# Patient Record
Sex: Female | Born: 1980 | Race: White | Hispanic: No | Marital: Married | Smoking: Current every day smoker
Health system: Southern US, Community
[De-identification: ages and names within clinical notes are randomized; demographics above are authoritative.]

## PROBLEM LIST (undated history)

## (undated) ENCOUNTER — Ambulatory Visit: Admission: EM | Source: Home / Self Care

## (undated) DIAGNOSIS — I509 Heart failure, unspecified: Secondary | ICD-10-CM

## (undated) DIAGNOSIS — J80 Acute respiratory distress syndrome: Secondary | ICD-10-CM

## (undated) DIAGNOSIS — J189 Pneumonia, unspecified organism: Secondary | ICD-10-CM

## (undated) DIAGNOSIS — A419 Sepsis, unspecified organism: Secondary | ICD-10-CM

## (undated) DIAGNOSIS — J9601 Acute respiratory failure with hypoxia: Secondary | ICD-10-CM

## (undated) HISTORY — DX: Acute respiratory failure with hypoxia: J96.01

## (undated) HISTORY — DX: Heart failure, unspecified: I50.9

## (undated) HISTORY — PX: NO PAST SURGERIES: SHX2092

## (undated) HISTORY — DX: Acute respiratory distress syndrome: J80

## (undated) HISTORY — DX: Pneumonia, unspecified organism: J18.9

## (undated) HISTORY — DX: Sepsis, unspecified organism: A41.9

---
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
## 2005-08-08 ENCOUNTER — Emergency Department (HOSPITAL_COMMUNITY): Admission: EM | Admit: 2005-08-08 | Discharge: 2005-08-08 | Payer: Self-pay | Admitting: Emergency Medicine

## 2006-04-11 ENCOUNTER — Emergency Department (HOSPITAL_COMMUNITY): Admission: EM | Admit: 2006-04-11 | Discharge: 2006-04-12 | Payer: Self-pay | Admitting: Emergency Medicine

## 2009-05-01 ENCOUNTER — Emergency Department (HOSPITAL_COMMUNITY): Admission: EM | Admit: 2009-05-01 | Discharge: 2009-05-01 | Payer: Self-pay | Admitting: Family Medicine

## 2009-07-04 ENCOUNTER — Emergency Department (HOSPITAL_COMMUNITY): Admission: EM | Admit: 2009-07-04 | Discharge: 2009-07-05 | Payer: Self-pay | Admitting: Emergency Medicine

## 2010-05-13 LAB — URINALYSIS, ROUTINE W REFLEX MICROSCOPIC
Bilirubin Urine: NEGATIVE
Ketones, ur: NEGATIVE mg/dL
Protein, ur: NEGATIVE mg/dL
Specific Gravity, Urine: 1.004 — ABNORMAL LOW (ref 1.005–1.030)
pH: 6 (ref 5.0–8.0)

## 2010-05-13 LAB — RAPID URINE DRUG SCREEN, HOSP PERFORMED
Amphetamines: NOT DETECTED
Benzodiazepines: NOT DETECTED
Opiates: NOT DETECTED
Tetrahydrocannabinol: NOT DETECTED

## 2010-05-13 LAB — TYPE AND SCREEN
ABO/RH(D): A POS
Antibody Screen: NEGATIVE

## 2010-05-13 LAB — PROTIME-INR
INR: 0.93 (ref 0.00–1.49)
Prothrombin Time: 12.4 seconds (ref 11.6–15.2)

## 2010-05-13 LAB — COMPREHENSIVE METABOLIC PANEL
Alkaline Phosphatase: 104 U/L (ref 39–117)
Calcium: 9.3 mg/dL (ref 8.4–10.5)
GFR calc Af Amer: >60 mL/min (ref >60)
Potassium: 3.2 mEq/L — ABNORMAL LOW (ref 3.5–5.1)
Total Bilirubin: 0.5 mg/dL (ref 0.3–1.2)
Total Protein: 7.6 g/dL (ref 6.0–8.3)

## 2010-05-13 LAB — CBC
Hemoglobin: 15.5 g/dL — ABNORMAL HIGH (ref 12.0–15.0)
MCV: 93.2 fL (ref 78.0–100.0)
Platelets: 263 10*3/uL (ref 150–400)
RDW: 13.3 % (ref 11.5–15.5)
WBC: 12.3 10*3/uL — ABNORMAL HIGH (ref 4.0–10.5)

## 2010-05-13 LAB — POCT I-STAT, CHEM 8
Calcium, Ion: 1.05 mmol/L — ABNORMAL LOW (ref 1.12–1.32)
Hemoglobin: 16.3 g/dL — ABNORMAL HIGH (ref 12.0–15.0)
Potassium: 3.3 mEq/L — ABNORMAL LOW (ref 3.5–5.1)

## 2010-05-13 LAB — DIFFERENTIAL
Basophils Relative: 0 % (ref 0–1)
Eosinophils Relative: 1 % (ref 0–5)
Lymphocytes Relative: 18 % (ref 12–46)
Lymphs Abs: 2.2 10*3/uL (ref 0.7–4.0)
Neutrophils Relative %: 74 % (ref 43–77)

## 2010-05-13 LAB — ETHANOL: Alcohol, Ethyl (B): 93 mg/dL — ABNORMAL HIGH (ref 0–10)

## 2010-05-13 LAB — ABO/RH: ABO/RH(D): A POS

## 2010-11-11 ENCOUNTER — Emergency Department (HOSPITAL_COMMUNITY)
Admission: EM | Admit: 2010-11-11 | Discharge: 2010-11-11 | Disposition: A | Discharge status: Home / Self Care | Payer: Self-pay | Attending: Emergency Medicine | Admitting: Emergency Medicine

## 2010-11-11 DIAGNOSIS — R21 Rash and other nonspecific skin eruption: Secondary | ICD-10-CM | POA: Insufficient documentation

## 2010-11-11 DIAGNOSIS — K089 Disorder of teeth and supporting structures, unspecified: Secondary | ICD-10-CM | POA: Insufficient documentation

## 2011-12-21 ENCOUNTER — Emergency Department (HOSPITAL_COMMUNITY)
Admission: EM | Admit: 2011-12-21 | Discharge: 2011-12-21 | Disposition: A | Discharge status: Home / Self Care | Payer: Self-pay | Attending: Emergency Medicine | Admitting: Emergency Medicine

## 2011-12-21 ENCOUNTER — Encounter (HOSPITAL_COMMUNITY): Payer: Self-pay | Admitting: Emergency Medicine

## 2011-12-21 DIAGNOSIS — F17200 Nicotine dependence, unspecified, uncomplicated: Secondary | ICD-10-CM | POA: Insufficient documentation

## 2011-12-21 DIAGNOSIS — K029 Dental caries, unspecified: Secondary | ICD-10-CM | POA: Insufficient documentation

## 2011-12-21 DIAGNOSIS — K089 Disorder of teeth and supporting structures, unspecified: Secondary | ICD-10-CM | POA: Insufficient documentation

## 2011-12-21 DIAGNOSIS — K0889 Other specified disorders of teeth and supporting structures: Secondary | ICD-10-CM

## 2011-12-21 MED ORDER — OXYCODONE-ACETAMINOPHEN 5-325 MG PO TABS
2.0000 | ORAL_TABLET | Freq: Once | ORAL | Status: AC
  Administered 2011-12-21: 2 via ORAL
  Filled 2011-12-21: qty 2

## 2011-12-21 MED ORDER — OXYCODONE-ACETAMINOPHEN 5-325 MG PO TABS: 1.0000 | ORAL_TABLET | Freq: Four times a day (QID) | ORAL | Status: DC | PRN

## 2011-12-21 MED ORDER — PENICILLIN V POTASSIUM 500 MG PO TABS: 500.0000 mg | ORAL_TABLET | Freq: Four times a day (QID) | ORAL | Status: AC

## 2011-12-21 NOTE — ED Provider Notes (Signed)
History  This chart was scribed for non-physician practitioner working with Juliet Rude. Rubin Payor, MD by Ardeen Jourdain. This patient was seen in room WTR8/WTR8 and the patient's care was started at 1429.  CSN: [id number]  Arrival date & time 12/21/11  1401   First MD Initiated Contact with Patient 12/21/11 1429      Chief Complaint  Patient presents with  . Dental Pain    The history is provided by the patient. No language interpreter was used.    English Catherine Cannon is a 31 y.o. female who presents to the Emergency Department complaining of dental pain on the left side and upper jaw of her mouth that started 3 weeks ago. She states that the pain radiates to her head and neck. She also states that she had a tooth fall out a few weeks ago. She denies taking any medication for the pain, which she rates at 8-9. She is a current everyday smoker and occasional alcohol user.   History reviewed. No pertinent past medical history.  History reviewed. No pertinent past surgical history.  History reviewed. No pertinent family history.  History  Substance Use Topics  . Smoking status: Current Every Day Smoker  . Smokeless tobacco: Not on file  . Alcohol Use: Yes   No OB History available.   Review of Systems  Constitutional: Negative for fever and chills.  HENT: Positive for dental problem.     Allergies  Review of patient's allergies indicates no known allergies.  Home Medications   Current Outpatient Rx  Name Route Sig Dispense Refill  . IBUPROFEN 200 MG PO TABS Oral Take 800 mg by mouth every 6 (six) hours as needed.      Triage Vitals: BP 138/87  Pulse 78  Temp 98.5 F (36.9 C) (Oral)  Resp 18  SpO2 95%  LMP 11/23/2011  Physical Exam  Nursing note and vitals reviewed. Constitutional: She is oriented to person, place, and time. She appears well-developed and well-nourished. No distress.  HENT:  Head: Normocephalic and atraumatic. No trismus in the jaw.  Mouth/Throat:  Oropharynx is clear and moist. Abnormal dentition. Dental caries present. No dental abscesses or uvula swelling. No oropharyngeal exudate.         No oropharyngeal swelling, uvula midline, no swelling under the tongue.  Eyes: EOM are normal. Pupils are equal, round, and reactive to light.  Neck: Normal range of motion. Neck supple. No tracheal deviation present.  Cardiovascular: Normal rate, regular rhythm and normal heart sounds.   Pulmonary/Chest: Effort normal and breath sounds normal. No respiratory distress.  Abdominal: Soft. Bowel sounds are normal. She exhibits no distension. There is no tenderness.  Musculoskeletal: Normal range of motion. She exhibits no edema.  Neurological: She is alert and oriented to person, place, and time.  Skin: Skin is warm and dry.  Psychiatric: She has a normal mood and affect. Her behavior is normal.    ED Course  Procedures (including critical care time) DIAGNOSTIC STUDIES: Oxygen Saturation is 95% on room air, adequate by my interpretation.    COORDINATION OF CARE:  3:20 PM: Discussed treatment plan which includes consultation with the dentist on call with pt at bedside and pt agreed to plan.    Labs Reviewed - No data to display No results found.   1. Pain, dental       MDM  Patient presented with dental pain. Patient given pain medication with improvement. Patient discharged on penicillin, pain meds, and given referral to dentist on call.  Return precautions given. No red flags for Ludwig's angina.            Pixie Casino, PA-C 12/21/11 1733

## 2011-12-21 NOTE — ED Notes (Signed)
Pt verbalizes understanding 

## 2011-12-21 NOTE — ED Notes (Signed)
Pt c/o pain to 2 left upper molars for several months.  Pt does not have a dentist.  Pt has not taken anything for pain.  No facial swelling noted.

## 2011-12-21 NOTE — Progress Notes (Signed)
CM spoke with pt who confirms self pay [place] resident with no pcp. Discussed the importance of a pcp for f/u. Reviewed Health connect number to assist with finding self pay provider close to pt's residence. Reviewed resources for [facility], general medical clinics, medications-needymeds.com, [facility] out [facility], housing, DSS, health Department and other resources in [employer] [employer]. Pt voiced understanding and appreciation of resources provided

## 2011-12-22 NOTE — ED Provider Notes (Signed)
Medical screening examination/treatment/procedure(s) were performed by non-physician practitioner and as supervising physician I was immediately available for consultation/collaboration  Omarian Jaquith R. Viviano Bir, MD 12/22/11 0012 

## 2012-09-15 ENCOUNTER — Emergency Department (HOSPITAL_COMMUNITY)
Admission: EM | Admit: 2012-09-15 | Discharge: 2012-09-15 | Disposition: A | Discharge status: Home / Self Care | Payer: Self-pay | Attending: Emergency Medicine | Admitting: Emergency Medicine

## 2012-09-15 ENCOUNTER — Encounter (HOSPITAL_COMMUNITY): Payer: Self-pay | Admitting: Emergency Medicine

## 2012-09-15 DIAGNOSIS — R220 Localized swelling, mass and lump, head: Secondary | ICD-10-CM | POA: Insufficient documentation

## 2012-09-15 DIAGNOSIS — K047 Periapical abscess without sinus: Secondary | ICD-10-CM | POA: Insufficient documentation

## 2012-09-15 DIAGNOSIS — F17200 Nicotine dependence, unspecified, uncomplicated: Secondary | ICD-10-CM | POA: Insufficient documentation

## 2012-09-15 MED ORDER — OXYCODONE-ACETAMINOPHEN 5-325 MG PO TABS: 1.0000 | ORAL_TABLET | Freq: Four times a day (QID) | ORAL | Status: DC | PRN

## 2012-09-15 MED ORDER — OXYCODONE-ACETAMINOPHEN 5-325 MG PO TABS
1.0000 | ORAL_TABLET | Freq: Once | ORAL | Status: AC
  Administered 2012-09-15: 1 via ORAL
  Filled 2012-09-15: qty 1

## 2012-09-15 MED ORDER — PENICILLIN V POTASSIUM 500 MG PO TABS: 500.0000 mg | ORAL_TABLET | Freq: Three times a day (TID) | ORAL | Status: DC

## 2012-09-15 MED ORDER — IBUPROFEN 800 MG PO TABS: 800.0000 mg | ORAL_TABLET | Freq: Three times a day (TID) | ORAL | Status: DC

## 2012-09-15 NOTE — ED Provider Notes (Signed)
  CSN: [id number]     Arrival date & time 09/15/12  1553 History    This chart was scribed for non-physician practitioner Arnoldo Hooker PA-C working with Flint Melter, MD by Donne Anon, ED Scribe. This patient was seen in room WTR5/WTR5 and the patient's care was started at 1607.    First MD Initiated Contact with Patient 09/15/12 1607     No chief complaint on file.   The history is provided by the patient. No language interpreter was used.   HPI Comments: Catherine Cannon is a 32 y.o. female who presents to the Emergency Department complaining of 4 days of gradual onset, gradually worsening left sided dental pain and swelling. She states the tooth has been broken, but she has not sought dental care due to lack of insurance. She has tried ibuprofen with little relief. She denies fever or any other pain or injury.   No past medical history on file. No past surgical history on file. No family history on file. History  Substance Use Topics  . Smoking status: Current Every Day Smoker  . Smokeless tobacco: Not on file  . Alcohol Use: Yes   OB History   Grav Para Term Preterm Abortions TAB SAB Ect Mult Living                 Review of Systems  Constitutional: Negative for fever.  HENT: Positive for dental problem. Negative for drooling and trouble swallowing.   All other systems reviewed and are negative.    Allergies  Review of patient's allergies indicates no known allergies.  Home Medications   Current Outpatient Rx  Name  Route  Sig  Dispense  Refill  . ibuprofen (ADVIL,MOTRIN) 200 MG tablet   Oral   Take 800 mg by mouth every 6 (six) hours as needed.         Marland Kitchen oxyCODONE-acetaminophen (PERCOCET) 5-325 MG per tablet   Oral   Take 1 tablet by mouth every 6 (six) hours as needed for pain.   15 tablet   0    There were no vitals taken for this visit.  Physical Exam  Nursing note and vitals reviewed. Constitutional: She appears well-developed and  well-nourished. No distress.  HENT:  Head: Normocephalic and atraumatic.  Mouth/Throat: Oropharynx is clear and moist.  Severe erosion to mid upper left molar with moderate decay of remainder teeth. Left sided facial swelling. No discrete abscess visualized. No submental lymphadenopathy.   Eyes: Conjunctivae are normal.  Neck: Neck supple. No tracheal deviation present.  Cardiovascular: Normal rate.   Pulmonary/Chest: Effort normal. No respiratory distress.  Musculoskeletal: Normal range of motion.  Neurological: She is alert.  Skin: Skin is warm and dry.  Psychiatric: She has a normal mood and affect. Her behavior is normal.    ED Course   Procedures (including critical care time) DIAGNOSTIC STUDIES: .    COORDINATION OF CARE: 4:14 PM Discussed treatment plan which includes pain medication with pt at bedside and pt agreed to plan. Dental referral given. Advised pt to use warm compresses and ibuprofen.     Labs Reviewed - No data to display No results found. No diagnosis found. 1. Dental abscess MDM  Facial swelling and molar pain c/w dental abscess.  I personally performed the services described in this documentation, which was scribed in my presence. The recorded information has been reviewed and is accurate.     Arnoldo Hooker, PA-C 09/15/12 1623

## 2012-09-15 NOTE — ED Provider Notes (Signed)
Medical screening examination/treatment/procedure(s) were performed by non-physician practitioner and as supervising physician I was immediately available for consultation/collaboration.  Katia Hannen L Keshara Kiger, MD 09/15/12 2341 

## 2012-09-15 NOTE — ED Notes (Signed)
4 day hx of dental pain up upper l/side of mouth

## 2013-02-24 ENCOUNTER — Encounter (HOSPITAL_COMMUNITY): Payer: Self-pay | Admitting: Emergency Medicine

## 2013-02-24 ENCOUNTER — Emergency Department (HOSPITAL_COMMUNITY)
Admission: EM | Admit: 2013-02-24 | Discharge: 2013-02-24 | Disposition: A | Discharge status: Home / Self Care | Payer: Self-pay | Attending: Emergency Medicine | Admitting: Emergency Medicine

## 2013-02-24 DIAGNOSIS — R6889 Other general symptoms and signs: Secondary | ICD-10-CM

## 2013-02-24 DIAGNOSIS — Z79899 Other long term (current) drug therapy: Secondary | ICD-10-CM | POA: Insufficient documentation

## 2013-02-24 DIAGNOSIS — F17200 Nicotine dependence, unspecified, uncomplicated: Secondary | ICD-10-CM | POA: Insufficient documentation

## 2013-02-24 DIAGNOSIS — J111 Influenza due to unidentified influenza virus with other respiratory manifestations: Secondary | ICD-10-CM | POA: Insufficient documentation

## 2013-02-24 LAB — RAPID STREP SCREEN (MED CTR MEBANE ONLY): STREPTOCOCCUS, GROUP A SCREEN (DIRECT): NEGATIVE

## 2013-02-24 MED ORDER — TRAMADOL HCL 50 MG PO TABS: 50.0000 mg | ORAL_TABLET | Freq: Four times a day (QID) | ORAL | Status: DC | PRN

## 2013-02-24 MED ORDER — DEXTROMETHORPHAN POLISTIREX 30 MG/5ML PO LQCR: 15.0000 mg | Freq: Two times a day (BID) | ORAL | Status: DC

## 2013-02-24 NOTE — ED Provider Notes (Signed)
Medical screening examination/treatment/procedure(s) were performed by non-physician practitioner and as supervising physician I was immediately available for consultation/collaboration.  EKG Interpretation   None         Houston Siren, MD 02/24/13 1704

## 2013-02-24 NOTE — Discharge Instructions (Signed)

## 2013-02-24 NOTE — ED Provider Notes (Signed)
CSN: [id number]     Arrival date & time 02/24/13  1334 History  This chart was scribed for non-physician practitioner, Devona Konig, working with Houston Siren, MD by Celesta Gentile, ED Scribe. This patient was seen in room WTR6/WTR6 and the patient's care was started at 2:15 PM.    Chief Complaint  Patient presents with  . Headache  . Chills  . Cough  . Sore Throat    x 1 week   The history is provided by the patient. No language interpreter was used.   HPI Comments: Catherine Cannon is a 33 y.o. female who presents to the Emergency Department complaining of worsening flu-like symptoms that started about 7 days ago.  She states she has a constant headache, subjective fever, chills, non productive cough, sore throat, and nasal congestion.  She states she did not receive a flu shot this year.  She has tried DayQuil, but denies trying OTC nasal sprays.  Pt reports she had a sick contact at work.  History reviewed. No pertinent past medical history. History reviewed. No pertinent past surgical history. History reviewed. No pertinent family history. History  Substance Use Topics  . Smoking status: Current Every Day Smoker    Types: Cigarettes  . Smokeless tobacco: Not on file  . Alcohol Use: Yes   OB History   Grav Para Term Preterm Abortions TAB SAB Ect Mult Living                 Review of Systems  Constitutional: Positive for fever and chills.  HENT: Positive for congestion, sinus pressure and sore throat. Negative for rhinorrhea.   Respiratory: Positive for cough. Negative for shortness of breath.   Cardiovascular: Negative for chest pain.  Gastrointestinal: Negative for nausea, vomiting, abdominal pain and diarrhea.  Musculoskeletal: Negative for back pain.  Skin: Negative for color change and rash.  Neurological: Positive for headaches. Negative for syncope.  All other systems reviewed and are negative.    Allergies  Review of patient's allergies indicates no known  allergies.  Home Medications   Current Outpatient Rx  Name  Route  Sig  Dispense  Refill  . ibuprofen (ADVIL,MOTRIN) 200 MG tablet   Oral   Take 800 mg by mouth every 6 (six) hours as needed.         . Pseudoeph-Doxylamine-DM-APAP (NYQUIL PO)   Oral   Take 2 tablets by mouth daily.         Marland Kitchen dextromethorphan (DELSYM) 30 MG/5ML liquid   Oral   Take 2.5-5 mLs (15-30 mg total) by mouth 2 (two) times daily.   89 mL   0   . traMADol (ULTRAM) 50 MG tablet   Oral   Take 1 tablet (50 mg total) by mouth every 6 (six) hours as needed.   10 tablet   0    Triage Vitals: BP 137/94  Pulse 82  Temp(Src) 98.4 F (36.9 C) (Oral)  Resp 16  SpO2 97%  LMP 02/10/2013 Physical Exam  Nursing note and vitals reviewed. Constitutional: She is oriented to person, place, and time. She appears well-developed and well-nourished. No distress.  HENT:  Head: Normocephalic and atraumatic.  Right Ear: External ear normal.  Left Ear: External ear normal.  Nose: Nose normal.  Mouth/Throat: Posterior oropharyngeal erythema present.  Positive nasal congestion.   Eyes: Conjunctivae and EOM are normal.  Neck: Neck supple. No tracheal deviation present.  Cardiovascular: Normal rate.   Pulmonary/Chest: Effort normal. No respiratory distress.  Musculoskeletal:  Normal range of motion.  Neurological: She is alert and oriented to person, place, and time.  Skin: Skin is warm and dry.  Psychiatric: She has a normal mood and affect. Her behavior is normal.    ED Course  Procedures (including critical care time) DIAGNOSTIC STUDIES: Oxygen Saturation is 97% on RA, normal by my interpretation.    COORDINATION OF CARE: 2:18 PM-Will order Strep test.  Patient informed of current plan of treatment and evaluation and agrees with plan.    2:42PM-Will discharge with tramadol and Delsym.    Labs Review Labs Reviewed  RAPID STREP SCREEN  CULTURE, GROUP A STREP    EKG Interpretation   None        MDM   1. Flu-like symptoms     Patient has negative strep screen. I highly doubt bacterial etiology. Will withhold abx at this time. Patient advised to maintain hydration by drinking small amounts of clear fluids frequently, then soft diet, and then advance diet as tolerated. May use OTC Imodium if desired for any diarrhea.  Call if symptoms worsen, high fever, severe weakness or fainting, increased abdominal pain, blood in stool or vomit, or failure to improve in 2-3 days.     33 y.o.Lauraine Carlile's evaluation in the Emergency Department is complete. It has been determined that no acute conditions requiring further emergency intervention are present at this time. The patient/guardian have been advised of the diagnosis and plan. We have discussed signs and symptoms that warrant return to the ED, such as changes or worsening in symptoms.  Vital signs are stable at discharge. Filed Vitals:   02/24/13 1343  BP: 137/94  Pulse: 82  Temp: 98.4 F (36.9 C)  Resp: 16    Patient/guardian has voiced understanding and agreed to follow-up with the PCP or specialist.  I personally performed the services described in this documentation, which was scribed in my presence. The recorded information has been reviewed and is accurate }   Linus Mako, PA-C 02/24/13 1521  Linus Mako, PA-C 02/24/13 1524

## 2013-02-24 NOTE — ED Notes (Signed)
Pt reports cough, headache and sore throat x 1 week. Reports "suffy nose" x 1 month

## 2013-02-26 LAB — CULTURE, GROUP A STREP

## 2013-05-22 ENCOUNTER — Emergency Department (HOSPITAL_COMMUNITY)
Admission: EM | Admit: 2013-05-22 | Discharge: 2013-05-22 | Disposition: A | Discharge status: Home / Self Care | Payer: Self-pay | Attending: Emergency Medicine | Admitting: Emergency Medicine

## 2013-05-22 ENCOUNTER — Encounter (HOSPITAL_COMMUNITY): Payer: Self-pay | Admitting: Emergency Medicine

## 2013-05-22 DIAGNOSIS — F17200 Nicotine dependence, unspecified, uncomplicated: Secondary | ICD-10-CM | POA: Insufficient documentation

## 2013-05-22 DIAGNOSIS — K029 Dental caries, unspecified: Secondary | ICD-10-CM | POA: Insufficient documentation

## 2013-05-22 DIAGNOSIS — K089 Disorder of teeth and supporting structures, unspecified: Secondary | ICD-10-CM | POA: Insufficient documentation

## 2013-05-22 DIAGNOSIS — K0889 Other specified disorders of teeth and supporting structures: Secondary | ICD-10-CM

## 2013-05-22 MED ORDER — NAPROXEN 500 MG PO TABS: 500.0000 mg | ORAL_TABLET | Freq: Two times a day (BID) | ORAL | Status: DC

## 2013-05-22 MED ORDER — HYDROCODONE-ACETAMINOPHEN 5-325 MG PO TABS
1.0000 | ORAL_TABLET | Freq: Once | ORAL | Status: AC
  Administered 2013-05-22: 1 via ORAL
  Filled 2013-05-22: qty 1

## 2013-05-22 MED ORDER — PENICILLIN V POTASSIUM 500 MG PO TABS
500.0000 mg | ORAL_TABLET | Freq: Once | ORAL | Status: AC
  Administered 2013-05-22: 500 mg via ORAL
  Filled 2013-05-22: qty 1

## 2013-05-22 MED ORDER — PENICILLIN V POTASSIUM 500 MG PO TABS: 500.0000 mg | ORAL_TABLET | Freq: Four times a day (QID) | ORAL | Status: AC

## 2013-05-22 NOTE — ED Provider Notes (Signed)
CSN: [id number]     Arrival date & time 05/22/13  1823 History  This chart was scribed for non-physician practitioner, Antonietta Breach, PA-C, working with Jasper Riling. Alvino Chapel, MD by Roe Coombs, ED Scribe. This patient was seen in room WTR7/WTR7 and the patient's care was started at 8:39 PM.   Chief Complaint  Patient presents with  . Dental Pain    The history is provided by the patient. No language interpreter was used.    HPI Comments: Catherine Cannon is a 33 y.o. female who presents to the Emergency Department complaining of constant throbbing right upper dental pain that radiates up the right side of her face onset 3-4 days ago. There is associated intermittent right sided facial swelling. Patient states that her tooth is broken and now infected, and she has hd intermittent dental pain resulting from this. She has taken ibuprofen for this episode with transient relief. She denies bleeding or drainage in her mouth, trismus, or dysphagia. There is no fever. Patient has no chronic medical conditions.    History reviewed. No pertinent past medical history. History reviewed. No pertinent past surgical history. No family history on file. History  Substance Use Topics  . Smoking status: Current Every Day Smoker    Types: Cigarettes  . Smokeless tobacco: Not on file  . Alcohol Use: Yes   OB History   Grav Para Term Preterm Abortions TAB SAB Ect Mult Living                 Review of Systems  Constitutional: Negative for fever.  HENT: Positive for dental problem. Negative for trouble swallowing.   All other systems reviewed and are negative.      Allergies  Review of patient's allergies indicates no known allergies.  Home Medications   Current Outpatient Rx  Name  Route  Sig  Dispense  Refill  . ibuprofen (ADVIL,MOTRIN) 200 MG tablet   Oral   Take 400 mg by mouth every 6 (six) hours as needed for headache.           Triage Vitals: BP 152/95  Pulse 85  Temp(Src) 98.2 F  (36.8 C) (Oral)  Resp 16  SpO2 100%  LMP 05/01/2013  Physical Exam  Nursing note and vitals reviewed. Constitutional: She is oriented to person, place, and time. She appears well-developed and well-nourished. No distress.  HENT:  Head: Normocephalic and atraumatic.  Right Ear: External ear normal. No mastoid tenderness.  Left Ear: External ear normal. No mastoid tenderness.  Nose: Nose normal.  Mouth/Throat: Uvula is midline, oropharynx is clear and moist and mucous membranes are normal. No oral lesions. No trismus in the jaw. Abnormal dentition. Dental caries present. No dental abscesses (No significant area of fluctuance) or uvula swelling. No oropharyngeal exudate.  Oropharynx clear. Patient tolerating secretions without difficulty or drooling. Uvula midline. No stridor. No trismus.  Eyes: Conjunctivae and EOM are normal. Pupils are equal, round, and reactive to light. No scleral icterus.  Neck: Normal range of motion. Neck supple.  Pulmonary/Chest: Effort normal. No respiratory distress.  Musculoskeletal: Normal range of motion.  Lymphadenopathy:    She has no cervical adenopathy.  Neurological: She is alert and oriented to person, place, and time.  Skin: Skin is warm and dry. No rash noted. She is not diaphoretic. No erythema. No pallor.  Psychiatric: She has a normal mood and affect. Her behavior is normal.    ED Course  Procedures (including critical care time) DIAGNOSTIC STUDIES: Oxygen Saturation is  100% on room air, normal by my interpretation.    COORDINATION OF CARE: 8:45 PM- Patient informed of current plan for treatment and evaluation and agrees with plan at this time.     MDM   Final diagnoses:  Dentalgia    Patient with dentalgia; recurrent with most recent onset 3 days ago. No gross abscess. Uvula midline. No trismus or stridor. Exam today not concerning for Ludwig's angina or spread of infection. Will treat with penicillin and Naproxen. Urged patient to  follow-up with dentist. Resource guide and dental referral provided. Return precautions discussed and patient agreeable to plan with no unaddressed concerns.  I personally performed the services described in this documentation, which was scribed in my presence. The recorded information has been reviewed and is accurate.   Filed Vitals:   05/22/13 1831 05/22/13 2105  BP: 152/95 124/96  Pulse: 85 78  Temp: 98.2 F (36.8 C)   TempSrc: Oral   Resp: 16 16  SpO2: 100% 100%       Antonietta Breach, PA-C 05/22/13 2316

## 2013-05-22 NOTE — ED Notes (Signed)
per pt, states dental pain for 3 days-broke off awhile ago-has no insurance

## 2013-05-22 NOTE — Discharge Instructions (Signed)
Dental Pain °A tooth ache may be caused by cavities (tooth decay). Cavities expose the nerve of the tooth to air and hot or cold temperatures. It may come from an infection or abscess (also called a boil or furuncle) around your tooth. It is also often caused by dental caries (tooth decay). This causes the pain you are having. °DIAGNOSIS  °Your caregiver can diagnose this problem by exam. °TREATMENT  °· If caused by an infection, it may be treated with medications which kill germs (antibiotics) and pain medications as prescribed by your caregiver. Take medications as directed. °· Only take over-the-counter or prescription medicines for pain, discomfort, or fever as directed by your caregiver. °· Whether the tooth ache today is caused by infection or dental disease, you should see your dentist as soon as possible for further care. °SEEK MEDICAL CARE IF: °The exam and treatment you received today has been provided on an emergency basis only. This is not a substitute for complete medical or dental care. If your problem worsens or new problems (symptoms) appear, and you are unable to meet with your dentist, call or return to this location. °SEEK IMMEDIATE MEDICAL CARE IF:  °· You have a fever. °· You develop redness and swelling of your face, jaw, or neck. °· You are unable to open your mouth. °· You have severe pain uncontrolled by pain medicine. °MAKE SURE YOU:  °· Understand these instructions. °· Will watch your condition. °· Will get help right away if you are not doing well or get worse. °Document Released: 02/09/2005 Document Revised: 05/04/2011 Document Reviewed: 09/28/2007 °ExitCare® Patient Information ©2014 [employer]. °  Emergency Department Resource Guide °1) Find a Doctor and Pay Out of Pocket °Although you won't have to find out who is covered by your insurance plan, it is a good idea to ask around and get recommendations. You will then need to call the office and see if the doctor you have chosen will  accept you as a new patient and what types of options they offer for patients who are self-pay. Some doctors offer discounts or will set up payment plans for their patients who do not have insurance, but you will need to ask so you aren't surprised when you get to your appointment. ° °2) Contact Your Local Health Department °Not all health departments have doctors that can see patients for sick visits, but many do, so it is worth a call to see if yours does. If you don't know where your local health department is, you can check in your phone book. The CDC also has a tool to help you locate your state's health department, and many state websites also have listings of all of their local health departments. ° °3) Find a Walk-in Clinic °If your illness is not likely to be very severe or complicated, you may want to try a walk in clinic. These are popping up all over the country in pharmacies, drugstores, and shopping centers. They're usually staffed by nurse practitioners or physician assistants that have been trained to treat common illnesses and complaints. They're usually fairly quick and inexpensive. However, if you have serious medical issues or chronic medical problems, these are probably not your best option. ° °No Primary Care Doctor: °- Call Health Connect at  832-8000 - they can help you locate a primary care doctor that  accepts your insurance, provides certain services, etc. °- Physician Referral Service- 1-800-533-3463 ° °Chronic Pain Problems: °Organization           Address  Phone   Notes  [facility]  [phone] Patients need to be referred by their primary care doctor.   Medication Assistance: Organization         Address  Phone   Notes  [facility] [address] [place], [place] [address] [phone] --Must be a resident of [facility] -- Must have NO insurance coverage whatsoever (no Medicaid/ Medicare, etc.) -- The pt.  MUST have a primary care doctor that directs their care regularly and follows them in the community   [facility]  [phone]   [employer]  [phone]    Agencies that provide inexpensive medical care: Organization         Address  Phone   Notes  [place]  [phone]   [facility]    [phone]   [facility] [place], [place] [address] [phone]   [place] [address] [place] [phone]   Planned Parenthood    [phone]   [facility]    [phone]   [place] [address], [place] Phone:  [phone], Fax:  [phone] Hours of Operation:  9 am - 6 pm, M-F.  Also accepts Medicaid/Medicare and self-pay.  [facility] for [place] [address], [address], [place] Phone: [phone], Fax: [phone]. Hours of Operation:  8:30 am - 5:30 pm, M-F.  Also accepts Medicaid and self-pay.  [facility] [place] [address] [place] Phone: [phone]   [place], [place], [place] [phone], Ext. [phone] Mondays & Thursdays: 7-9 AM.  First 15 patients are seen on a first come, first serve basis.    [place] Providers:  Organization         Address  Phone   Notes  [facility] [address], [place] [phone] Also accepts self-pay patients.  [facility] [address], [place]  [phone]   [place], [address], [place] [phone]   [facility] [address], [place] [phone]   Lucianne Lei [address], [place]   [phone] Only accepts [place] patients after they have their name applied to their card.   Self-Pay (no insurance) in  [facility]:  Organization         Address  Phone   Notes  Sickle Cell Patients, [facility] [place] [phone]   [facility] [place] [phone]   [facility] [place]  [place], [place], [place] [phone]   [facility]/Dr. Osei-Bonsu  [address] [place] or [address], [place] [phone] Phone number for both [place] and [place] locations is the same.  [facility] [address] [place] [phone]   [facility] [address] [place] [address], [place] or [address] [phone]   [facility] [address], [place] [phone], phone; [phone], fax Sees patients 1st and 3rd Saturday of every month.  Must not qualify  for public or private insurance (i.e. Medicaid, Medicare, [place] Health Choice, Veterans' Benefits) • Household income should be no more than 200% of the poverty level •The clinic cannot treat you if you are pregnant or think you are pregnant • Sexually transmitted diseases are not treated at the clinic.  ° °Dental Care: °Organization         Address  Phone  Notes  °[facility] [address], [place] [phone] Accepts children up to age 21 who are enrolled in Medicaid or [place] Health Choice; pregnant women with a Medicaid card; and children who have applied for Medicaid or [place] Health Choice, but were declined, whose parents can pay a reduced fee at time of service.  °[facility] [place]  [address], [place] [phone]) [phone] Accepts children up to age 21 who are enrolled in Medicaid or [place] Health Choice; pregnant women with a Medicaid card; and children who have applied for Medicaid or [place] Health Choice, but were declined, whose parents  can pay a reduced fee at time of service.  °[facility] ° [address], [place] ([phone]) [phone] Patients are seen by appointment only. Walk-ins are not accepted. [facility] will see patients 18 years of age and older. °Monday - Tuesday (8am-5pm) °Most Wednesdays (8:30-5pm) °$30 per visit, cash only  °[facility] ° [address], [place] ([phone]) [phone] Patients are seen by appointment only. Walk-ins are not accepted. [facility] will see patients 18 years of age and older. °One Wednesday Evening (Monthly: Volunteer Based).  $30 per visit, cash only  °[facility]  ([phone] for adults; Children under age 4, call Graduate Pediatric Dentistry at [phone]. Children aged 4-14, please call [phone] to request a pediatric application. ° Dental services are provided in all areas of dental care including fillings, crowns and bridges, complete and partial dentures, implants, gum treatment, root canals, and extractions. Preventive care is also provided. Treatment is provided to both adults and children. °Patients are selected via a lottery and there is often a waiting list. °  °[facility] [address], °[place] ° (336) [phone] [email or web address] °  °[facility] [address], [place], [place] (336)[phone], Ext. [phone] Second and Fourth Thursday of each month, opens at 6:30 AM; Clinic ends at 9 AM.  Patients are seen on a first-come first-served basis, and a limited number are seen during each clinic.  ° °[facility] ° [address], [place], [place] ([phone]) [phone]   Eligibility Requirements °You must have lived in [place], [place], or [place] counties for at least the last three months. °  You cannot be eligible for state or federal sponsored healthcare insurance, including Veterans Administration, Medicaid, or Medicare. °  You generally cannot be eligible for healthcare  insurance through your employer.  °  How to apply: °Eligibility screenings are held every Tuesday and Wednesday afternoon from 1:00 pm until 4:00 pm. You do not need an appointment for the interview!  °[facility] [address], [place], [place] [phone]   °[facility]  336-342-8273   °[facility]  [phone]   °[facility]  336-570-6415   ° °Behavioral Health Resources in the Community: °Intensive Outpatient Programs °Organization         Address  Phone    Notes  °[facility] [address], [place], [place] [phone]   °[facility] [address], [place], [place] [phone]   °[facility] [address], [place], [place] ° 336-882-2125   °[facility] [address],  °[place], [place] 1-800-853-5163 or 336-641-4981   °Substance Abuse Resources °Organization         Address  Phone  Notes  °[facility]  336-882-2125   °[facility]  336-784-9470   °[facility]  336-285-9073   °[facility]  336-845-3988   °Residential & Outpatient Substance Abuse Program  1-800-659-3381   °Psychological Services °Organization         Address  Phone  Notes  °[facility]  336- 832-9600   °[facility]  336- 378-7881   °[facility] [address], [place] 1-800-853-5163 or 336-641-4981   ° °Mobile Crisis Teams °Organization         Address  Phone  Notes  °[facility], Mobile Crisis Care Unit  1-877-626-1772   °Assertive °[facility] ° [address] [place], [place] 336-834-9664   °Sharon DeEsch [address] °[place] 336-554-5454   ° °Self-Help/Support Groups °Organization         Address  Phone             Notes  °[facility] [employer] - variety of support groups  336- 373-1402 Call for more information  °Narcotics Anonymous (NA),  [facility] [address], °[place]  2 meetings at this location  ° °Residential Treatment Programs °Organization         Address  Phone  Notes  °[facility] [address],    °[place]  1-866-801-8205   °[facility] ° [address], [place], [place] [phone]   °[facility] [address], [place] 336-845-3988 Admissions: 8am-3pm M-F  °[facility] [address]    °[place], [place] 336-841-1104   °[facility] [address], [place], [place] 336-379-7146   °[facility] [address]  °[place], [place] 336-285-9073   °[facility] Intensive Outpatient [address], [place], [place] 336-852-3033   °[facility] [address]  °[place], [place] 1-877-615-2722 or 336-784-9470   °[facility] ([facility]) [address] , [place] 336-227-7417 Accepts Medicaid  °[facility] [address]  °[place] 1-800-659-3381 Substance Abuse/Addiction Treatment  ° °[place] [facility] Resources °Organization         Address  Phone  Notes  °[facility]  (888) 581-9988   °Julie Brannon, PhD [address] [place], [place]   (336) 349-5553 or (336) 951-0000   °[facility]   [address] °[place], [place] (336) 349-4454   °[facility] [address], [place], [place] (336) 342-8316 Insurance/Medicaid/sponsorship through [facility]  °[facility] [address]                                    [place],  (336) 342-8316 Therapy/tele-psych/case  °[facility] [address]  ° [place],  (336) 349-2233    °Dr. Arfeen  (336) 349-4544   °[facility] [place] [facility]  [employer] [place]   [facility] 1) [address],  °2) [address], [place] °3)  [address], [place] (336) 349-3220 °(336) 342-7768 ° °(336) 342-8140     °[place] [facility] Abuse [facility] (336) 342-1394 or (336) 342-3537 (After Hours)    ° °   °

## 2013-05-23 NOTE — ED Provider Notes (Signed)
Medical screening examination/treatment/procedure(s) were performed by non-physician practitioner and as supervising physician I was immediately available for consultation/collaboration.   EKG Interpretation None       Evoleht Hovatter R. Jeromie Gainor, MD 05/23/13 0022 

## 2013-10-13 ENCOUNTER — Emergency Department (HOSPITAL_COMMUNITY)
Admission: EM | Admit: 2013-10-13 | Discharge: 2013-10-13 | Disposition: A | Discharge status: Home / Self Care | Payer: Self-pay | Attending: Emergency Medicine | Admitting: Emergency Medicine

## 2013-10-13 ENCOUNTER — Encounter (HOSPITAL_COMMUNITY): Payer: Self-pay | Admitting: Emergency Medicine

## 2013-10-13 DIAGNOSIS — W5501XA Bitten by cat, initial encounter: Secondary | ICD-10-CM

## 2013-10-13 DIAGNOSIS — F17200 Nicotine dependence, unspecified, uncomplicated: Secondary | ICD-10-CM | POA: Insufficient documentation

## 2013-10-13 DIAGNOSIS — Y9389 Activity, other specified: Secondary | ICD-10-CM | POA: Insufficient documentation

## 2013-10-13 DIAGNOSIS — S99919A Unspecified injury of unspecified ankle, initial encounter: Secondary | ICD-10-CM

## 2013-10-13 DIAGNOSIS — S81009A Unspecified open wound, unspecified knee, initial encounter: Secondary | ICD-10-CM | POA: Insufficient documentation

## 2013-10-13 DIAGNOSIS — S91009A Unspecified open wound, unspecified ankle, initial encounter: Principal | ICD-10-CM

## 2013-10-13 DIAGNOSIS — S8990XA Unspecified injury of unspecified lower leg, initial encounter: Secondary | ICD-10-CM | POA: Insufficient documentation

## 2013-10-13 DIAGNOSIS — Z791 Long term (current) use of non-steroidal anti-inflammatories (NSAID): Secondary | ICD-10-CM | POA: Insufficient documentation

## 2013-10-13 DIAGNOSIS — S81809A Unspecified open wound, unspecified lower leg, initial encounter: Principal | ICD-10-CM

## 2013-10-13 DIAGNOSIS — Y9289 Other specified places as the place of occurrence of the external cause: Secondary | ICD-10-CM | POA: Insufficient documentation

## 2013-10-13 DIAGNOSIS — IMO0001 Reserved for inherently not codable concepts without codable children: Secondary | ICD-10-CM | POA: Insufficient documentation

## 2013-10-13 DIAGNOSIS — S99929A Unspecified injury of unspecified foot, initial encounter: Secondary | ICD-10-CM

## 2013-10-13 DIAGNOSIS — S81852A Open bite, left lower leg, initial encounter: Secondary | ICD-10-CM

## 2013-10-13 MED ORDER — HYDROCODONE-ACETAMINOPHEN 5-325 MG PO TABS: 1.0000 | ORAL_TABLET | Freq: Four times a day (QID) | ORAL | Status: DC | PRN

## 2013-10-13 MED ORDER — AMOXICILLIN-POT CLAVULANATE 875-125 MG PO TABS
1.0000 | ORAL_TABLET | Freq: Once | ORAL | Status: AC
  Administered 2013-10-13: 1 via ORAL
  Filled 2013-10-13: qty 1

## 2013-10-13 MED ORDER — AMOXICILLIN-POT CLAVULANATE 875-125 MG PO TABS: 1.0000 | ORAL_TABLET | Freq: Two times a day (BID) | ORAL | Status: DC

## 2013-10-13 NOTE — ED Provider Notes (Signed)
CSN: [id number]     Arrival date & time 10/13/13  1819 History   First MD Initiated Contact with Patient 10/13/13 1824     Chief Complaint  Patient presents with  . Animal Bite   The history is provided by the patient. No language interpreter was used.   This chart was scribed for non-physician practitioner Domenic Moras, PA-C working with Wandra Arthurs, MD, by Thea Alken, ED Scribe. This patient was seen in room WTR8/WTR8 and the patient's care was started at 6:25 PM.  Catherine Cannon is a 33 y.o. female who presents to the Emergency Department complaining of an animal bite located to left leg onset 1 week ago. Pt reports she was rough housing with her cat when she was bit on her left lower leg. She reports cat is UTD on immunizations. She now has sharp stabbing pain with improved swelling and tingling in left leg. She rates pain 7/10 that worsens with walking. Pt reports occasional bilateral sore joints in hips. She report initially there were redness to the bite site but it has improved.  She started to notice swelling to her lower leg for the past few days and was worried.  Pt sts "i'm afraid infection may have got into my blood stream".  She denies fever, chills or HA. She denies hx of DM. Pt does not have insurance and denies having an orthopedist. Pt last TDAP was about 5 years ago. Pt is a smoker.   No past medical history on file. No past surgical history on file. No family history on file. History  Substance Use Topics  . Smoking status: Current Every Day Smoker    Types: Cigarettes  . Smokeless tobacco: Not on file  . Alcohol Use: Yes   OB History   Grav Para Term Preterm Abortions TAB SAB Ect Mult Living                 Review of Systems  Constitutional: Negative for fever and chills.  Neurological: Negative for weakness and headaches.    Allergies  Review of patient's allergies indicates no known allergies.  Home Medications   Prior to Admission medications   Medication  Sig Start Date End Date Taking? Authorizing Provider  ibuprofen (ADVIL,MOTRIN) 200 MG tablet Take 400 mg by mouth every 6 (six) hours as needed for headache.     Historical Provider, MD  naproxen (NAPROSYN) 500 MG tablet Take 1 tablet (500 mg total) by mouth 2 (two) times daily. 05/22/13   Antonietta Breach, PA-C   BP 149/88  Pulse 84  Temp(Src) 99 F (37.2 C) (Oral)  Resp 18  SpO2 98% Physical Exam  Nursing note and vitals reviewed. Constitutional: She is oriented to person, place, and time. She appears well-developed and well-nourished. No distress.  HENT:  Head: Normocephalic and atraumatic.  Eyes: Conjunctivae and EOM are normal.  Neck: Neck supple.  Cardiovascular: Normal rate, regular rhythm and normal heart sounds.  Exam reveals no gallop and no friction rub.   No murmur heard. Pulmonary/Chest: Effort normal and breath sounds normal. No respiratory distress. She has no wheezes. She has no rales. She exhibits no tenderness.  Musculoskeletal: Normal range of motion.  Neurological: She is alert and oriented to person, place, and time.  Skin: Skin is warm and dry.  Left lateral mid lower leg-  3 distinct bite marks with surrounding scab and localized bruising. Tender to palpation. No obvious abscess and no surrounding erythema,. There is moderate dependent edema distally  towards ankle and feet with palpable pedal pulses, brisk cap refill.  DTR 2+. No ankle joint involvement   Psychiatric: She has a normal mood and affect. Her behavior is normal.    ED Course  Procedures DIAGNOSTIC STUDIES: Oxygen Saturation is 98% on RA, normal by my interpretation.    COORDINATION OF CARE: 6:49 PM- Pt with cat bite nearly a week.  No obvious systemic involvement.  Bite mark not overlying a joint or deep enough to affect bone. No pus drainage or signs of abscess. She is NVI.  Does have dependent edema from site, which i encourage leg elevation. Does report some joint pain to L ankle, knee, hip however  has FROM.  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  Will start pt on augmentin and recommend close f/u with ortho for further management.  No evidence of compartment syndrome.  NO tendon or synovial involvement.  Pt advised of plan for treatment including antibiotics and pt agrees. Pt has been advised to f/u with an orthopedist.  Pt agrees with plan.  Standard return precaution discussed.  Care discussed with Dr. Angela Nevin Review Labs Reviewed - No data to display  Imaging Review No results found.   EKG Interpretation None      MDM   Final diagnoses:  Cat bite of left lower leg, initial encounter   BP 149/88  Pulse 84  Temp(Src) 99 F (37.2 C) (Oral)  Resp 18  SpO2 98%  LMP 09/23/2013   I personally performed the services described in this documentation, which was scribed in my presence. The recorded information has been reviewed and is accurate.      Domenic Moras, PA-C 10/13/13 1912

## 2013-10-13 NOTE — Discharge Instructions (Signed)
Pasteurella Multocida Infection Pasteurella multocida or P. multocida is a bacteria that can cause infection. Healthy dogs and cats carry these bacteria in their mouths. This kind of infection is usually caused by an animal bite. It can also occur after a dog or cat licks a person's skin that is damaged by a cut or scratch. When people are infected, a bad skin infection usually results. The infection can then spread into bones and tendons. Rarely, the infection can spread to your blood. If this happens, you can develop a heart infection (endocarditis). The bacteria can also cause an infection on the surface of the brain (meningitis). CAUSES  Contact with an animal is usually the cause of this infection. Cats, dogs, poultry (chicken, [place]), and livestock (cow, horse, sheep) can all carry the bacteria. The bacteria may spread to a person through biting, scratching, or licking an open sore. Sometimes, the cause is unknown. SYMPTOMS  Symptoms usually start within 24 hours after contact with an animal. Symptoms may include:  Pain, redness, warmth, and swelling around the bite.  Fluid leaking from the bite area.  Fever.  Joint pain. This can make it hard for you to move.  Bone pain. DIAGNOSIS  To decide if you have a P. multocida infection, your caregiver will probably:  Ask about any recent contact you have had with animals.  Check for signs of infection. This could include:  Taking a sample of fluid leaking from your wound.  Taking a sample of fluid from a joint.  Doing blood tests.  Doing imaging tests. This may include CT scans or an MRI scan. These scans can show if there is an infection in your tendons, joints, or bones. TREATMENT   For a simple skin and soft tissue infection, you may need to take antibiotic medicines for 7 to 10 days. For a worse infection, antibiotics may need to be given for 2 to 6 weeks.  Some infections need to be treated in the hospital. You will be given  antibiotics through an intravenous line (IV). A needle will be put in your hand or arm. The medicine will flow directly into your body through the IV. Initial hospital care is often needed for deep wounds or infections that have spread to the bone, joint, or blood. HOME CARE INSTRUCTIONS  Take your antibiotics as directed. Finish them even if you start to feel better.  Rest at home until your caregiver says it is okay to go back to your normal activities.  Keep all follow-up appointments as directed. This is how your caregiver can make sure your treatment is working. You may need a tetanus shot if:  You cannot remember when you had your last tetanus shot.  You have never had a tetanus shot.  The injury broke your skin. If you get a tetanus shot, your arm may swell, get red, and feel warm to the touch. This is common and not a problem. If you need a tetanus shot and you choose not to have one, there is a rare chance of getting tetanus. Sickness from tetanus can be serious. SEEK IMMEDIATE MEDICAL CARE IF:  Your pain from the wound gets worse.  You develop redness, warmth, or swelling around the wound.  You see fluid or pus leaking from the wound.  You have trouble moving the infected area or develop swelling of a joint.  You develop a bad headache or a stiff neck.  You have chest pain.  You have trouble breathing.  You have  a fever.  You develop side effects from your medicines. MAKE SURE YOU:  Understand these instructions.  Will watch your condition.  Will get help right away if you are not doing well or get worse. Document Released: 10/28/2010 Document Revised: 05/04/2011 Document Reviewed: 10/28/2010 [facility] Patient Information 2015 [place], [place]. This information is not intended to replace advice given to you by your health care provider. Make sure you discuss any questions you have with your health care provider.

## 2013-10-13 NOTE — ED Provider Notes (Signed)
Medical screening examination/treatment/procedure(s) were performed by non-physician practitioner and as supervising physician I was immediately available for consultation/collaboration.   EKG Interpretation None        Wandra Arthurs, MD 10/13/13 2340

## 2013-10-13 NOTE — ED Notes (Signed)
Swelling in l/lower leg 1 week after 3 puncture wounds from her cat. Pt stated that they are indoor cats and shots are up to date

## 2014-02-20 ENCOUNTER — Emergency Department (HOSPITAL_COMMUNITY)
Admission: EM | Admit: 2014-02-20 | Discharge: 2014-02-20 | Disposition: A | Discharge status: Home / Self Care | Payer: Self-pay | Attending: Emergency Medicine | Admitting: Emergency Medicine

## 2014-02-20 ENCOUNTER — Encounter (HOSPITAL_COMMUNITY): Payer: Self-pay | Admitting: Emergency Medicine

## 2014-02-20 DIAGNOSIS — K088 Other specified disorders of teeth and supporting structures: Secondary | ICD-10-CM | POA: Insufficient documentation

## 2014-02-20 DIAGNOSIS — Z791 Long term (current) use of non-steroidal anti-inflammatories (NSAID): Secondary | ICD-10-CM | POA: Insufficient documentation

## 2014-02-20 DIAGNOSIS — K0889 Other specified disorders of teeth and supporting structures: Secondary | ICD-10-CM

## 2014-02-20 DIAGNOSIS — Z720 Tobacco use: Secondary | ICD-10-CM | POA: Insufficient documentation

## 2014-02-20 DIAGNOSIS — Z792 Long term (current) use of antibiotics: Secondary | ICD-10-CM | POA: Insufficient documentation

## 2014-02-20 MED ORDER — TRAMADOL HCL 50 MG PO TABS: 50.0000 mg | ORAL_TABLET | Freq: Four times a day (QID) | ORAL | Status: DC | PRN

## 2014-02-20 MED ORDER — PENICILLIN V POTASSIUM 500 MG PO TABS: 500.0000 mg | ORAL_TABLET | Freq: Four times a day (QID) | ORAL | Status: AC

## 2014-02-20 NOTE — ED Notes (Signed)
Pt has broken tooth to left upper mouth, c/o pain to area.

## 2014-02-20 NOTE — ED Provider Notes (Signed)
CSN: [id number]     Arrival date & time 02/20/14  1450 History  This chart was scribed for Hyman Bible, PA-C with Evelina Bucy, MD by Edison Simon, ED Scribe. This patient was seen in room WTR5/WTR5 and the patient's care was started at 4:01 PM.    Chief Complaint  Patient presents with  . Dental Pain   The history is provided by the patient. No language interpreter was used.    HPI Comments: Catherine Cannon is a 33 y.o. female who presents to the Emergency Department complaining of upper left posterior dental pain with onset 1 week ago. She states it is worsening and radiating to her up to her head. She states the tooth has been chipped for some time and she has not seen a dentist. She states she has been using Ibuprofen with some improvement. She denies fever, chills, nausea, vomiting, or difficulty swallowing.   History reviewed. No pertinent past medical history. History reviewed. No pertinent past surgical history. Family History  Problem Relation Age of Onset  . Asthma Mother   . Cancer Other    History  Substance Use Topics  . Smoking status: Current Every Day Smoker    Types: Cigarettes  . Smokeless tobacco: Not on file  . Alcohol Use: Yes   OB History    No data available     Review of Systems  Constitutional: Negative for fever and chills.  HENT: Positive for dental problem.   Gastrointestinal: Negative for nausea and vomiting.      Allergies  Review of patient's allergies indicates no known allergies.  Home Medications   Prior to Admission medications   Medication Sig Start Date End Date Taking? Authorizing Provider  ibuprofen (ADVIL,MOTRIN) 200 MG tablet Take 400 mg by mouth every 6 (six) hours as needed for headache.    Yes Historical Provider, MD  amoxicillin-clavulanate (AUGMENTIN) 875-125 MG per tablet Take 1 tablet by mouth 2 (two) times daily. Patient not taking: Reported on 02/20/2014 10/13/13   Domenic Moras, PA-C  HYDROcodone-acetaminophen  (NORCO/VICODIN) 5-325 MG per tablet Take 1 tablet by mouth every 6 (six) hours as needed for moderate pain or severe pain. Patient not taking: Reported on 02/20/2014 10/13/13   Domenic Moras, PA-C  naproxen (NAPROSYN) 500 MG tablet Take 1 tablet (500 mg total) by mouth 2 (two) times daily. Patient not taking: Reported on 02/20/2014 05/22/13   Antonietta Breach, PA-C   BP 154/91 mmHg  Pulse 75  Temp(Src) 98 F (36.7 C) (Oral)  Resp 20  SpO2 97%  LMP 01/30/2014 (Approximate) Physical Exam  Constitutional: She is oriented to person, place, and time. She appears well-developed and well-nourished.  HENT:  Head: Normocephalic and atraumatic.  Mouth/Throat: No trismus in the jaw.  Tenderness to palpation of the left upper gingiva No obvious dental abscess No subligual tenderness or swelling No trismus No submental or submandibular lymphadenopathy  Eyes: Conjunctivae are normal.  Neck: Normal range of motion. Neck supple.  Cardiovascular: Normal rate, regular rhythm and normal heart sounds.   Pulmonary/Chest: Effort normal and breath sounds normal.  Musculoskeletal: Normal range of motion.  Neurological: She is alert and oriented to person, place, and time.  Skin: Skin is warm and dry.  Psychiatric: She has a normal mood and affect.  Nursing note and vitals reviewed.   ED Course  Procedures (including critical care time)  DIAGNOSTIC STUDIES: Oxygen Saturation is 97% on room air, normal by my interpretation.    COORDINATION OF CARE: 4:05 PM Discussed  treatment plan with patient at beside, the patient agrees with the plan and has no further questions at this time.   Labs Review Labs Reviewed - No data to display  Imaging Review No results found.   EKG Interpretation None      MDM   Final diagnoses:  None   Patient with toothache.  No gross abscess.  Exam unconcerning for Ludwig's angina or spread of infection.  Will treat with penicillin and pain medicine.  Urged patient to  follow-up with dentist.  Return precautions given.  I personally performed the services described in this documentation, which was scribed in my presence. The recorded information has been reviewed and is accurate.    Hyman Bible, PA-C 02/20/14 1619  Evelina Bucy, MD 02/20/14 [phone]

## 2014-02-20 NOTE — Discharge Instructions (Signed)
You have a dental injury. Use the resource guide listed below to help you find a dentist if you do not already have one to followup with. It is very important that you get evaluated by a dentist as soon as possible. Call tomorrow to schedule an appointment. Use your pain medication as prescribed and do not operate heavy machinery while on pain medication. Take your full course of antibiotics. Read the instructions below.  Eat a soft or liquid diet and rinse your mouth out after meals with warm water. You should see a dentist or return here at once if you have increased swelling, increased pain or uncontrolled bleeding from the site of your injury.   SEEK MEDICAL CARE IF:   You have increased pain not controlled with medicines.   You have swelling around your tooth, in your face or neck.   You have bleeding which starts, continues, or gets worse.   You have a fever >101  If you are unable to open your mouth  RESOURCE GUIDE  Dental Problems  Patients with Medicaid: [place] [address] Phone:  [phone]                                                  Phone:  [phone]  If unable to pay or uninsured, contact:  [facility] or [facility]. to become qualified for the adult dental clinic.  Chronic Pain Problems Contact [facility]  [phone] Patients need to be referred by their primary care doctor.  Insufficient Money for Medicine Contact [employer]:  call "211" or [place] [phone].  No Primary Care Doctor Call [facility]  [phone] Other agencies that provide inexpensive medical care    [place]  [phone]    [facility]  [place]  [phone]    [facility]  [phone]    [facility]  [place]  [place]  [phone] [place]   [phone] (emergency services [phone]  Substance Abuse Resources [facility]  [phone] [facility] [phone] [facility] [place] [phone] [place] [place] [phone] Residential & Outpatient Substance Abuse Program  [phone]  Abuse/Neglect [place] [phone] [place] [phone] (After Hours)  Emergency [place] [phone]  [place] at the [place] [phone] [place] [phone]  MRSA Hotline #:   606-147-0296    [facility] Dept. [address] [place]  [place] Phone:  [phone]                                   Phone:  [phone]                 Phone:  [place] Phone:  [place] [phone] (After Hours)

## 2014-03-13 ENCOUNTER — Encounter (HOSPITAL_COMMUNITY): Payer: Self-pay | Admitting: Emergency Medicine

## 2014-03-13 ENCOUNTER — Emergency Department (HOSPITAL_COMMUNITY)
Admission: EM | Admit: 2014-03-13 | Discharge: 2014-03-13 | Disposition: A | Discharge status: Home / Self Care | Payer: Self-pay | Attending: Emergency Medicine | Admitting: Emergency Medicine

## 2014-03-13 DIAGNOSIS — J209 Acute bronchitis, unspecified: Secondary | ICD-10-CM | POA: Insufficient documentation

## 2014-03-13 DIAGNOSIS — Z720 Tobacco use: Secondary | ICD-10-CM | POA: Insufficient documentation

## 2014-03-13 NOTE — Discharge Instructions (Signed)
If you were given medicines take as directed.  If you are on coumadin or contraceptives realize their levels and effectiveness is altered by many different medicines.  If you have any reaction (rash, tongues swelling, other) to the medicines stop taking and see a physician.   Please follow up as directed and return to the ER or see a physician for new or worsening symptoms.  Thank you. Filed Vitals:   03/13/14 0913  BP: 151/91  Pulse: 83  Temp: 97.9 F (36.6 C)  TempSrc: Oral  Resp: 16  SpO2: 95%

## 2014-03-13 NOTE — ED Notes (Addendum)
Pt c/o nasal congestion with yellow mucus, productive cough with yellow mucus, sore throat, ear pain onset Wednesday night. On assessment, throat is reddened and inflamed.

## 2014-03-16 NOTE — ED Provider Notes (Signed)
CSN: [id number]     Arrival date & time 03/13/14  0906 History   First MD Initiated Contact with Patient 03/13/14 0912     Chief Complaint  Patient presents with  . Cough  . Nasal Congestion  . Sore Throat     (Consider location/radiation/quality/duration/timing/severity/associated sxs/prior Treatment) HPI Comments: 34 year old female presents with nasal congestion, productive yellow cough, sore throat, ear pressure since Wednesday night. No significant sick contacts, no travel. Patient has no immunosuppression history is healthy otherwise. Patient is a current cigarette smoker. Her symptoms are fairly constant.  Patient is a 34 y.o. female presenting with cough and pharyngitis. The history is provided by the patient.  Cough Associated symptoms: sore throat   Associated symptoms: no chest pain, no chills, no fever, no headaches, no rash and no shortness of breath   Sore Throat Pertinent negatives include no chest pain, no abdominal pain, no headaches and no shortness of breath.    History reviewed. No pertinent past medical history. History reviewed. No pertinent past surgical history. Family History  Problem Relation Age of Onset  . Asthma Mother   . Cancer Other    History  Substance Use Topics  . Smoking status: Current Every Day Smoker    Types: Cigarettes  . Smokeless tobacco: Not on file  . Alcohol Use: Yes   OB History    No data available     Review of Systems  Constitutional: Positive for appetite change. Negative for fever and chills.  HENT: Positive for congestion and sore throat.   Eyes: Negative for visual disturbance.  Respiratory: Positive for cough. Negative for shortness of breath.   Cardiovascular: Negative for chest pain.  Gastrointestinal: Negative for vomiting and abdominal pain.  Genitourinary: Negative for dysuria and flank pain.  Musculoskeletal: Negative for back pain, neck pain and neck stiffness.  Skin: Negative for rash.  Neurological:  Negative for light-headedness and headaches.      Allergies  Review of patient's allergies indicates no known allergies.  Home Medications   Prior to Admission medications   Medication Sig Start Date End Date Taking? Authorizing Provider  ibuprofen (ADVIL,MOTRIN) 200 MG tablet Take 400 mg by mouth every 6 (six) hours as needed for headache.    Yes Historical Provider, MD  Pseudoephedrine-DM-GG-APAP 30-15-200-325 MG TABS Take 2 tablets by mouth every 6 (six) hours as needed (for cold).   Yes Historical Provider, MD  amoxicillin-clavulanate (AUGMENTIN) 875-125 MG per tablet Take 1 tablet by mouth 2 (two) times daily. Patient not taking: Reported on 02/20/2014 10/13/13   Domenic Moras, PA-C  HYDROcodone-acetaminophen (NORCO/VICODIN) 5-325 MG per tablet Take 1 tablet by mouth every 6 (six) hours as needed for moderate pain or severe pain. Patient not taking: Reported on 02/20/2014 10/13/13   Domenic Moras, PA-C  naproxen (NAPROSYN) 500 MG tablet Take 1 tablet (500 mg total) by mouth 2 (two) times daily. Patient not taking: Reported on 02/20/2014 05/22/13   Antonietta Breach, PA-C  traMADol (ULTRAM) 50 MG tablet Take 1 tablet (50 mg total) by mouth every 6 (six) hours as needed. Patient not taking: Reported on 03/13/2014 02/20/14   Heather Laisure, PA-C   BP 151/91 mmHg  Pulse 83  Temp(Src) 97.9 F (36.6 C) (Oral)  Resp 16  SpO2 95%  LMP 01/30/2014 (Approximate) Physical Exam  Constitutional: She is oriented to person, place, and time. She appears well-developed and well-nourished.  HENT:  Head: Normocephalic and atraumatic.  No exudate or swelling posterior pharynx. Nasal congestion.  Eyes: Conjunctivae  are normal. Right eye exhibits no discharge. Left eye exhibits no discharge.  Neck: Normal range of motion. Neck supple. No tracheal deviation present.  Cardiovascular: Normal rate and regular rhythm.   Pulmonary/Chest: Effort normal and breath sounds normal.  Abdominal: Soft. She exhibits no  distension. There is no tenderness. There is no guarding.  Musculoskeletal: She exhibits no edema.  Neurological: She is alert and oriented to person, place, and time.  Skin: Skin is warm. No rash noted.  Psychiatric: She has a normal mood and affect.  Nursing note and vitals reviewed.   ED Course  Procedures (including critical care time) Labs Review Labs Reviewed - No data to display  Imaging Review No results found.   EKG Interpretation None      MDM   Final diagnoses:  Acute bronchitis, unspecified organism   Well-appearing healthy female presents with viral respiratory symptoms. Lungs are clear no respirator difficulty. Patient's healthy otherwise, patient comfortable without x-ray this time is no emergent indication  Supportive care discussed  Results and differential diagnosis were discussed with the patient/parent/guardian. Close follow up outpatient was discussed, comfortable with the plan.   Medications - No data to display  Filed Vitals:   03/13/14 0913  BP: 151/91  Pulse: 83  Temp: 97.9 F (36.6 C)  TempSrc: Oral  Resp: 16  SpO2: 95%    Final diagnoses:  Acute bronchitis, unspecified organism        Mariea Clonts, MD 03/16/14 [phone]

## 2014-06-01 ENCOUNTER — Emergency Department (INDEPENDENT_AMBULATORY_CARE_PROVIDER_SITE_OTHER): Payer: Self-pay

## 2014-06-01 ENCOUNTER — Encounter (HOSPITAL_COMMUNITY): Payer: Self-pay

## 2014-06-01 ENCOUNTER — Emergency Department (INDEPENDENT_AMBULATORY_CARE_PROVIDER_SITE_OTHER)
Admission: EM | Admit: 2014-06-01 | Discharge: 2014-06-01 | Disposition: A | Discharge status: Home / Self Care | Payer: Self-pay | Source: Home / Self Care | Attending: Family Medicine | Admitting: Family Medicine

## 2014-06-01 DIAGNOSIS — J209 Acute bronchitis, unspecified: Secondary | ICD-10-CM

## 2014-06-01 DIAGNOSIS — R0789 Other chest pain: Secondary | ICD-10-CM

## 2014-06-01 MED ORDER — MINOCYCLINE HCL 100 MG PO CAPS: 100.0000 mg | ORAL_CAPSULE | Freq: Two times a day (BID) | ORAL | Status: DC

## 2014-06-01 MED ORDER — DICLOFENAC POTASSIUM 50 MG PO TABS: 50.0000 mg | ORAL_TABLET | Freq: Three times a day (TID) | ORAL | Status: DC

## 2014-06-01 MED ORDER — GUAIFENESIN-CODEINE 100-10 MG/5ML PO SYRP: 10.0000 mL | ORAL_SOLUTION | Freq: Four times a day (QID) | ORAL | Status: DC | PRN

## 2014-06-01 NOTE — ED Provider Notes (Signed)
CSN: [id number]     Arrival date & time 06/01/14  1341 History   First MD Initiated Contact with Patient 06/01/14 1426     Chief Complaint  Patient presents with  . Chest Pain   (Consider location/radiation/quality/duration/timing/severity/associated sxs/prior Treatment) Patient is a 34 y.o. female presenting with chest pain. The history is provided by the patient.  Chest Pain Pain location:  L lateral chest and R lateral chest Pain quality: sharp   Pain radiates to the back: yes   Pain severity:  Moderate Onset quality:  Gradual Duration:  4 days Progression:  Unchanged Chronicity:  New Context comment:  Cough, smoker, no fever. Associated symptoms: back pain and cough   Associated symptoms: no fever, no palpitations and no shortness of breath   Risk factors: smoking     History reviewed. No pertinent past medical history. History reviewed. No pertinent past surgical history. Family History  Problem Relation Age of Onset  . Asthma Mother   . Cancer Other    History  Substance Use Topics  . Smoking status: Current Every Day Smoker    Types: Cigarettes  . Smokeless tobacco: Not on file  . Alcohol Use: Yes   OB History    No data available     Review of Systems  Constitutional: Negative.  Negative for fever.  Respiratory: Positive for cough. Negative for shortness of breath and wheezing.   Cardiovascular: Positive for chest pain. Negative for palpitations.  Musculoskeletal: Positive for back pain.    Allergies  Review of patient's allergies indicates no known allergies.  Home Medications   Prior to Admission medications   Medication Sig Start Date End Date Taking? Authorizing Provider  amoxicillin-clavulanate (AUGMENTIN) 875-125 MG per tablet Take 1 tablet by mouth 2 (two) times daily. Patient not taking: Reported on 02/20/2014 10/13/13   Domenic Moras, PA-C  diclofenac (CATAFLAM) 50 MG tablet Take 1 tablet (50 mg total) by mouth 3 (three) times daily. For chest  pains 06/01/14   Billy Fischer, MD  guaiFENesin-codeine [facility]) 100-10 MG/5ML syrup Take 10 mLs by mouth 4 (four) times daily as needed for cough. 06/01/14   Billy Fischer, MD  HYDROcodone-acetaminophen (NORCO/VICODIN) 5-325 MG per tablet Take 1 tablet by mouth every 6 (six) hours as needed for moderate pain or severe pain. Patient not taking: Reported on 02/20/2014 10/13/13   Domenic Moras, PA-C  ibuprofen (ADVIL,MOTRIN) 200 MG tablet Take 400 mg by mouth every 6 (six) hours as needed for headache.     Historical Provider, MD  minocycline (MINOCIN,DYNACIN) 100 MG capsule Take 1 capsule (100 mg total) by mouth 2 (two) times daily. 06/01/14   Billy Fischer, MD  naproxen (NAPROSYN) 500 MG tablet Take 1 tablet (500 mg total) by mouth 2 (two) times daily. Patient not taking: Reported on 02/20/2014 05/22/13   Antonietta Breach, PA-C  Pseudoephedrine-DM-GG-APAP 30-15-200-325 MG TABS Take 2 tablets by mouth every 6 (six) hours as needed (for cold).    Historical Provider, MD  traMADol (ULTRAM) 50 MG tablet Take 1 tablet (50 mg total) by mouth every 6 (six) hours as needed. Patient not taking: Reported on 03/13/2014 02/20/14   Heather Laisure, PA-C   BP 132/85 mmHg  Pulse 76  Temp(Src) 98.9 F (37.2 C) (Oral)  Resp 18  SpO2 96%  LMP 05/03/2014 Physical Exam  Constitutional: She is oriented to person, place, and time. She appears well-developed and well-nourished.  HENT:  Right Ear: External ear normal.  Left Ear: External ear  normal.  Mouth/Throat: Oropharynx is clear and moist.  Eyes: Pupils are equal, round, and reactive to light.  Neck: Normal range of motion. Neck supple.  Cardiovascular: Normal rate, normal heart sounds and intact distal pulses.   Pulmonary/Chest: Effort normal and breath sounds normal. She exhibits tenderness.  bilat upper chest soreness to palp l>r, reproducing sx, also across upper back.  Lymphadenopathy:    She has no cervical adenopathy.  Neurological: She is alert and  oriented to person, place, and time.  Skin: Skin is warm and dry.  Nursing note and vitals reviewed.   ED Course  Procedures (including critical care time) Labs Review Labs Reviewed - No data to display  Imaging Review Dg Chest 2 View  06/01/2014   CLINICAL DATA:  Chest pain dose to the back for 5 days. Current smoker  EXAM: CHEST  2 VIEW  COMPARISON:  None.  FINDINGS: Normal mediastinum and cardiac silhouette. Chronic central bronchitic markings. Normal pulmonary vasculature. No effusion, infiltrate, or pneumothorax.  IMPRESSION: Chronic bronchitic markings without acute findings.  Acute the   Electronically Signed   By: Suzy Bouchard M.D.   On: 06/01/2014 14:51   X-rays reviewed and report per radiologist.   MDM   1. Acute chest wall pain   2. Acute bronchitis, unspecified organism        Billy Fischer, MD 06/01/14 1623

## 2014-06-01 NOTE — Discharge Instructions (Signed)
Take all of medicine, drink lots of fluids, no more smoking, see your doctor if further problems  °

## 2014-06-01 NOTE — ED Notes (Signed)
Concern for cough & chest soreness since 4-3, worse w cough, direct pressure left chest

## 2014-07-16 ENCOUNTER — Encounter (HOSPITAL_COMMUNITY): Payer: Self-pay | Admitting: *Deleted

## 2014-07-16 ENCOUNTER — Emergency Department (HOSPITAL_COMMUNITY)
Admission: EM | Admit: 2014-07-16 | Discharge: 2014-07-16 | Disposition: A | Discharge status: Home / Self Care | Payer: Self-pay | Attending: Emergency Medicine | Admitting: Emergency Medicine

## 2014-07-16 DIAGNOSIS — K088 Other specified disorders of teeth and supporting structures: Secondary | ICD-10-CM | POA: Insufficient documentation

## 2014-07-16 DIAGNOSIS — Z792 Long term (current) use of antibiotics: Secondary | ICD-10-CM | POA: Insufficient documentation

## 2014-07-16 DIAGNOSIS — Z791 Long term (current) use of non-steroidal anti-inflammatories (NSAID): Secondary | ICD-10-CM | POA: Insufficient documentation

## 2014-07-16 DIAGNOSIS — K0889 Other specified disorders of teeth and supporting structures: Secondary | ICD-10-CM

## 2014-07-16 DIAGNOSIS — Z720 Tobacco use: Secondary | ICD-10-CM | POA: Insufficient documentation

## 2014-07-16 MED ORDER — PENICILLIN V POTASSIUM 500 MG PO TABS: 500.0000 mg | ORAL_TABLET | Freq: Four times a day (QID) | ORAL | Status: AC

## 2014-07-16 MED ORDER — HYDROCODONE-ACETAMINOPHEN 5-325 MG PO TABS: 1.0000 | ORAL_TABLET | Freq: Four times a day (QID) | ORAL | Status: DC | PRN

## 2014-07-16 NOTE — ED Provider Notes (Signed)
CSN: [id number]     Arrival date & time 07/16/14  1653 History  This chart was scribed for non-physician practitioner Doyce Loose, PA-C, working with Serita Grit, MD, by Thea Alken, ED Scribe. This patient was seen in room WTR6/WTR6 and the patient's care was started at 5:25 PM.  Chief Complaint  Patient presents with  . Dental Pain   The history is provided by the patient. No language interpreter was used.   Catherine Cannon is a 34 y.o. female who presents to the Emergency Department complaining of worsening, constant, throbbing, left upper dental pain that began about 4 weeks ago. Pt states she has a broken tooth. She has taken aleve without relief to pain. Pt has not seen a dentist in several years due to not having dental insurance. Pt denies fever, chills, CP, SOB and drooling. She denies hx of DM.  History reviewed. No pertinent past medical history. History reviewed. No pertinent past surgical history. Family History  Problem Relation Age of Onset  . Asthma Mother   . Cancer Other    History  Substance Use Topics  . Smoking status: Current Every Day Smoker    Types: Cigarettes  . Smokeless tobacco: Not on file  . Alcohol Use: Yes   OB History    No data available     Review of Systems  Constitutional: Negative for fever and chills.  HENT: Positive for dental problem. Negative for drooling and facial swelling.   Respiratory: Negative for shortness of breath.   Cardiovascular: Negative for chest pain.  Gastrointestinal: Negative for nausea and vomiting.     Allergies  Review of patient's allergies indicates no known allergies.  Home Medications   Prior to Admission medications   Medication Sig Start Date End Date Taking? Authorizing Provider  amoxicillin-clavulanate (AUGMENTIN) 875-125 MG per tablet Take 1 tablet by mouth 2 (two) times daily. Patient not taking: Reported on 02/20/2014 10/13/13   Domenic Moras, PA-C  diclofenac (CATAFLAM) 50 MG tablet Take 1 tablet (50  mg total) by mouth 3 (three) times daily. For chest pains 06/01/14   Billy Fischer, MD  guaiFENesin-codeine [facility]) 100-10 MG/5ML syrup Take 10 mLs by mouth 4 (four) times daily as needed for cough. 06/01/14   Billy Fischer, MD  HYDROcodone-acetaminophen (NORCO/VICODIN) 5-325 MG per tablet Take 1-2 tablets by mouth every 6 (six) hours as needed. 07/16/14   Al Corpus, PA-C  ibuprofen (ADVIL,MOTRIN) 200 MG tablet Take 400 mg by mouth every 6 (six) hours as needed for headache.     Historical Provider, MD  minocycline (MINOCIN,DYNACIN) 100 MG capsule Take 1 capsule (100 mg total) by mouth 2 (two) times daily. 06/01/14   Billy Fischer, MD  naproxen (NAPROSYN) 500 MG tablet Take 1 tablet (500 mg total) by mouth 2 (two) times daily. Patient not taking: Reported on 02/20/2014 05/22/13   Antonietta Breach, PA-C  penicillin v potassium (VEETID) 500 MG tablet Take 1 tablet (500 mg total) by mouth 4 (four) times daily. 07/16/14 07/23/14  Al Corpus, PA-C  Pseudoephedrine-DM-GG-APAP 30-15-200-325 MG TABS Take 2 tablets by mouth every 6 (six) hours as needed (for cold).    Historical Provider, MD  traMADol (ULTRAM) 50 MG tablet Take 1 tablet (50 mg total) by mouth every 6 (six) hours as needed. Patient not taking: Reported on 03/13/2014 02/20/14   Hyman Bible, PA-C   BP 150/93 mmHg  Pulse 89  Temp(Src) 99 F (37.2 C) (Oral)  Resp 18  Wt 180 lb (81.647  kg)  SpO2 97%  LMP 06/25/2014 Physical Exam  Constitutional: She appears well-developed and well-nourished. No distress.  HENT:  Head: Normocephalic and atraumatic.  Mouth/Throat: Oropharynx is clear and moist. No oropharyngeal exudate.  No trismus or uvula deviation No lip, tongue, facial swelling. No swelling under tongue or tenderness. Patient handling secretions. No drooling. Patient with poor dentition. Patient with tenderness to left upper back molar with mild erythema in this gingiva but no drainable abscess.   Eyes: Conjunctivae are  normal. Right eye exhibits no discharge. Left eye exhibits no discharge.  Neck: Normal range of motion. Neck supple.  No neck masses or tenderness.  Cardiovascular: Normal rate and regular rhythm.   Pulmonary/Chest: Effort normal and breath sounds normal. No respiratory distress. She has no wheezes.  Abdominal: Soft. She exhibits no distension. There is no tenderness.  Lymphadenopathy:    She has no cervical adenopathy.  Neurological: She is alert. Coordination normal.  Skin: Skin is warm and dry. She is not diaphoretic.  Nursing note and vitals reviewed.   ED Course  Procedures (including critical care time) DIAGNOSTIC STUDIES: Oxygen Saturation is 97% on RA, normal by my interpretation.    COORDINATION OF CARE: 5:41 PM- Pt advised of plan for treatment and pt agrees.  Labs Review Labs Reviewed - No data to display  Imaging Review No results found.   EKG Interpretation None        MDM   Final diagnoses:  Pain, dental   Patient with toothache.  No gross abscess.  No history of diabetes. Exam unconcerning for Ludwig's angina or spread of infection.  Will treat with penicillin and pain medicine.  Driving and sedation precautions provided. Urged patient to follow-up with dentist.  ED resources provided. Dental referral provided.  Discussed return precautions with patient. Discussed all results and patient verbalizes understanding and agrees with plan.  I personally performed the services described in this documentation, which was scribed in my presence. The recorded information has been reviewed and is accurate.   Al Corpus, PA-C 07/17/14 1541  Serita Grit, MD 07/20/14 [phone]

## 2014-07-16 NOTE — Progress Notes (Signed)
EDCM spoke to patient at bedside. Patient confirms she does not have a pcp or insurance living in [place].  [facility] provided patient with pamphlet to [facility], informed patient of services there and walk in times.  EDCM also provided patient with list of pcps who accept self pay patients, list of discount pharmacies and websites needymeds.org and GoodRX.com for medication assistance, phone number to inquire about the orange card, phone number to inquire about [employer], phone number to inquire about the [place], financial resources in the community such as local churches, [employer], urban ministries, and dental assistance for uninsured patients. [facility] informed patient that rx for antibiotic is free at [facility].  Patient thankful for resources.  No further EDCM needs at this time.

## 2014-07-16 NOTE — ED Notes (Signed)
Patient was educated not to drive, operate heavy machinery, or drink alcohol while taking narcotic medication.  

## 2014-07-16 NOTE — ED Notes (Signed)
Pt complains of 9/10 pain in her upper left molar for the past 4 days. Pt states she broke her tooth 4 months ago. Pt states she has not been able to go to a dentist.

## 2014-07-16 NOTE — Discharge Instructions (Signed)
Return to the emergency room with worsening of symptoms, new symptoms or with symptoms that are concerning, especially fevers, facial swelling, drooling, unable to open mouth fully, nausea, vomiting Ibuprofen 400mg  (2 tablets 200mg ) every 5-6 hours for 3-5 days. Norco for severe pain. Do not operate machinery, drive or drink alcohol while taking narcotics or muscle relaxers. Please take all of your antibiotics until finished!   You may develop abdominal discomfort or diarrhea from the antibiotic.  You may help offset this with probiotics which you can buy or get in yogurt. Do not eat  or take the probiotics until 2 hours after your antibiotic.  Follow up with dentist. Use provided resources or call the number above to establish care. Read below information and follow recommendations.  Dental Pain A tooth ache may be caused by cavities (tooth decay). Cavities expose the nerve of the tooth to air and hot or cold temperatures. It may come from an infection or abscess (also called a boil or furuncle) around your tooth. It is also often caused by dental caries (tooth decay). This causes the pain you are having. DIAGNOSIS  Your caregiver can diagnose this problem by exam. TREATMENT   If caused by an infection, it may be treated with medications which kill germs (antibiotics) and pain medications as prescribed by your caregiver. Take medications as directed.  Only take over-the-counter or prescription medicines for pain, discomfort, or fever as directed by your caregiver.  Whether the tooth ache today is caused by infection or dental disease, you should see your dentist as soon as possible for further care. SEEK MEDICAL CARE IF: The exam and treatment you received today has been provided on an emergency basis only. This is not a substitute for complete medical or dental care. If your problem worsens or new problems (symptoms) appear, and you are unable to meet with your dentist, call or return to this  location. SEEK IMMEDIATE MEDICAL CARE IF:   You have a fever.  You develop redness and swelling of your face, jaw, or neck.  You are unable to open your mouth.  You have severe pain uncontrolled by pain medicine. MAKE SURE YOU:   Understand these instructions.  Will watch your condition.  Will get help right away if you are not doing well or get worse. Document Released: 02/09/2005 Document Revised: 05/04/2011 Document Reviewed: 09/28/2007 [facility] Patient Information 2015 [place], [place]. This information is not intended to replace advice given to you by your health care provider. Make sure you discuss any questions you have with your health care provider.

## 2014-10-21 ENCOUNTER — Encounter (HOSPITAL_COMMUNITY): Payer: Self-pay

## 2014-10-21 ENCOUNTER — Emergency Department (HOSPITAL_COMMUNITY)
Admission: EM | Admit: 2014-10-21 | Discharge: 2014-10-21 | Disposition: A | Discharge status: Home / Self Care | Payer: Self-pay | Attending: Emergency Medicine | Admitting: Emergency Medicine

## 2014-10-21 DIAGNOSIS — Z720 Tobacco use: Secondary | ICD-10-CM | POA: Insufficient documentation

## 2014-10-21 DIAGNOSIS — Z791 Long term (current) use of non-steroidal anti-inflammatories (NSAID): Secondary | ICD-10-CM | POA: Insufficient documentation

## 2014-10-21 DIAGNOSIS — K088 Other specified disorders of teeth and supporting structures: Secondary | ICD-10-CM | POA: Insufficient documentation

## 2014-10-21 DIAGNOSIS — Z79899 Other long term (current) drug therapy: Secondary | ICD-10-CM | POA: Insufficient documentation

## 2014-10-21 DIAGNOSIS — M542 Cervicalgia: Secondary | ICD-10-CM | POA: Insufficient documentation

## 2014-10-21 DIAGNOSIS — K0889 Other specified disorders of teeth and supporting structures: Secondary | ICD-10-CM

## 2014-10-21 MED ORDER — PENICILLIN V POTASSIUM 250 MG PO TABS
250.0000 mg | ORAL_TABLET | Freq: Four times a day (QID) | ORAL | Status: AC
Start: 2014-10-21 — End: 2014-10-28

## 2014-10-21 NOTE — Discharge Instructions (Signed)

## 2014-10-21 NOTE — ED Provider Notes (Signed)
CSN: [id number]     Arrival date & time 10/21/14  2135 History  This chart was scribed for non-physician provider Montine Circle, PA-C, working with Sharlett Iles, MD by Irene Pap, ED Scribe. This patient was seen in room TR08C/TR08C and patient care was started at 10:22 PM.   Chief Complaint  Patient presents with  . Dental Pain   The history is provided by the patient. No language interpreter was used.  HPI Comments: Catherine Cannon is a 34 y.o. female who presents to the Emergency Department complaining of gradually worsening, radiating left upper molar dental pain onset one week ago. Pt states that the tooth is cracked. She states that the pain radiates to her left head and neck. She reports taking ibuprofen and Tylenol to no relief. She denies having a dentist or allergies to any anti-biotics.   History reviewed. No pertinent past medical history. History reviewed. No pertinent past surgical history. Family History  Problem Relation Age of Onset  . Asthma Mother   . Cancer Other    Social History  Substance Use Topics  . Smoking status: Current Every Day Smoker -- 1.00 packs/day    Types: Cigarettes  . Smokeless tobacco: None  . Alcohol Use: Yes     Comment: occ    OB History    No data available     Review of Systems  HENT: Positive for dental problem and facial swelling.   Musculoskeletal: Positive for neck pain.  Neurological: Positive for headaches.   Allergies  Review of patient's allergies indicates no known allergies.  Home Medications   Prior to Admission medications   Medication Sig Start Date End Date Taking? Authorizing Provider  amoxicillin-clavulanate (AUGMENTIN) 875-125 MG per tablet Take 1 tablet by mouth 2 (two) times daily. Patient not taking: Reported on 02/20/2014 10/13/13   Domenic Moras, PA-C  diclofenac (CATAFLAM) 50 MG tablet Take 1 tablet (50 mg total) by mouth 3 (three) times daily. For chest pains 06/01/14   Billy Fischer, MD   guaiFENesin-codeine [facility]) 100-10 MG/5ML syrup Take 10 mLs by mouth 4 (four) times daily as needed for cough. 06/01/14   Billy Fischer, MD  HYDROcodone-acetaminophen (NORCO/VICODIN) 5-325 MG per tablet Take 1-2 tablets by mouth every 6 (six) hours as needed. 07/16/14   Al Corpus, PA-C  ibuprofen (ADVIL,MOTRIN) 200 MG tablet Take 400 mg by mouth every 6 (six) hours as needed for headache.     Historical Provider, MD  minocycline (MINOCIN,DYNACIN) 100 MG capsule Take 1 capsule (100 mg total) by mouth 2 (two) times daily. 06/01/14   Billy Fischer, MD  naproxen (NAPROSYN) 500 MG tablet Take 1 tablet (500 mg total) by mouth 2 (two) times daily. Patient not taking: Reported on 02/20/2014 05/22/13   Antonietta Breach, PA-C  Pseudoephedrine-DM-GG-APAP 30-15-200-325 MG TABS Take 2 tablets by mouth every 6 (six) hours as needed (for cold).    Historical Provider, MD  traMADol (ULTRAM) 50 MG tablet Take 1 tablet (50 mg total) by mouth every 6 (six) hours as needed. Patient not taking: Reported on 03/13/2014 02/20/14   Heather Laisure, PA-C   BP 150/90 mmHg  Pulse 87  Temp(Src) 98.2 F (36.8 C) (Oral)  Resp 16  SpO2 94%  LMP 10/14/2014  Physical Exam  Constitutional: She is oriented to person, place, and time. She appears well-developed and well-nourished. No distress.  HENT:  Head: Normocephalic and atraumatic.  Mouth/Throat: Oropharynx is clear and moist.    Poor dentition throughout.  Affected tooth as diagrammed.  No signs of peritonsillar or tonsillar abscess.  No signs of gingival abscess. Oropharynx is clear and without exudates.  Uvula is midline.  Airway is intact. No signs of Ludwig's angina with palpation of oral and sublingual mucosa.   Eyes: Conjunctivae and EOM are normal.  Neck: Normal range of motion. Neck supple.  Cardiovascular: Normal rate, regular rhythm and normal heart sounds.   Pulmonary/Chest: Effort normal and breath sounds normal. No respiratory distress.  Abdominal:  She exhibits no distension.  Musculoskeletal: Normal range of motion. She exhibits no edema.  Neurological: She is alert and oriented to person, place, and time. No sensory deficit.  Skin: Skin is warm and dry.  Psychiatric: She has a normal mood and affect. Her behavior is normal. Judgment and thought content normal.  Nursing note and vitals reviewed.   ED Course  Procedures (including critical care time) DIAGNOSTIC STUDIES: Oxygen Saturation is 94% on RA, adequate by my interpretation.    COORDINATION OF CARE: 10:23 PM-Discussed treatment plan which includes penicillin and referral to dentist with pt at bedside and pt agreed to plan.   Labs Review Labs Reviewed - No data to display  Imaging Review No results found.    EKG Interpretation None      MDM   Final diagnoses:  Pain, dental    Patient with toothache.  No gross abscess.  Exam unconcerning for Ludwig's angina or spread of infection.  Will treat with penicillin and OTC pain medicine.  Urged patient to follow-up with dentist.    I personally performed the services described in this documentation, which was scribed in my presence. The recorded information has been reviewed and is accurate.      Montine Circle, PA-C 10/21/14 [place], MD 10/21/14 [phone]

## 2014-10-21 NOTE — ED Notes (Signed)
Onset 1 week  Left upper molar pain.  Tooth is cracked.

## 2015-08-11 ENCOUNTER — Other Ambulatory Visit: Payer: Self-pay

## 2015-08-11 ENCOUNTER — Encounter (HOSPITAL_COMMUNITY): Payer: Self-pay | Admitting: Emergency Medicine

## 2015-08-11 ENCOUNTER — Emergency Department (HOSPITAL_COMMUNITY)
Admission: EM | Admit: 2015-08-11 | Discharge: 2015-08-11 | Disposition: A | Discharge status: Home / Self Care | Payer: Self-pay | Attending: Emergency Medicine | Admitting: Emergency Medicine

## 2015-08-11 ENCOUNTER — Emergency Department (HOSPITAL_COMMUNITY): Payer: Self-pay

## 2015-08-11 DIAGNOSIS — R0602 Shortness of breath: Secondary | ICD-10-CM | POA: Insufficient documentation

## 2015-08-11 DIAGNOSIS — R079 Chest pain, unspecified: Secondary | ICD-10-CM

## 2015-08-11 DIAGNOSIS — N938 Other specified abnormal uterine and vaginal bleeding: Secondary | ICD-10-CM | POA: Insufficient documentation

## 2015-08-11 DIAGNOSIS — Z79899 Other long term (current) drug therapy: Secondary | ICD-10-CM | POA: Insufficient documentation

## 2015-08-11 DIAGNOSIS — N920 Excessive and frequent menstruation with regular cycle: Secondary | ICD-10-CM

## 2015-08-11 DIAGNOSIS — F17210 Nicotine dependence, cigarettes, uncomplicated: Secondary | ICD-10-CM | POA: Insufficient documentation

## 2015-08-11 DIAGNOSIS — R072 Precordial pain: Secondary | ICD-10-CM | POA: Insufficient documentation

## 2015-08-11 LAB — BASIC METABOLIC PANEL
ANION GAP: 9 (ref 5–15)
BUN: 6 mg/dL (ref 6–20)
CHLORIDE: 102 mmol/L (ref 101–111)
CO2: 23 mmol/L (ref 22–32)
Calcium: 9.7 mg/dL (ref 8.9–10.3)
Creatinine, Ser: 0.68 mg/dL (ref 0.44–1.00)
GFR calc Af Amer: >60 mL/min (ref >60)
Glucose, Bld: 102 mg/dL — ABNORMAL HIGH (ref 65–99)
POTASSIUM: 3.6 mmol/L (ref 3.5–5.1)
SODIUM: 134 mmol/L — AB (ref 135–145)

## 2015-08-11 LAB — I-STAT TROPONIN, ED: TROPONIN I, POC: 0 ng/mL (ref 0.00–0.08)

## 2015-08-11 LAB — CBC
HCT: 47.9 % — ABNORMAL HIGH (ref 36.0–46.0)
Hemoglobin: 16 g/dL — ABNORMAL HIGH (ref 12.0–15.0)
MCH: 32.7 pg (ref 26.0–34.0)
MCHC: 33.4 g/dL (ref 30.0–36.0)
MCV: 98 fL (ref 78.0–100.0)
PLATELETS: 265 10*3/uL (ref 150–400)
RBC: 4.89 MIL/uL (ref 3.87–5.11)
RDW: 14.3 % (ref 11.5–15.5)
WBC: 11.8 10*3/uL — AB (ref 4.0–10.5)

## 2015-08-11 LAB — BRAIN NATRIURETIC PEPTIDE: B Natriuretic Peptide: 24.5 pg/mL (ref 0.0–100.0)

## 2015-08-11 LAB — WET PREP, GENITAL
SPERM: NONE SEEN
Trich, Wet Prep: NONE SEEN
Yeast Wet Prep HPF POC: NONE SEEN

## 2015-08-11 LAB — I-STAT BETA HCG BLOOD, ED (MC, WL, AP ONLY): I-stat hCG, quantitative: <5 m[IU]/mL (ref <5)

## 2015-08-11 MED ORDER — KETOROLAC TROMETHAMINE 60 MG/2ML IM SOLN
60.0000 mg | Freq: Once | INTRAMUSCULAR | Status: AC
  Administered 2015-08-11: 60 mg via INTRAMUSCULAR
  Filled 2015-08-11: qty 2

## 2015-08-11 MED ORDER — MEDROXYPROGESTERONE ACETATE 5 MG PO TABS: 5.0000 mg | ORAL_TABLET | Freq: Every day | ORAL | Status: DC

## 2015-08-11 MED ORDER — ALBUTEROL SULFATE HFA 108 (90 BASE) MCG/ACT IN AERS
1.0000 | INHALATION_SPRAY | RESPIRATORY_TRACT | Status: DC | PRN
  Administered 2015-08-11: 2 via RESPIRATORY_TRACT
  Filled 2015-08-11: qty 6.7

## 2015-08-11 NOTE — ED Notes (Signed)
Pt sts mid sternal CP worse with movement and inspiration x 1 month with some SOB; pt sts heavy vaginal bleeding x 3 weeks

## 2015-08-11 NOTE — ED Provider Notes (Signed)
CSN: [id number]     Arrival date & time 08/11/15  1613 History   First MD Initiated Contact with Patient 08/11/15 1736     Chief Complaint  Patient presents with  . Chest Pain  . Vaginal Bleeding     (Consider location/radiation/quality/duration/timing/severity/associated sxs/prior Treatment) Patient is a 35 y.o. female presenting with chest pain and vaginal bleeding. The history is provided by the patient.  Chest Pain Pain location:  Substernal area Pain quality: aching, sharp and shooting   Pain radiates to:  Does not radiate Pain radiates to the back: no   Pain severity:  Moderate Onset quality:  Gradual Duration:  3 weeks Timing:  Constant Progression:  Worsening Chronicity:  New Context comment:  Started spontaneously Relieved by: Initially was improved by NSAIDs but now they are not working. Worsened by:  Coughing, deep breathing and movement Associated symptoms: cough, lower extremity edema and shortness of breath   Associated symptoms: no abdominal pain, no back pain, no dizziness, no fever, no nausea, no syncope, not vomiting and no weakness   Associated symptoms comment:  DOE only.  No wheezing.  Occasional dry cough but no URI sx Risk factors: obesity and smoking   Risk factors: no birth control, no coronary artery disease, no diabetes mellitus, no hypertension, no immobilization, not female, not pregnant and no prior DVT/PE   Vaginal Bleeding Quality:  Clots and dark red Severity:  Moderate Onset quality:  Sudden Duration:  3 weeks Timing:  Constant Progression:  Waxing and waning Chronicity:  New Menstrual history:  Regular Number of pads used:  1 pad per hour Number of tampons used:  1 tampon per hour if not using pads Possible pregnancy: no   Context: spontaneously   Relieved by:  None tried Worsened by:  Nothing tried Ineffective treatments:  None tried Associated symptoms: no abdominal pain, no back pain, no dizziness, no fever and no nausea   Risk  factors: no bleeding disorder     History reviewed. No pertinent past medical history. History reviewed. No pertinent past surgical history. Family History  Problem Relation Age of Onset  . Asthma Mother   . Cancer Other    Social History  Substance Use Topics  . Smoking status: Current Every Day Smoker -- 1.00 packs/day    Types: Cigarettes  . Smokeless tobacco: None  . Alcohol Use: Yes     Comment: occ    OB History    No data available     Review of Systems  Constitutional: Negative for fever.  Respiratory: Positive for cough and shortness of breath.   Cardiovascular: Positive for chest pain. Negative for syncope.  Gastrointestinal: Negative for nausea, vomiting and abdominal pain.  Genitourinary: Positive for vaginal bleeding.  Musculoskeletal: Negative for back pain.  Neurological: Negative for dizziness and weakness.  All other systems reviewed and are negative.     Allergies  Review of patient's allergies indicates no known allergies.  Home Medications   Prior to Admission medications   Medication Sig Start Date End Date Taking? Authorizing Provider  amoxicillin-clavulanate (AUGMENTIN) 875-125 MG per tablet Take 1 tablet by mouth 2 (two) times daily. Patient not taking: Reported on 02/20/2014 10/13/13   Domenic Moras, PA-C  diclofenac (CATAFLAM) 50 MG tablet Take 1 tablet (50 mg total) by mouth 3 (three) times daily. For chest pains 06/01/14   Billy Fischer, MD  guaiFENesin-codeine [facility]) 100-10 MG/5ML syrup Take 10 mLs by mouth 4 (four) times daily as needed for cough.  06/01/14   Billy Fischer, MD  HYDROcodone-acetaminophen (NORCO/VICODIN) 5-325 MG per tablet Take 1-2 tablets by mouth every 6 (six) hours as needed. 07/16/14   Al Corpus, PA-C  ibuprofen (ADVIL,MOTRIN) 200 MG tablet Take 400 mg by mouth every 6 (six) hours as needed for headache.     Historical Provider, MD  minocycline (MINOCIN,DYNACIN) 100 MG capsule Take 1 capsule (100 mg total) by mouth  2 (two) times daily. 06/01/14   Billy Fischer, MD  naproxen (NAPROSYN) 500 MG tablet Take 1 tablet (500 mg total) by mouth 2 (two) times daily. Patient not taking: Reported on 02/20/2014 05/22/13   Antonietta Breach, PA-C  Pseudoephedrine-DM-GG-APAP 30-15-200-325 MG TABS Take 2 tablets by mouth every 6 (six) hours as needed (for cold).    Historical Provider, MD  traMADol (ULTRAM) 50 MG tablet Take 1 tablet (50 mg total) by mouth every 6 (six) hours as needed. Patient not taking: Reported on 03/13/2014 02/20/14   Heather Laisure, PA-C   BP 135/87 mmHg  Pulse 81  Temp(Src) 98.8 F (37.1 C) (Oral)  Resp 18  Ht 5\' 9"  (1.753 m)  Wt 252 lb 8 oz (114.533 kg)  BMI 37.27 kg/m2  SpO2 97%  LMP 07/21/2015 Physical Exam  Constitutional: She is oriented to person, place, and time. She appears well-developed and well-nourished. No distress.  HENT:  Head: Normocephalic and atraumatic.  Right Ear: Tympanic membrane normal.  Left Ear: Tympanic membrane normal.  Nose: Nose normal.  Mouth/Throat: Oropharynx is clear and moist.  Eyes: Conjunctivae and EOM are normal. Pupils are equal, round, and reactive to light.  Neck: Normal range of motion. Neck supple.  Cardiovascular: Normal rate, regular rhythm and intact distal pulses.   No murmur heard. Pulmonary/Chest: Effort normal and breath sounds normal. No respiratory distress. She has no wheezes. She has no rales. She exhibits tenderness.  Abdominal: Soft. She exhibits no distension. There is no tenderness. There is no rebound and no guarding.  Musculoskeletal: Normal range of motion. She exhibits edema. She exhibits no tenderness.  Trace edema at ankles  Neurological: She is alert and oriented to person, place, and time.  Skin: Skin is warm and dry. No rash noted. No erythema.  Psychiatric: She has a normal mood and affect. Her behavior is normal.  Nursing note and vitals reviewed.   ED Course  Procedures (including critical care time) Labs Review Labs  Reviewed  WET PREP, GENITAL - Abnormal; Notable for the following:    Clue Cells Wet Prep HPF POC PRESENT (*)    WBC, Wet Prep HPF POC MODERATE (*)    All other components within normal limits  BASIC METABOLIC PANEL - Abnormal; Notable for the following:    Sodium 134 (*)    Glucose, Bld 102 (*)    All other components within normal limits  CBC - Abnormal; Notable for the following:    WBC 11.8 (*)    Hemoglobin 16.0 (*)    HCT 47.9 (*)    All other components within normal limits  BRAIN NATRIURETIC PEPTIDE  I-STAT TROPOININ, ED  I-STAT BETA HCG BLOOD, ED ([facility], [facility], [facility] ONLY)  GC/CHLAMYDIA PROBE AMP ([place]) NOT AT [facility]    Imaging Review Dg Chest 2 View  08/11/2015  CLINICAL DATA:  Bilateral chest pain for 1 month, shortness of breath for 1 week. History of chronic bronchitis, smoker. EXAM: CHEST  2 VIEW COMPARISON:  Chest radiograph June 01, 2014 FINDINGS: Cardiac silhouette is upper limits of normal in  size. Mediastinal silhouette is nonsuspicious. Moderate similar bronchitic changes without pleural effusion or focal consolidation. No pneumothorax. Soft tissue planes and included osseous structures are normal. IMPRESSION: Moderate bronchitic changes without focal consolidation. Borderline cardiomegaly. Electronically Signed   By: Elon Alas M.D.   On: 08/11/2015 16:39   I have personally reviewed and evaluated these images and lab results as part of my medical decision-making.   EKG Interpretation   Date/Time:  Sunday August 11 2015 16:19:04 EDT Ventricular Rate:  85 PR Interval:  150 QRS Duration: 88 QT Interval:  372 QTC Calculation: 442 R Axis:   36 Text Interpretation:  Normal sinus rhythm Nonspecific ST abnormality No  previous tracing Confirmed by Maryan Rued  MD, Loree Fee ([id number]) on 08/11/2015  5:39:09 PM        EMERGENCY DEPARTMENT [place] CARDIAC EXAM "Study: Limited Ultrasound of the heart and pericardium"  INDICATIONS:Dyspnea Multiple views of the heart and  pericardium are obtained with a multi-frequency probe.  PERFORMED [id number]  IMAGES ARCHIVED?: Yes  FINDINGS: No pericardial effusion and Normal contractility  LIMITATIONS:  none  VIEWS USED: Subcostal 4 chamber, Parasternal long axis and Parasternal short axis  INTERPRETATION: Cardiac activity present, Pericardial effusioin absent and Normal contractility  COMMENT:  normal   MDM   Final diagnoses:  Chest pain, unspecified chest pain type  DUB (dysfunctional uterine bleeding)  Menorrhagia with regular cycle    Patient presents today with a 39mo history of worsening centralized chest pain which is sharp in nature and worse with deep breathing and movement which causes some shortness of breath on exertion with minimal cough and no URI or allergy symptoms. She has never had pain with this before but has no risk factors for PE and is PERC neg.  she does have minimal swelling in the lower extremities at the ankle but otherwise a normal exam with reproducible chest pain with palpation. The pain is not associated with eating.  She does not have a history of asthma but is a smoker. Chest x-ray shows moderate bronchitic changes without focal consolidation, EKG with nonspecific ST abnormality with mild diffuse depression and almost all leads. No T-wave inversion or Q waves present.  No EKG findings for pericarditis. Patient has no family history of cardiac disease.  CBC, BMP, troponin and urine pregnancy are all negative.  BNP wnl.  Bedside cardiac u/s wihtout pericardial effusion.  Pt given inhaler and cardiac f/u to continue ibuprofen.  Secondly patient has had vaginal bleeding for the last 3 weeks which has been heavy in nature. Patient's hemoglobin is normal this time. She has never been known to have periods like this in the past. UPT is negative. Pelvic exam with moderate bleeding but no other abnormalities.  Pt given provera and f/u with OB/gyn  Blanchie Dessert, MD 08/11/15 2019

## 2015-08-11 NOTE — Discharge Instructions (Signed)
Abnormal Uterine Bleeding Abnormal uterine bleeding means bleeding from the vagina that is not your normal menstrual period. This can be:  Bleeding or spotting between periods.  Bleeding after sex (sexual intercourse).  Bleeding that is heavier or more than normal.  Periods that last longer than usual.  Bleeding after menopause. There are many problems that may cause this. Treatment will depend on the cause of the bleeding. Any kind of bleeding that is not normal should be reviewed by your doctor.  HOME CARE Watch your condition for any changes. These actions may lessen any discomfort you are having:  Do not use tampons or douches as told by your doctor.  Change your pads often. You should get regular pelvic exams and Pap tests. Keep all appointments for tests as told by your doctor. GET HELP IF:  You are bleeding for more than 1 week.  You feel dizzy at times. GET HELP RIGHT AWAY IF:   You pass out.  You have to change pads every 15 to 30 minutes.  You have belly pain.  You have a fever.  You become sweaty or weak.  You are passing large blood clots from the vagina.  You feel sick to your stomach (nauseous) and throw up (vomit). MAKE SURE YOU:  Understand these instructions.  Will watch your condition.  Will get help right away if you are not doing well or get worse.   This information is not intended to replace advice given to you by your health care provider. Make sure you discuss any questions you have with your health care provider.   Document Released: 12/07/2008 Document Revised: 02/14/2013 Document Reviewed: 09/08/2012 Elsevier Interactive Patient Education [employer].

## 2015-08-11 NOTE — ED Notes (Signed)
Lab to add-on bnp 

## 2015-08-12 LAB — GC/CHLAMYDIA PROBE AMP (~~LOC~~) NOT AT ARMC
CHLAMYDIA, DNA PROBE: NEGATIVE
Neisseria Gonorrhea: NEGATIVE

## 2015-09-01 ENCOUNTER — Encounter (HOSPITAL_COMMUNITY): Payer: Self-pay | Admitting: *Deleted

## 2015-09-01 ENCOUNTER — Emergency Department (HOSPITAL_COMMUNITY)
Admission: EM | Admit: 2015-09-01 | Discharge: 2015-09-01 | Disposition: A | Discharge status: Home / Self Care | Payer: Self-pay | Attending: Emergency Medicine | Admitting: Emergency Medicine

## 2015-09-01 DIAGNOSIS — F1210 Cannabis abuse, uncomplicated: Secondary | ICD-10-CM | POA: Insufficient documentation

## 2015-09-01 DIAGNOSIS — F17210 Nicotine dependence, cigarettes, uncomplicated: Secondary | ICD-10-CM | POA: Insufficient documentation

## 2015-09-01 DIAGNOSIS — D751 Secondary polycythemia: Secondary | ICD-10-CM | POA: Insufficient documentation

## 2015-09-01 DIAGNOSIS — Z79899 Other long term (current) drug therapy: Secondary | ICD-10-CM | POA: Insufficient documentation

## 2015-09-01 DIAGNOSIS — F1910 Other psychoactive substance abuse, uncomplicated: Secondary | ICD-10-CM

## 2015-09-01 DIAGNOSIS — R740 Nonspecific elevation of levels of transaminase and lactic acid dehydrogenase [LDH]: Secondary | ICD-10-CM

## 2015-09-01 DIAGNOSIS — F10920 Alcohol use, unspecified with intoxication, uncomplicated: Secondary | ICD-10-CM | POA: Insufficient documentation

## 2015-09-01 DIAGNOSIS — R7401 Elevation of levels of liver transaminase levels: Secondary | ICD-10-CM

## 2015-09-01 LAB — CBC
HCT: 48.7 % — ABNORMAL HIGH (ref 36.0–46.0)
HEMOGLOBIN: 16.6 g/dL — AB (ref 12.0–15.0)
MCH: 33.7 pg (ref 26.0–34.0)
MCHC: 34.1 g/dL (ref 30.0–36.0)
MCV: 98.8 fL (ref 78.0–100.0)
PLATELETS: 295 10*3/uL (ref 150–400)
RBC: 4.93 MIL/uL (ref 3.87–5.11)
RDW: 14.1 % (ref 11.5–15.5)
WBC: 13 10*3/uL — ABNORMAL HIGH (ref 4.0–10.5)

## 2015-09-01 LAB — COMPREHENSIVE METABOLIC PANEL
ALT: 74 U/L — ABNORMAL HIGH (ref 14–54)
ANION GAP: 10 (ref 5–15)
AST: 53 U/L — ABNORMAL HIGH (ref 15–41)
Albumin: 3.9 g/dL (ref 3.5–5.0)
Alkaline Phosphatase: 93 U/L (ref 38–126)
BUN: <5 mg/dL — ABNORMAL LOW (ref 6–20)
CHLORIDE: 104 mmol/L (ref 101–111)
CO2: 23 mmol/L (ref 22–32)
CREATININE: 0.67 mg/dL (ref 0.44–1.00)
Calcium: 9.4 mg/dL (ref 8.9–10.3)
Glucose, Bld: 137 mg/dL — ABNORMAL HIGH (ref 65–99)
POTASSIUM: 3.5 mmol/L (ref 3.5–5.1)
SODIUM: 137 mmol/L (ref 135–145)
Total Bilirubin: 0.5 mg/dL (ref 0.3–1.2)
Total Protein: 7 g/dL (ref 6.5–8.1)

## 2015-09-01 LAB — ETHANOL: ALCOHOL ETHYL (B): 246 mg/dL — AB (ref <5)

## 2015-09-01 LAB — RAPID URINE DRUG SCREEN, HOSP PERFORMED
AMPHETAMINES: NOT DETECTED
BARBITURATES: NOT DETECTED
BENZODIAZEPINES: POSITIVE — AB
COCAINE: NOT DETECTED
OPIATES: NOT DETECTED
TETRAHYDROCANNABINOL: NOT DETECTED

## 2015-09-01 LAB — SALICYLATE LEVEL

## 2015-09-01 LAB — ACETAMINOPHEN LEVEL

## 2015-09-01 NOTE — ED Notes (Addendum)
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  Pt last had ETOH (liquor) about 1 hour ago. Unable to quantify amount of ETOH each day. Pt also wants help with her xanax usage, last took just PTA. Reports she "self medicates". Pt denies SI or HI in triage.

## 2015-09-01 NOTE — ED Provider Notes (Signed)
CSN: [id number]     Arrival date & time 09/01/15  0151 History  By signing my name below, I, Irene Pap, attest that this documentation has been prepared under the direction and in the presence of Delora Fuel, MD. Electronically Signed: Irene Pap, ED Scribe. 09/01/2015. 3:17 AM.  Chief Complaint  Patient presents with  . Alcohol Problem   The history is provided by the patient. No language interpreter was used.  HPI Comments: Catherine Cannon is a 35 y.o. female who presents to the Emergency Department requesting alcohol detox. Pt states that she has other medical problems, but needs to get her alcohol usage under control first. Pt's last drink was right before she got here. She admits that she drinks Mike's Hard Lemonade, Vodka and Bootleggers daily. Her drinking will start at various times of the day, earliest being 10 AM. Pt states that she also "self-medicates" with Xanax. She is not prescribed this medication. Pt has been drinking heavily over the past 10 years. She states that she has been depressed, where she sleeps a lot, which is why she self medicates. Pt states that she occasionally smokes marijuana. She reports bilateral leg swelling. Pt states that her family has pushed her to come get help. 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 She states that she would probably be worse off if it weren't for her family. She denies SI, HI, hallucinations, fever, chills, diaphoresis, nausea, or vomiting.  Nurse reports that pt was in the waiting room when she went outside, drank more alcohol and "finished off" her bottle of Xanax, which pt states were only 2 pills.  History reviewed. No pertinent past medical history. History reviewed. No pertinent past surgical history. Family History  Problem Relation Age of Onset  . Asthma Mother   . Cancer Other    Social History  Substance Use Topics  . Smoking  status: Current Every Day Smoker -- 1.00 packs/day    Types: Cigarettes  . Smokeless tobacco: None  . Alcohol Use: Yes     Comment: occ    OB History    No data available     Review of Systems  Constitutional: Negative for fever, chills and diaphoresis.  Cardiovascular: Positive for leg swelling.  Gastrointestinal: Negative for nausea and vomiting.  Psychiatric/Behavioral: Negative for suicidal ideas and hallucinations.  All other systems reviewed and are negative.  Allergies  Review of patient's allergies indicates no known allergies.  Home Medications   Prior to Admission medications   Medication Sig Start Date End Date Taking? Authorizing Provider  cetirizine (ZYRTEC) 10 MG tablet Take 10 mg by mouth daily.    Historical Provider, MD  ibuprofen (ADVIL,MOTRIN) 200 MG tablet Take 400 mg by mouth every 6 (six) hours as needed for headache.     Historical Provider, MD  medroxyPROGESTERone (PROVERA) 5 MG tablet Take 1 tablet (5 mg total) by mouth daily. 08/11/15   Blanchie Dessert, MD   BP 123/66 mmHg  Pulse 96  Temp(Src) 98.1 F (36.7 C) (Oral)  Resp 18  SpO2 98%  LMP 08/21/2015 Physical Exam  Constitutional: She is oriented to person, place, and time. She appears well-developed and well-nourished.  HENT:  Head: Normocephalic and atraumatic.  Eyes: EOM are normal. Pupils are equal, round, and reactive to light.  Neck: Normal range of motion. Neck supple. No JVD present.  Cardiovascular: Normal rate, regular rhythm and normal heart  sounds.  Exam reveals no gallop and no friction rub.   No murmur heard. Pulmonary/Chest: Effort normal and breath sounds normal. She has no wheezes. She has no rales. She exhibits no tenderness.  Abdominal: Soft. Bowel sounds are normal. She exhibits no distension and no mass. There is no tenderness.  Musculoskeletal: Normal range of motion. She exhibits no edema.  1+ pitting edema bilaterally  Lymphadenopathy:    She has no cervical  adenopathy.  Neurological: She is alert and oriented to person, place, and time. No cranial nerve deficit. She exhibits normal muscle tone. Coordination normal.  Skin: Skin is warm and dry. No rash noted.  Psychiatric: She has a normal mood and affect. Her behavior is normal.  Nursing note and vitals reviewed.   ED Course  Procedures (including critical care time) DIAGNOSTIC STUDIES: Oxygen Saturation is 98% on RA, normal by my interpretation.    COORDINATION OF CARE: 3:15 AM-Discussed treatment plan which includes labs and outpatient resources with pt at bedside and pt agreed to plan.    Labs Review Results for orders placed or performed during the hospital encounter of 09/01/15  Comprehensive metabolic panel  Result Value Ref Range   Sodium 137 135 - 145 mmol/L   Potassium 3.5 3.5 - 5.1 mmol/L   Chloride 104 101 - 111 mmol/L   CO2 23 22 - 32 mmol/L   Glucose, Bld 137 (H) 65 - 99 mg/dL   BUN <5 (L) 6 - 20 mg/dL   Creatinine, Ser 0.67 0.44 - 1.00 mg/dL   Calcium 9.4 8.9 - 10.3 mg/dL   Total Protein 7.0 6.5 - 8.1 g/dL   Albumin 3.9 3.5 - 5.0 g/dL   AST 53 (H) 15 - 41 U/L   ALT 74 (H) 14 - 54 U/L   Alkaline Phosphatase 93 38 - 126 U/L   Total Bilirubin 0.5 0.3 - 1.2 mg/dL   GFR calc non Af Amer >60 >60 mL/min   GFR calc Af Amer >60 >60 mL/min   Anion gap 10 5 - 15  Ethanol  Result Value Ref Range   Alcohol, Ethyl (B) 246 (H) <5 mg/dL  Salicylate level  Result Value Ref Range   Salicylate Lvl 123456 2.8 - 30.0 mg/dL  Acetaminophen level  Result Value Ref Range   Acetaminophen (Tylenol), Serum <10 (L) 10 - 30 ug/mL  cbc  Result Value Ref Range   WBC 13.0 (H) 4.0 - 10.5 K/uL   RBC 4.93 3.87 - 5.11 MIL/uL   Hemoglobin 16.6 (H) 12.0 - 15.0 g/dL   HCT 48.7 (H) 36.0 - 46.0 %   MCV 98.8 78.0 - 100.0 fL   MCH 33.7 26.0 - 34.0 pg   MCHC 34.1 30.0 - 36.0 g/dL   RDW 14.1 11.5 - 15.5 %   Platelets 295 150 - 400 K/uL   I have personally reviewed and evaluated these lab results  as part of my medical decision-making.   MDM   Final diagnoses:  Polysubstance abuse  Alcohol intoxication, uncomplicated ([place])  Polycythemia    Alcohol abuse home with prescription drug abuse. No evidence of acute psychiatric condition. She is given referrals to substance abuse treatment programs. Old records are reviewed, and she has no relevant past visits.  I personally performed the services described in this documentation, which was scribed in my presence. The recorded information has been reviewed and is accurate.      Delora Fuel, MD [phone] [id number]

## 2015-09-01 NOTE — Discharge Instructions (Signed)
Substance Abuse Treatment Programs ° °Intensive Outpatient Programs °[facility]     °[address]      °[place], [place]                   °[phone]      ° °[facility] °[address] °[place], [place] °[phone] ° °[facility]     °(Inpatient and outpatient)     °[address]           °[phone]   ° °[facility] °[phone] (Suboxone and Methadone) ° °[address]      °[place], [place] [address]      °[phone]      ° °[address] °[place], [place] °[phone] ° °[facility] (Outpatient/Inpatient, [facility])    °(insurance only) [phone]      °       °[facility] (Groups & Residential) °[place] °[phone] ° °   °[facility]     °[address]     °[place], [place]      °[phone]      ° °[facility] (for caregivers and family) °[address] °[place], [place] °[phone] ° ° ° ° ° °Residential Treatment Programs °[facility]      °[address], [place], [place] [address]  °[phone]      ° °[facility] °[address] [place], [place] [address] °[phone] ° °[facility]        °[phone]      ° °[facility] °[phone] ° °[facility]             °[address]                                         °[place], [place]                                                °[phone] or [phone]                              ° °[facility] [place] °[address] °[place], [address] °[phone] ° °[facility]    °[address]      °[place], [place]     °[phone]      ° °[facility] °[address] °, [place] °[phone] ° °[facility]   °[address]     °[place], [place] [address]     °[phone]      °Admissions: 8am-3pm M-F ° °[facility] °[address] °[place],  [place] °[phone] ° °[facility]: Residential Program (90 Days)   °[place], [place]      °[phone] or [phone]    ° °[facility] °[place], [place] °(Walk in Hours over the weekend or by referral) ° °[facility] °[address], [place], [place] [address] °[phone] ° °Crisis Mobile: [facility]:  1-877-626-1772 (for crisis response 24 hours a day) °[facility] Hotline:      1-800-256-2452 °Outpatient Psychiatry and Counseling ° °[facility]: Mobile Crisis   Management 24 hours:  [phone]  [facility] of the [employer] sliding scale fee and walk in schedule: M-F 8am-12pm/1pm-3pm [address]  [place], [place] [address] [place], [place] [address] [phone]  [facility] ([facility])- new patient walk-in appointments available Monday - Friday 8am -3pm.          [address] [place], [place] [address] [phone] or crisis line- [facility]/ Intensive Outpatient Therapy Program [place], [place] [address] [place]      [phone] [place], [place] [address]                 [place]   [facility] [phone] [place], [place] [address]   [facility]          [address] Johnette Abraham  [place], [place] [address]       [phone]  [facility] [address] [place], [place] [address] [phone]  [facility]    [address] [place], [place] [address]     [place], [place] Joycelyn Man     [place] [address]     [phone]       [facility] [place] [address]  [facility]     [address], [place], [place], [place] [address] [place]     [address]     [place], [place] [address]     [phone]       [facility] [address] [place], [place] [address] [phone] Resources for Temporary Residential Assistance/Crisis [facility]) M-F 8am-3pm   [address] [place], [place] [address]   [phone] Services include: laundry, barbering, support groups, case management, phone  & computer access, showers, AA/NA mtgs, mental health/substance abuse nurse, job skills class, disability information, VA assistance, spiritual classes, etc.   [facility]   [address], [place]     [place]              [employer] (women and children)       [place]. [place], [place] [address] [phone] [email or web address] for application and process Application Required  [facility]   [address]    [place] [address]     [phone]                    [place],  [address] [phone](OILNZVJK application appt.) Application Required  [facility] (women only)    [address]     [place],  [address]     [phone]  Intake starts 6pm daily Need valid ID, SSC, & Police report [facility] [employer] [address] [place], [place] [phone] Application Required  [employer] (men only)     [place]      [place], [place]       [place] (Pregnant women only) [address] [place], [place]  [facility]      [place] [address]      [place], [place] [address]     [phone]             [facility] [address] [place], [place] 90 day commitment/SA/Application process  [facility](men only)     [address]     [place], [place]       Check-in at [facility] [address] [place],  [address] [phone] Men/Women/Women and Children must be there by 7 pm  [place], [place]                  Alcohol Use Disorder Alcohol use disorder is a mental disorder. It is not a one-time incident of heavy drinking. Alcohol use disorder is the excessive and uncontrollable use of alcohol over time that leads to problems with functioning in one or more areas of daily living. People with this disorder risk harming themselves and others when they drink to excess. Alcohol use disorder also can cause other mental disorders, such as mood and anxiety disorders, and serious physical problems. People with alcohol use disorder often misuse other drugs.  Alcohol use disorder is common and widespread. Some people with this disorder drink alcohol to cope with or escape from negative life events. Others drink to relieve chronic pain or symptoms of mental illness. People with a family history of alcohol use disorder are at higher risk of losing control and using alcohol to excess.  Drinking too much alcohol can cause injury, accidents, and health problems. One drink can be too much when you are:  Working.  Pregnant or breastfeeding.  Taking medicines. Ask your doctor.  Driving or planning to drive. SYMPTOMS  Signs and symptoms of alcohol use disorder may include the following:   Consumption ofalcohol inlarger amounts or over a longer period of time than intended.  Multiple unsuccessful attempts to cutdown or control alcohol use.   A great deal of time spent  obtaining alcohol, using alcohol, or recovering from the effects of alcohol (hangover).  A strong desire or urge to use alcohol (cravings).   Continued use of alcohol despite problems at work, school, or home because of alcohol use.   Continued use of alcohol despite problems in relationships because of alcohol use.  Continued use of alcohol in situations when it is physically hazardous, such as driving a car.  Continued use of alcohol despite awareness of a physical or psychological problem that is likely related to alcohol use. Physical problems related to alcohol use can involve the brain, heart, liver, stomach, and intestines. Psychological problems related to alcohol use include intoxication, depression, anxiety, psychosis, delirium, and dementia.   The need for increased amounts of alcohol to achieve the same desired effect, or a decreased effect from the consumption of the same amount of alcohol (tolerance).  Withdrawal symptoms upon reducing  or stopping alcohol use, or alcohol use to reduce or avoid withdrawal symptoms. Withdrawal symptoms include:  Racing heart.  Hand tremor.  Difficulty sleeping.  Nausea.  Vomiting.  Hallucinations.  Restlessness.  Seizures. DIAGNOSIS Alcohol use disorder is diagnosed through an assessment by your health care provider. Your health care provider may start by asking three or four questions to screen for excessive or problematic alcohol use. To confirm a diagnosis of alcohol use disorder, at least two symptoms must be present within a 14-month period. The severity of alcohol use disorder depends on the number of symptoms:  Mild--two or three.  Moderate--four or five.  Severe--six or more. Your health care provider may perform a physical exam or use results from lab tests to see if you have physical problems resulting from alcohol use. Your health care provider may refer you to a mental health professional for evaluation. TREATMENT   Some people with alcohol use disorder are able to reduce their alcohol use to low-risk levels. Some people with alcohol use disorder need to quit drinking alcohol. When necessary, mental health professionals with specialized training in substance use treatment can help. Your health care provider can help you decide how severe your alcohol use disorder is and what type of treatment you need. The following forms of treatment are available:   Detoxification. Detoxification involves the use of prescription medicines to prevent alcohol withdrawal symptoms in the first week after quitting. This is important for people with a history of symptoms of withdrawal and for heavy drinkers who are likely to have withdrawal symptoms. Alcohol withdrawal can be dangerous and, in severe cases, cause death. Detoxification is usually provided in a hospital or in-patient substance use treatment facility.  Counseling or talk therapy. Talk therapy is provided by substance use treatment counselors. It addresses the reasons people use alcohol and ways to keep them from drinking again. The goals of talk therapy are to help people with alcohol use disorder find healthy activities and ways to cope with life stress, to identify and avoid triggers for alcohol use, and to handle cravings, which can cause relapse.  Medicines.Different medicines can help treat alcohol use disorder through the following actions:  Decrease alcohol cravings.  Decrease the positive reward response felt from alcohol use.  Produce an uncomfortable physical reaction when alcohol is used (aversion therapy).  Support groups. Support groups are run by people who have quit drinking. They provide emotional support, advice, and guidance. These forms of treatment are often combined. Some people with alcohol use disorder benefit from intensive combination treatment provided by specialized substance use treatment centers. Both inpatient and outpatient treatment  programs are available.   This information is not intended to replace advice given to you by your health care provider. Make sure you discuss any questions you have with your health care provider.   Document Released: 03/19/2004 Document Revised: 03/02/2014 Document Reviewed: 05/19/2012 Elsevier Interactive Patient Education [employer].

## 2015-09-01 NOTE — ED Notes (Addendum)
Pt family member up to nurse first stating that the pt just went outside and took more xanax's and drank more alcohol. Pt denies that she had more ETOH, states that she "just finished off the bottle" (of xanax). Reports that she took the last two xanax in the bottle, unsure of mg dosage. States total xanax pills tonight "3 or maybe just 2.5". Pt denies SI, now stating she is "just depressed".

## 2015-09-01 NOTE — ED Notes (Signed)
Pt departed in NAD.  

## 2015-09-03 LAB — POC URINE PREG, ED: PREG TEST UR: NEGATIVE

## 2016-02-12 ENCOUNTER — Emergency Department (HOSPITAL_COMMUNITY)
Admission: EM | Admit: 2016-02-12 | Discharge: 2016-02-12 | Disposition: A | Discharge status: Home / Self Care | Payer: Self-pay | Attending: Emergency Medicine | Admitting: Emergency Medicine

## 2016-02-12 ENCOUNTER — Emergency Department (HOSPITAL_COMMUNITY): Payer: Self-pay

## 2016-02-12 ENCOUNTER — Encounter (HOSPITAL_COMMUNITY): Payer: Self-pay | Admitting: Neurology

## 2016-02-12 DIAGNOSIS — F17210 Nicotine dependence, cigarettes, uncomplicated: Secondary | ICD-10-CM | POA: Insufficient documentation

## 2016-02-12 DIAGNOSIS — J40 Bronchitis, not specified as acute or chronic: Secondary | ICD-10-CM | POA: Insufficient documentation

## 2016-02-12 DIAGNOSIS — R0789 Other chest pain: Secondary | ICD-10-CM | POA: Insufficient documentation

## 2016-02-12 LAB — CBC
HEMATOCRIT: 48.6 % — AB (ref 36.0–46.0)
Hemoglobin: 16.3 g/dL — ABNORMAL HIGH (ref 12.0–15.0)
MCH: 32.5 pg (ref 26.0–34.0)
MCHC: 33.5 g/dL (ref 30.0–36.0)
MCV: 97 fL (ref 78.0–100.0)
PLATELETS: 312 10*3/uL (ref 150–400)
RBC: 5.01 MIL/uL (ref 3.87–5.11)
RDW: 13.6 % (ref 11.5–15.5)
WBC: 12.4 10*3/uL — AB (ref 4.0–10.5)

## 2016-02-12 LAB — I-STAT BETA HCG BLOOD, ED (MC, WL, AP ONLY)

## 2016-02-12 LAB — BASIC METABOLIC PANEL
Anion gap: 8 (ref 5–15)
BUN: 5 mg/dL — AB (ref 6–20)
CALCIUM: 9.2 mg/dL (ref 8.9–10.3)
CO2: 25 mmol/L (ref 22–32)
CREATININE: 0.69 mg/dL (ref 0.44–1.00)
Chloride: 103 mmol/L (ref 101–111)
GFR calc Af Amer: >60 mL/min (ref >60)
Glucose, Bld: 99 mg/dL (ref 65–99)
POTASSIUM: 4 mmol/L (ref 3.5–5.1)
SODIUM: 136 mmol/L (ref 135–145)

## 2016-02-12 LAB — I-STAT TROPONIN, ED: TROPONIN I, POC: 0 ng/mL (ref 0.00–0.08)

## 2016-02-12 LAB — D-DIMER, QUANTITATIVE: D-Dimer, Quant: 0.38 ug/mL-FEU (ref 0.00–0.50)

## 2016-02-12 MED ORDER — IBUPROFEN 600 MG PO TABS: 600.0000 mg | ORAL_TABLET | Freq: Three times a day (TID) | ORAL | 0 refills | Status: DC

## 2016-02-12 MED ORDER — BENZONATATE 100 MG PO CAPS: 100.0000 mg | ORAL_CAPSULE | Freq: Three times a day (TID) | ORAL | 0 refills | Status: DC | PRN

## 2016-02-12 MED ORDER — KETOROLAC TROMETHAMINE 30 MG/ML IJ SOLN
30.0000 mg | Freq: Once | INTRAMUSCULAR | Status: AC
  Administered 2016-02-12: 30 mg via INTRAVENOUS
  Filled 2016-02-12: qty 1

## 2016-02-12 MED ORDER — METHOCARBAMOL 500 MG PO TABS
1000.0000 mg | ORAL_TABLET | Freq: Once | ORAL | Status: AC
Start: 2016-02-12 — End: 2016-02-12
  Administered 2016-02-12: 1000 mg via ORAL
  Filled 2016-02-12: qty 2

## 2016-02-12 MED ORDER — METHOCARBAMOL 500 MG PO TABS: 1000.0000 mg | ORAL_TABLET | Freq: Four times a day (QID) | ORAL | 0 refills | Status: DC | PRN

## 2016-02-12 NOTE — ED Provider Notes (Signed)
[facility] DEPT Provider Note   CSN: [id number] Arrival date & time: 02/12/16  1615     History   Chief Complaint Chief Complaint  Patient presents with  . Chest Pain  . Cough    HPI Catherine Cannon is a 35 y.o. female.  HPI Patient presents with constant left-sided chest pain for the past 3 days. The pain is worse with deep breathing or movement. States it's in the left upper chest and radiates to the left thoracic back. Denies any trauma. No new lower extremity swelling or pain. No family history of PE/CAD. Patient's had an ongoing cough for the past 3 weeks. She's had intermittent sputum production. Subjective fevers and chills. History reviewed. No pertinent past medical history.  There are no active problems to display for this patient.   History reviewed. No pertinent surgical history.  OB History    No data available       Home Medications    Prior to Admission medications   Medication Sig Start Date End Date Taking? Authorizing Provider  guaiFENesin (MUCINEX) 600 MG 12 hr tablet Take 600 mg by mouth 2 (two) times daily as needed for cough or to loosen phlegm.    Yes Historical Provider, MD  benzonatate (TESSALON) 100 MG capsule Take 1 capsule (100 mg total) by mouth 3 (three) times daily as needed for cough. 02/12/16   Julianne Rice, MD  ibuprofen (ADVIL,MOTRIN) 600 MG tablet Take 1 tablet (600 mg total) by mouth 3 (three) times daily after meals. 02/12/16   Julianne Rice, MD  methocarbamol (ROBAXIN) 500 MG tablet Take 2 tablets (1,000 mg total) by mouth every 6 (six) hours as needed. 02/12/16   Julianne Rice, MD    Family History Family History  Problem Relation Age of Onset  . Asthma Mother   . Cancer Other     Social History Social History  Substance Use Topics  . Smoking status: Current Every Day Smoker    Packs/day: 1.00    Types: Cigarettes  . Smokeless tobacco: Not on file  . Alcohol use Yes     Comment: occ      Allergies     Patient has no known allergies.   Review of Systems Review of Systems  Constitutional: Positive for chills and fever.  HENT: Negative for congestion, sinus pressure and sore throat.   Respiratory: Positive for cough. Negative for shortness of breath.   Cardiovascular: Positive for chest pain. Negative for palpitations and leg swelling.  Gastrointestinal: Negative for abdominal pain, constipation, diarrhea, nausea and vomiting.  Genitourinary: Negative for dysuria, flank pain and frequency.  Musculoskeletal: Positive for back pain and myalgias. Negative for neck pain and neck stiffness.  Skin: Negative for rash and wound.  Neurological: Negative for dizziness, weakness, light-headedness, numbness and headaches.  All other systems reviewed and are negative.    Physical Exam Updated Vital Signs BP 143/95   Pulse 61   Temp 98.6 F (37 C) (Oral)   Resp 12   Ht 5\' 8"  (1.727 m)   Wt 210 lb (95.3 kg)   LMP 02/11/2016   SpO2 96%   BMI 31.93 kg/m   Physical Exam  Constitutional: She is oriented to person, place, and time. She appears well-developed and well-nourished.  HENT:  Head: Normocephalic and atraumatic.  Mouth/Throat: Oropharynx is clear and moist. No oropharyngeal exudate.  Eyes: EOM are normal. Pupils are equal, round, and reactive to light.  Neck: Normal range of motion. Neck supple. No JVD present.  Cardiovascular: Normal rate and regular rhythm.  Exam reveals no gallop and no friction rub.   No murmur heard. Pulmonary/Chest: Effort normal and breath sounds normal. No respiratory distress. She has no wheezes. She has no rales. She exhibits tenderness (chest wall tenderness to palpation. This is mostly in the left upper chest. No crepitance or deformity.).  Abdominal: Soft. Bowel sounds are normal. There is no tenderness. There is no rebound and no guarding.  Musculoskeletal: Normal range of motion. She exhibits tenderness. She exhibits no edema.  Patient has tenderness  to palpation over the medial border of the left scapula and left trapezius. No midline thoracic or lumbar tenderness. No CVA tenderness. No lower extremity swelling, asymmetry or tenderness. Distal pulses equal throughout  Neurological: She is alert and oriented to person, place, and time.  Moves all extremities without deficit. Sensation fully intact.  Skin: Skin is warm and dry. No rash noted. No erythema.  Psychiatric: She has a normal mood and affect. Her behavior is normal.  Nursing note and vitals reviewed.    ED Treatments / Results  Labs (all labs ordered are listed, but only abnormal results are displayed) Labs Reviewed  BASIC METABOLIC PANEL - Abnormal; Notable for the following:       Result Value   BUN 5 (*)    All other components within normal limits  CBC - Abnormal; Notable for the following:    WBC 12.4 (*)    Hemoglobin 16.3 (*)    HCT 48.6 (*)    All other components within normal limits  D-DIMER, QUANTITATIVE (NOT AT [facility])  Randolm Idol, ED  I-STAT BETA HCG BLOOD, ED ([facility], [facility], [facility] ONLY)    EKG  EKG Interpretation  Date/Time:  Wednesday February 12 2016 16:20:28 EST Ventricular Rate:  82 PR Interval:  126 QRS Duration: 82 QT Interval:  366 QTC Calculation: 427 R Axis:   51 Text Interpretation:  Normal sinus rhythm Normal ECG Confirmed by Lita Mains  MD, Kayelyn Lemon ([id number]) on 02/12/2016 6:41:14 PM Also confirmed by Lita Mains  MD, Selinda Korzeniewski ([id number]), editor [place]  CCT, BEVERLY ([id number])  on 02/13/2016 10:57:36 AM       Radiology Dg Chest 2 View  Result Date: 02/12/2016 CLINICAL DATA:  Chest pain for 2 months, productive cough. History of chronic bronchitis, smoker. EXAM: CHEST  2 VIEW COMPARISON:  Chest radiograph August 11, 2015 FINDINGS: Cardiac silhouette appears mildly enlarged, mediastinal silhouette is unremarkable. Similar bronchitic changes without pleural effusion or focal consolidation. No pneumothorax. Soft tissue planes and included osseous structures  are nonsuspicious. IMPRESSION: Mild cardiomegaly. Similar bronchitic changes without focal consolidation. Electronically Signed   By: Elon Alas M.D.   On: 02/12/2016 17:22    Procedures Procedures (including critical care time)  Medications Ordered in ED Medications  ketorolac (TORADOL) 30 MG/ML injection 30 mg (30 mg Intravenous Given 02/12/16 1936)  methocarbamol (ROBAXIN) tablet 1,000 mg (1,000 mg Oral Given 02/12/16 1935)     Initial Impression / Assessment and Plan / ED Course  I have reviewed the triage vital signs and the nursing notes.  Pertinent labs & imaging results that were available during my care of the patient were reviewed by me and considered in my medical decision making (see chart for details).  Clinical Course     Patient feels better after medication. No suspicion for coronary artery disease. Troponin and d-dimer are normal. We'll treat for muscle skeletal pain. Return precautions given.  Final Clinical Impressions(s) / ED Diagnoses   Final diagnoses:  Chest wall pain  Bronchitis    New Prescriptions Discharge Medication List as of 02/12/2016  8:25 PM    START taking these medications   Details  benzonatate (TESSALON) 100 MG capsule Take 1 capsule (100 mg total) by mouth 3 (three) times daily as needed for cough., Starting Wed 02/12/2016, Print    methocarbamol (ROBAXIN) 500 MG tablet Take 2 tablets (1,000 mg total) by mouth every 6 (six) hours as needed., Starting Wed 02/12/2016, Print         Julianne Rice, MD 02/13/16 [phone]

## 2016-02-12 NOTE — ED Triage Notes (Signed)
Pt reports cough for several weeks, 3 days ago developed left sided CP radiating into her back, is tender to palpation. CP is constant. She feels like she pulled something coughing.

## 2016-02-16 ENCOUNTER — Emergency Department (HOSPITAL_COMMUNITY)
Admission: EM | Admit: 2016-02-16 | Discharge: 2016-02-16 | Disposition: A | Discharge status: Home / Self Care | Payer: Self-pay | Attending: Emergency Medicine | Admitting: Emergency Medicine

## 2016-02-16 ENCOUNTER — Emergency Department (HOSPITAL_COMMUNITY): Payer: Self-pay

## 2016-02-16 ENCOUNTER — Encounter (HOSPITAL_COMMUNITY): Payer: Self-pay | Admitting: Emergency Medicine

## 2016-02-16 DIAGNOSIS — J189 Pneumonia, unspecified organism: Secondary | ICD-10-CM | POA: Insufficient documentation

## 2016-02-16 DIAGNOSIS — F17210 Nicotine dependence, cigarettes, uncomplicated: Secondary | ICD-10-CM | POA: Insufficient documentation

## 2016-02-16 MED ORDER — IBUPROFEN 800 MG PO TABS
800.0000 mg | ORAL_TABLET | Freq: Once | ORAL | Status: AC
  Administered 2016-02-16: 800 mg via ORAL
  Filled 2016-02-16 (×2): qty 1

## 2016-02-16 MED ORDER — ALBUTEROL SULFATE HFA 108 (90 BASE) MCG/ACT IN AERS
2.0000 | INHALATION_SPRAY | Freq: Once | RESPIRATORY_TRACT | Status: AC
  Administered 2016-02-16: 2 via RESPIRATORY_TRACT
  Filled 2016-02-16: qty 6.7

## 2016-02-16 MED ORDER — ACETAMINOPHEN 500 MG PO TABS
1000.0000 mg | ORAL_TABLET | Freq: Once | ORAL | Status: AC
  Administered 2016-02-16: 1000 mg via ORAL
  Filled 2016-02-16: qty 2

## 2016-02-16 MED ORDER — AMOXICILLIN 500 MG PO CAPS: 500.0000 mg | ORAL_CAPSULE | Freq: Three times a day (TID) | ORAL | 0 refills | Status: DC

## 2016-02-16 MED ORDER — AZITHROMYCIN 250 MG PO TABS: ORAL_TABLET | ORAL | 0 refills | Status: DC

## 2016-02-16 NOTE — ED Triage Notes (Signed)
Pt sts cough and pain with cough x 1 week; pt seen here for same and given meds that are not helping

## 2016-02-16 NOTE — Discharge Instructions (Signed)
If you were given medicines take as directed.  If you are on coumadin or contraceptives realize their levels and effectiveness is altered by many different medicines.  If you have any reaction (rash, tongues swelling, other) to the medicines stop taking and see a physician.    If your blood pressure was elevated in the ER make sure you follow up for management with a primary doctor or return for chest pain, shortness of breath or stroke symptoms.  Please follow up as directed and return to the ER or see a physician for new or worsening symptoms.  Thank you. Vitals:   02/16/16 1443 02/16/16 1625  BP: 142/99   Pulse: 82   Resp: 18   Temp: 97.6 F (36.4 C)   TempSrc: Oral   SpO2: 95%   Weight:  210 lb (95.3 kg)  Height:  5' 8.5" (1.74 m)

## 2016-02-16 NOTE — ED Provider Notes (Signed)
[facility] DEPT Provider Note   CSN: [id number] Arrival date & time: 02/16/16  1438     History   Chief Complaint Chief Complaint  Patient presents with  . Cough    HPI Catherine Cannon is a 35 y.o. female.  Patient with severe smoking history no other significant medical problems presents with recurrent cough for the past few weeks. Patient had a workup done which per her report was unremarkable. No recent antibiotics, no blood clot risk factors. Patient has rib pain on the left worse with coughing. No unilateral leg swelling.      History reviewed. No pertinent past medical history.  There are no active problems to display for this patient.   History reviewed. No pertinent surgical history.  OB History    No data available       Home Medications    Prior to Admission medications   Medication Sig Start Date End Date Taking? Authorizing Provider  benzonatate (TESSALON) 100 MG capsule Take 1 capsule (100 mg total) by mouth 3 (three) times daily as needed for cough. 02/12/16   Julianne Rice, MD  guaiFENesin (MUCINEX) 600 MG 12 hr tablet Take 600 mg by mouth 2 (two) times daily as needed for cough or to loosen phlegm.     Historical Provider, MD  ibuprofen (ADVIL,MOTRIN) 600 MG tablet Take 1 tablet (600 mg total) by mouth 3 (three) times daily after meals. 02/12/16   Julianne Rice, MD  methocarbamol (ROBAXIN) 500 MG tablet Take 2 tablets (1,000 mg total) by mouth every 6 (six) hours as needed. 02/12/16   Julianne Rice, MD    Family History Family History  Problem Relation Age of Onset  . Asthma Mother   . Cancer Other     Social History Social History  Substance Use Topics  . Smoking status: Current Every Day Smoker    Packs/day: 1.00    Types: Cigarettes  . Smokeless tobacco: Not on file  . Alcohol use Yes     Comment: occ      Allergies   Patient has no known allergies.   Review of Systems Review of Systems  Constitutional: Negative for  chills and fever.  HENT: Negative for ear pain and sore throat.   Eyes: Negative for pain and visual disturbance.  Respiratory: Positive for cough. Negative for shortness of breath.   Cardiovascular: Negative for palpitations and leg swelling.  Gastrointestinal: Negative for abdominal pain and vomiting.  Genitourinary: Negative for dysuria and hematuria.  Musculoskeletal: Negative for arthralgias and back pain.  Skin: Negative for color change and rash.  Neurological: Negative for seizures and syncope.  All other systems reviewed and are negative.    Physical Exam Updated Vital Signs BP 142/99 (BP Location: Right Arm)   Pulse 82   Temp 97.6 F (36.4 C) (Oral)   Resp 18   Ht 5' 8.5" (1.74 m)   Wt 210 lb (95.3 kg)   LMP 02/11/2016   SpO2 95%   BMI 31.47 kg/m   Physical Exam  Constitutional: She appears well-developed and well-nourished. No distress.  HENT:  Head: Normocephalic and atraumatic.  Eyes: Conjunctivae are normal.  Neck: Neck supple.  Cardiovascular: Normal rate and regular rhythm.   Pulmonary/Chest: Effort normal. No respiratory distress. She has wheezes (mild expiratory bilateral).  Abdominal: Soft. There is no tenderness.  Musculoskeletal: She exhibits no edema.  Neurological: She is alert.  Skin: Skin is warm and dry.  Psychiatric: She has a normal mood and affect.  Nursing  note and vitals reviewed.    ED Treatments / Results  Labs (all labs ordered are listed, but only abnormal results are displayed) Labs Reviewed - No data to display  EKG  EKG Interpretation  Date/Time:  Sunday February 16 2016 16:34:32 EST Ventricular Rate:  65 PR Interval:    QRS Duration: 98 QT Interval:  429 QTC Calculation: 447 R Axis:   25 Text Interpretation:  Sinus rhythm Low voltage, precordial leads Confirmed by Demarko Zeimet MD, Vonna Kotyk [id number]) on 02/16/2016 4:38:05 PM       Radiology Dg Chest 2 View  Result Date: 02/16/2016 CLINICAL DATA:  Shortness of breath,  left chest pain, cough x8 weeks EXAM: CHEST  2 VIEW COMPARISON:  02/12/2016 FINDINGS: Mild patchy opacity in the lingula, more conspicuous on the lateral view, suspicious for pneumonia. No pleural effusion or pneumothorax. The heart is normal in size. Visualized osseous structures are within normal limits. IMPRESSION: Mild patchy opacity in the lingula, suspicious for pneumonia. Electronically Signed   By: Julian Hy M.D.   On: 02/16/2016 15:04    Procedures Procedures (including critical care time)  Medications Ordered in ED Medications  ibuprofen (ADVIL,MOTRIN) tablet 800 mg (not administered)  acetaminophen (TYLENOL) tablet 1,000 mg (not administered)  albuterol (PROVENTIL HFA;VENTOLIN HFA) 108 (90 Base) MCG/ACT inhaler 2 puff (2 puffs Inhalation Given 02/16/16 1639)     Initial Impression / Assessment and Plan / ED Course  I have reviewed the triage vital signs and the nursing notes.  Pertinent labs & imaging results that were available during my care of the patient were reviewed by me and considered in my medical decision making (see chart for details).  Clinical Course    Patient presents with clinical concern for pneumonia. Chest x-ray reviewed consistent. Patient not requiring oxygen no respiratory difficulty in the ER. Plan for oral antibiotics and reassessment in 2-3 days.  Results and differential diagnosis were discussed with the patient/parent/guardian. Xrays were independently reviewed by myself.  Close follow up outpatient was discussed, comfortable with the plan.   Medications  ibuprofen (ADVIL,MOTRIN) tablet 800 mg (not administered)  acetaminophen (TYLENOL) tablet 1,000 mg (not administered)  albuterol (PROVENTIL HFA;VENTOLIN HFA) 108 (90 Base) MCG/ACT inhaler 2 puff (2 puffs Inhalation Given 02/16/16 1639)    Vitals:   02/16/16 1443 02/16/16 1625  BP: 142/99   Pulse: 82   Resp: 18   Temp: 97.6 F (36.4 C)   TempSrc: Oral   SpO2: 95%   Weight:  210  lb (95.3 kg)  Height:  5' 8.5" (1.74 m)    Final diagnoses:  Community acquired pneumonia, unspecified laterality      Final Clinical Impressions(s) / ED Diagnoses   Final diagnoses:  Community acquired pneumonia, unspecified laterality    New Prescriptions New Prescriptions   No medications on file     Elnora Morrison, MD 02/16/16 1640

## 2016-03-16 ENCOUNTER — Emergency Department (HOSPITAL_COMMUNITY): Payer: Self-pay

## 2016-03-16 ENCOUNTER — Encounter (HOSPITAL_COMMUNITY): Payer: Self-pay | Admitting: Emergency Medicine

## 2016-03-16 ENCOUNTER — Emergency Department (HOSPITAL_COMMUNITY)
Admission: EM | Admit: 2016-03-16 | Discharge: 2016-03-16 | Disposition: A | Discharge status: Home / Self Care | Payer: Self-pay | Attending: Emergency Medicine | Admitting: Emergency Medicine

## 2016-03-16 DIAGNOSIS — Z7982 Long term (current) use of aspirin: Secondary | ICD-10-CM | POA: Insufficient documentation

## 2016-03-16 DIAGNOSIS — F17210 Nicotine dependence, cigarettes, uncomplicated: Secondary | ICD-10-CM | POA: Insufficient documentation

## 2016-03-16 DIAGNOSIS — R109 Unspecified abdominal pain: Secondary | ICD-10-CM

## 2016-03-16 DIAGNOSIS — R1030 Lower abdominal pain, unspecified: Secondary | ICD-10-CM | POA: Insufficient documentation

## 2016-03-16 DIAGNOSIS — Z79899 Other long term (current) drug therapy: Secondary | ICD-10-CM | POA: Insufficient documentation

## 2016-03-16 LAB — I-STAT CG4 LACTIC ACID, ED
LACTIC ACID, VENOUS: 1.83 mmol/L (ref 0.5–1.9)
Lactic Acid, Venous: 3.34 mmol/L (ref 0.5–1.9)
Lactic Acid, Venous: 2.06 mmol/L (ref 0.5–1.9)

## 2016-03-16 LAB — COMPREHENSIVE METABOLIC PANEL
ALK PHOS: 93 U/L (ref 38–126)
ALT: 53 U/L (ref 14–54)
AST: 53 U/L — ABNORMAL HIGH (ref 15–41)
Albumin: 3.7 g/dL (ref 3.5–5.0)
Anion gap: 11 (ref 5–15)
BUN: <5 mg/dL — ABNORMAL LOW (ref 6–20)
CALCIUM: 9.5 mg/dL (ref 8.9–10.3)
CHLORIDE: 102 mmol/L (ref 101–111)
CO2: 25 mmol/L (ref 22–32)
CREATININE: 0.53 mg/dL (ref 0.44–1.00)
Glucose, Bld: 103 mg/dL — ABNORMAL HIGH (ref 65–99)
Potassium: 3.6 mmol/L (ref 3.5–5.1)
Sodium: 138 mmol/L (ref 135–145)
Total Bilirubin: 0.3 mg/dL (ref 0.3–1.2)
Total Protein: 6.9 g/dL (ref 6.5–8.1)

## 2016-03-16 LAB — URINALYSIS, ROUTINE W REFLEX MICROSCOPIC
Bilirubin Urine: NEGATIVE
GLUCOSE, UA: NEGATIVE mg/dL
KETONES UR: NEGATIVE mg/dL
Nitrite: NEGATIVE
PROTEIN: NEGATIVE mg/dL
Specific Gravity, Urine: 1.006 (ref 1.005–1.030)
pH: 7 (ref 5.0–8.0)

## 2016-03-16 LAB — CBC
HCT: 48.7 % — ABNORMAL HIGH (ref 36.0–46.0)
Hemoglobin: 16.3 g/dL — ABNORMAL HIGH (ref 12.0–15.0)
MCH: 32.5 pg (ref 26.0–34.0)
MCHC: 33.5 g/dL (ref 30.0–36.0)
MCV: 97 fL (ref 78.0–100.0)
Platelets: 302 10*3/uL (ref 150–400)
RBC: 5.02 MIL/uL (ref 3.87–5.11)
RDW: 14.2 % (ref 11.5–15.5)
WBC: 9.9 10*3/uL (ref 4.0–10.5)

## 2016-03-16 LAB — I-STAT BETA HCG BLOOD, ED (MC, WL, AP ONLY): I-stat hCG, quantitative: <5 m[IU]/mL (ref <5)

## 2016-03-16 MED ORDER — SODIUM CHLORIDE 0.9 % IV BOLUS (SEPSIS)
1000.0000 mL | Freq: Once | INTRAVENOUS | Status: AC
  Administered 2016-03-16: 1000 mL via INTRAVENOUS

## 2016-03-16 MED ORDER — ONDANSETRON HCL 4 MG/2ML IJ SOLN
4.0000 mg | Freq: Once | INTRAMUSCULAR | Status: AC
  Administered 2016-03-16: 4 mg via INTRAVENOUS
  Filled 2016-03-16: qty 2

## 2016-03-16 MED ORDER — KETOROLAC TROMETHAMINE 30 MG/ML IJ SOLN
15.0000 mg | Freq: Once | INTRAMUSCULAR | Status: AC
  Administered 2016-03-16: 15 mg via INTRAVENOUS
  Filled 2016-03-16: qty 1

## 2016-03-16 MED ORDER — MORPHINE SULFATE (PF) 4 MG/ML IV SOLN
4.0000 mg | Freq: Once | INTRAVENOUS | Status: AC
  Administered 2016-03-16: 4 mg via INTRAVENOUS
  Filled 2016-03-16: qty 1

## 2016-03-16 NOTE — ED Notes (Signed)
Martie Round RN notified of Lactic

## 2016-03-16 NOTE — ED Triage Notes (Signed)
Pt here with pain in bilateral flank area; pt sts pain x 1 week worse today; pt had recent PNA and unsure if pain could be from coughing

## 2016-03-16 NOTE — ED Notes (Signed)
Pt transported to US

## 2016-03-16 NOTE — ED Notes (Signed)
Patient transported to Ultrasound 

## 2016-03-16 NOTE — ED Provider Notes (Signed)
[facility] DEPT Provider Note   CSN: [id number] Arrival date & time: 03/16/16  1445     History   Chief Complaint Chief Complaint  Patient presents with  . Flank Pain    HPI Catherine Cannon is a 36 y.o. female.  36 year old Caucasian female with no significant past medical history presents to the ED today with complaint of right flank pain. Patient states her pain started approximately 5 days ago. Patient felt that she injured her back at work. Patient to service. However the pain has increased. Movement makes the pain worse. Nothing makes pain better. She has tried Tylenol and ibuprofen at home without relief in her pain. Patient described a sharp constant pain. Patient was recently treated for pneumonia on 12/24. Continues to cough. Felt like maybe the coughing was causing her pain. Patient denies any urinary symptoms, vaginal symptoms. Patient denies any fever, chills, headache, vision changes, chest pain, shortness of breath, abdominal pain, nausea, emesis, urinary symptoms, change in bowel habits, numbness/tingling. Patient states her appetite and by mouth intake has been normal. Patient denies any URI symptoms, body aches. Denies any sick contacts.      History reviewed. No pertinent past medical history.  There are no active problems to display for this patient.   History reviewed. No pertinent surgical history.  OB History    No data available       Home Medications    Prior to Admission medications   Medication Sig Start Date End Date Taking? Authorizing Provider  acetaminophen (TYLENOL) 500 MG tablet Take 1,000 mg by mouth every 6 (six) hours as needed for moderate pain.    Yes Historical Provider, MD  albuterol (PROVENTIL HFA;VENTOLIN HFA) 108 (90 Base) MCG/ACT inhaler Inhale 2 puffs into the lungs every 6 (six) hours as needed for wheezing or shortness of breath.   Yes Historical Provider, MD  aspirin 325 MG tablet Take 325 mg by mouth every 6 (six) hours as  needed for moderate pain.   Yes Historical Provider, MD  guaifenesin (ROBITUSSIN) 100 MG/5ML syrup Take 200 mg by mouth 3 (three) times daily as needed for cough.   Yes Historical Provider, MD  ibuprofen (ADVIL,MOTRIN) 200 MG tablet Take 400 mg by mouth every 6 (six) hours as needed.   Yes Historical Provider, MD    Family History Family History  Problem Relation Age of Onset  . Asthma Mother   . Cancer Other     Social History Social History  Substance Use Topics  . Smoking status: Current Every Day Smoker    Packs/day: 1.00    Types: Cigarettes  . Smokeless tobacco: Not on file  . Alcohol use Yes     Comment: occ      Allergies   Patient has no known allergies.   Review of Systems Review of Systems  Constitutional: Negative for chills and fever.  HENT: Negative for congestion, rhinorrhea and sore throat.   Eyes: Negative for visual disturbance.  Respiratory: Positive for cough. Negative for shortness of breath and wheezing.   Cardiovascular: Negative for chest pain and palpitations.  Gastrointestinal: Negative for abdominal pain, constipation, diarrhea, nausea and vomiting.  Genitourinary: Positive for flank pain (right) and vaginal bleeding (currently on period). Negative for dysuria, hematuria, urgency and vaginal discharge.  Musculoskeletal: Negative for gait problem.  Skin: Negative for color change and wound.  Neurological: Negative for dizziness, syncope, weakness, light-headedness, numbness and headaches.  All other systems reviewed and are negative.    Physical Exam Updated  Vital Signs BP 154/95 (BP Location: Right Arm)   Pulse 77   Temp 98.4 F (36.9 C) (Oral)   Resp 19   Ht 5\' 9"  (1.753 m)   Wt 86.2 kg   LMP 03/12/2016   SpO2 96%   BMI 28.06 kg/m   Physical Exam  Constitutional: She is oriented to person, place, and time. She appears well-developed and well-nourished. No distress.  Patient does not appear to be any distress. Ambulated to the  bathroom with normal gait.  HENT:  Head: Normocephalic and atraumatic.  Mouth/Throat: Oropharynx is clear and moist.  Eyes: Conjunctivae are normal. Right eye exhibits no discharge. Left eye exhibits no discharge. No scleral icterus.  Neck: Normal range of motion. Neck supple. No thyromegaly present.  Cardiovascular: Normal rate, regular rhythm, normal heart sounds and intact distal pulses.  Exam reveals no gallop and no friction rub.   No murmur heard. The patient is not tachycardic.  Pulmonary/Chest: Effort normal and breath sounds normal. No respiratory distress. She has no wheezes. She exhibits no tenderness.  Patient is not hypoxic or tachypnea.  Abdominal: Soft. Bowel sounds are normal. She exhibits no distension. There is no tenderness. There is CVA tenderness (right). There is no rigidity, no rebound, no guarding, no tenderness at McBurney's point and negative Murphy's sign.  Musculoskeletal: Normal range of motion.  Lymphadenopathy:    She has no cervical adenopathy.  Neurological: She is alert and oriented to person, place, and time.  Skin: Skin is warm and dry. Capillary refill takes less than 2 seconds.  Nursing note and vitals reviewed.    ED Treatments / Results  Labs (all labs ordered are listed, but only abnormal results are displayed) Labs Reviewed  COMPREHENSIVE METABOLIC PANEL - Abnormal; Notable for the following:       Result Value   Glucose, Bld 103 (*)    BUN <5 (*)    AST 53 (*)    All other components within normal limits  CBC - Abnormal; Notable for the following:    Hemoglobin 16.3 (*)    HCT 48.7 (*)    All other components within normal limits  URINALYSIS, ROUTINE W REFLEX MICROSCOPIC - Abnormal; Notable for the following:    Hgb urine dipstick LARGE (*)    Leukocytes, UA TRACE (*)    Bacteria, UA RARE (*)    Squamous Epithelial / LPF 0-5 (*)    All other components within normal limits  I-STAT CG4 LACTIC ACID, ED - Abnormal; Notable for the  following:    Lactic Acid, Venous 3.34 (*)    All other components within normal limits  I-STAT CG4 LACTIC ACID, ED - Abnormal; Notable for the following:    Lactic Acid, Venous 2.06 (*)    All other components within normal limits  I-STAT BETA HCG BLOOD, ED ([facility], [facility], [facility] ONLY)  I-STAT CG4 LACTIC ACID, ED    EKG  EKG Interpretation None       Radiology Dg Chest 2 View  Result Date: 03/16/2016 CLINICAL DATA:  Right posterior to lateral mid chest pain. Dry cough. EXAM: CHEST  2 VIEW COMPARISON:  02/16/2016. FINDINGS: Trachea is midline. Heart size normal. Lungs are clear. No pleural fluid. IMPRESSION: No acute findings. Electronically Signed   By: Lorin Picket M.D.   On: 03/16/2016 15:53   US Renal  Result Date: 03/16/2016 CLINICAL DATA:  Right flank pain with CVA tenderness. EXAM: RENAL / URINARY TRACT ULTRASOUND COMPLETE COMPARISON:  CT scan 07/05/2009 FINDINGS:  Right Kidney: Length: 10.9 cm. Echogenicity within normal limits. No mass or hydronephrosis visualized. Left Kidney: Length: 11.1 cm. Echogenicity within normal limits. No mass or hydronephrosis visualized. Bladder: Decompressed. IMPRESSION: Unremarkable.  No evidence for hydronephrosis. Electronically Signed   By: Misty Stanley M.D.   On: 03/16/2016 19:28   Ct Renal Stone Study  Result Date: 03/16/2016 CLINICAL DATA:  RIGHT flank pain.  Assess for kidney stones. EXAM: CT ABDOMEN AND PELVIS WITHOUT CONTRAST TECHNIQUE: Multidetector CT imaging of the abdomen and pelvis was performed following the standard protocol without IV contrast. COMPARISON:  Abdominal ultrasound March 16, 2016 at 1912 hours and CT abdomen and pelvis Jul 05, 2009 FINDINGS: LOWER CHEST: Bibasilar atelectasis. Heart size is normal. No pericardial effusions. HEPATOBILIARY: Normal. PANCREAS: Normal. SPLEEN: Normal. ADRENALS/URINARY TRACT: Kidneys are orthotopic, demonstrating normal size and morphology. No nephrolithiasis, hydronephrosis; limited assessment for  renal masses on this nonenhanced examination. The unopacified ureters are normal in course and caliber. Urinary bladder is partially distended and unremarkable. Normal adrenal glands. STOMACH/BOWEL: The stomach, small and large bowel are normal in course and caliber without inflammatory changes, sensitivity decreased by lack of enteric contrast. Normal air-filled appendix. VASCULAR/LYMPHATIC: Aortoiliac vessels are normal in course and caliber. No lymphadenopathy by CT size criteria. REPRODUCTIVE: Normal. OTHER: No intraperitoneal free fluid or free air. MUSCULOSKELETAL: Non-acute. Mild LEFT sacroiliac undersurface spurring. IMPRESSION: No urolithiasis or obstructive uropathy ; negative noncontrast CT abdomen and pelvis. Electronically Signed   By: Elon Alas M.D.   On: 03/16/2016 22:44    Procedures Procedures (including critical care time)  Medications Ordered in ED Medications  sodium chloride 0.9 % bolus 1,000 mL (0 mLs Intravenous Stopped 03/16/16 2032)  morphine 4 MG/ML injection 4 mg (4 mg Intravenous Given 03/16/16 1849)  ondansetron (ZOFRAN) injection 4 mg (4 mg Intravenous Given 03/16/16 1848)  sodium chloride 0.9 % bolus 1,000 mL (0 mLs Intravenous Stopped 03/16/16 2205)  morphine 4 MG/ML injection 4 mg (4 mg Intravenous Given 03/16/16 2007)  ketorolac (TORADOL) 30 MG/ML injection 15 mg (15 mg Intravenous Given 03/16/16 2252)     Initial Impression / Assessment and Plan / ED Course  I have reviewed the triage vital signs and the nursing notes.  Pertinent labs & imaging results that were available during my care of the patient were reviewed by me and considered in my medical decision making (see chart for details).    Patient presents to the ED with complaint of right-sided flank pain. Patient denies any fevers, urinary symptoms, nausea, vomiting. Patient had basic lab work drawn in the lobby. Her lactate was noted to be 3.3. Patient was afebrile and not tachycardic. Pressures were  normotensive. Patient without any leukocytosis. Patient does have right-sided CVA tenderness. Patient does not meet Sirs criteria. Renal ultrasound was unremarkable. Patient does have significant amount of RBCs in her urine. Rare bacteria and leukocytes. Patient does have squamous epithelials. Likely a symptomatic bacteriuria. Patient denies any urinary symptoms. Patient is currently on her menstrual cycle which explains the hemoglobin in the urine. Cannot rule out nephrolithiasis. CT renal study was ordered. Shows no intra-abdominal abnormalities. Patient's pain has improved. Her lactic acidosis is improved with 2 L of fluid. Last lactate was 1.8. Patient has no signs of infectious process. This could be from mild dehydration given that her hemoglobin is elevated at 16. 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 Patient also states that she was diagnosed with pneumonia and treated in  December. Has been coughing which could have aggravated the pain. All other labs have been unremarkable. Patient is nontoxic appearing. Vital signs are stable. She has remained afebrile and not tachycardic in the ED. Her pressures have remained normotensive. Patient is ambulatory with normal gait. Low suspicion for UTI, pyelonephritis, nephrolithiasis. Abdominal exam is benign. Pt is hemodynamically stable, in NAD, & able to ambulate in the ED. Pain has been managed & has no complaints prior to dc. Pt is comfortable with above plan and is stable for discharge at this time. All questions were answered prior to disposition. Strict return precautions for f/u to the ED were discussed. Encouraged to follow up with pcp. Pt dicussed with Dr. Dayna Barker who is agreeable to the above plan.   Final Clinical Impressions(s) / ED Diagnoses   Final diagnoses:  Right flank pain    New Prescriptions Discharge Medication List as of 03/16/2016 11:04 PM       Doristine Devoid, PA-C 03/17/16 0000      Merrily Pew, MD 03/17/16 [phone]

## 2016-03-16 NOTE — ED Notes (Signed)
Pt ambulated to restroom, pt had no complaints of nausea or dizziness.

## 2016-03-16 NOTE — ED Notes (Signed)
Patient returned to room and placed back on monitor.  

## 2016-03-16 NOTE — ED Notes (Signed)
Dr. Goldston made aware of lactic acid 

## 2016-03-16 NOTE — Discharge Instructions (Signed)
Please drink plenty of fluid. Tylenol and Motrin for pain. Heating pad to affected area. Develop any infectious symptoms including cough, abdominal pain, urinary symptoms, diarrhea please return to the ED. Follow-up with a primary care doctor.

## 2016-03-23 ENCOUNTER — Emergency Department (HOSPITAL_COMMUNITY)
Admission: EM | Admit: 2016-03-23 | Discharge: 2016-03-23 | Disposition: A | Discharge status: Home / Self Care | Payer: Self-pay | Attending: Emergency Medicine | Admitting: Emergency Medicine

## 2016-03-23 ENCOUNTER — Encounter (HOSPITAL_COMMUNITY): Payer: Self-pay | Admitting: Emergency Medicine

## 2016-03-23 DIAGNOSIS — M545 Low back pain, unspecified: Secondary | ICD-10-CM

## 2016-03-23 DIAGNOSIS — Z7982 Long term (current) use of aspirin: Secondary | ICD-10-CM | POA: Insufficient documentation

## 2016-03-23 DIAGNOSIS — F17210 Nicotine dependence, cigarettes, uncomplicated: Secondary | ICD-10-CM | POA: Insufficient documentation

## 2016-03-23 DIAGNOSIS — Z79899 Other long term (current) drug therapy: Secondary | ICD-10-CM | POA: Insufficient documentation

## 2016-03-23 MED ORDER — METHOCARBAMOL 500 MG PO TABS: 500.0000 mg | ORAL_TABLET | Freq: Every evening | ORAL | 0 refills | Status: DC | PRN

## 2016-03-23 MED ORDER — NAPROXEN 500 MG PO TABS: 500.0000 mg | ORAL_TABLET | Freq: Two times a day (BID) | ORAL | 0 refills | Status: DC

## 2016-03-23 NOTE — ED Triage Notes (Signed)
Pt reports right lower back pain, was seen here last week and states all tests were negative. Pt reports pain has increased, denies fever or urinary symptoms.

## 2016-03-23 NOTE — ED Provider Notes (Signed)
[facility] DEPT Provider Note   CSN: [id number] Arrival date & time: 03/23/16  1633  By signing my name below, I, Dora Sims, attest that this documentation has been prepared under the direction and in the presence of Avie Echevaria, PA-C. Electronically Signed: Dora Sims, Scribe. 03/23/2016. 6:13 PM.  History   Chief Complaint Chief Complaint  Patient presents with  . Back Pain    The history is provided by the patient. No language interpreter was used.     HPI Comments: Catherine Cannon is a 36 y.o. female without chronic medical problems who presents to the Emergency Department complaining of persistent right lower back pain beginning on 1/18. She endorses pain radiation up her back on the right. Pt was seen here on 1/22 for the same; she had a CT renal stone study which was negative and she was administered pain medication and fluids with some improvement of her pain. Pt reports her pain has worsened since her discharge on 1/22. Pt had a few Robaxin pills at home leftover from an unrelated condition and has taken them with some improvement of her back/flank pain. She has also continued to take ibuprofen 400 mg every 6 hours with no improvement of her pain. Pt additionally reports her pain is worse with sitting upright and with lying flat, and better with heat therapy and leaning to her left. Pt was diagnosed with pneumonia in December 2017 and took her prescribed antibiotics with complete resolution of her symptoms. No regular medications. She denies fever, chills, abdominal pain, nausea, vomiting, dysuria, hematuria, or any other associated symptoms.  History reviewed. No pertinent past medical history.  There are no active problems to display for this patient.   History reviewed. No pertinent surgical history.  OB History    No data available       Home Medications    Prior to Admission medications   Medication Sig Start Date End Date Taking? Authorizing  Provider  acetaminophen (TYLENOL) 500 MG tablet Take 1,000 mg by mouth every 6 (six) hours as needed for moderate pain.     Historical Provider, MD  albuterol (PROVENTIL HFA;VENTOLIN HFA) 108 (90 Base) MCG/ACT inhaler Inhale 2 puffs into the lungs every 6 (six) hours as needed for wheezing or shortness of breath.    Historical Provider, MD  aspirin 325 MG tablet Take 325 mg by mouth every 6 (six) hours as needed for moderate pain.    Historical Provider, MD  guaifenesin (ROBITUSSIN) 100 MG/5ML syrup Take 200 mg by mouth 3 (three) times daily as needed for cough.    Historical Provider, MD  ibuprofen (ADVIL,MOTRIN) 200 MG tablet Take 400 mg by mouth every 6 (six) hours as needed.    Historical Provider, MD  methocarbamol (ROBAXIN) 500 MG tablet Take 1 tablet (500 mg total) by mouth at bedtime as needed for muscle spasms. 03/23/16   Emeline General, PA-C  naproxen (NAPROSYN) 500 MG tablet Take 1 tablet (500 mg total) by mouth 2 (two) times daily with a meal. 03/23/16   Emeline General, PA-C    Family History Family History  Problem Relation Age of Onset  . Asthma Mother   . Cancer Other     Social History Social History  Substance Use Topics  . Smoking status: Current Every Day Smoker    Packs/day: 1.00    Types: Cigarettes  . Smokeless tobacco: Not on file  . Alcohol use Yes     Comment: occ      Allergies  Patient has no known allergies.   Review of Systems Review of Systems  Constitutional: Negative for chills and fever.  HENT: Negative for ear pain and sore throat.   Eyes: Negative for pain and visual disturbance.  Respiratory: Negative for cough, chest tightness, shortness of breath and wheezing.   Cardiovascular: Negative for chest pain, palpitations and leg swelling.  Gastrointestinal: Negative for abdominal pain, nausea and vomiting.  Genitourinary: Positive for flank pain. Negative for dysuria and hematuria.  Musculoskeletal: Positive for back pain and myalgias.  Negative for arthralgias, gait problem, joint swelling, neck pain and neck stiffness.  Skin: Negative for color change, pallor and rash.  Neurological: Negative for seizures, syncope, weakness, light-headedness and numbness.  All other systems reviewed and are negative.    Physical Exam Updated Vital Signs BP 149/93 (BP Location: Right Arm)   Pulse 84   Temp 98 F (36.7 C)   Resp 16   Ht 5\' 9"  (1.753 m)   Wt 86.2 kg   LMP 03/12/2016   SpO2 97%   BMI 28.06 kg/m   Physical Exam  Constitutional: She is oriented to person, place, and time. She appears well-developed and well-nourished. No distress.  Afebrile, non-toxic appearing, sitting comfortably in chair in no acute distress.  HENT:  Head: Normocephalic and atraumatic.  Eyes: Conjunctivae and EOM are normal.  Neck: Normal range of motion. Neck supple. No tracheal deviation present.  Cardiovascular: Normal rate, regular rhythm, normal heart sounds and intact distal pulses.   Pulmonary/Chest: Effort normal. No respiratory distress.  Abdominal: Soft. She exhibits no distension. There is no tenderness. There is no rebound and no guarding.  Musculoskeletal: Normal range of motion. She exhibits tenderness. She exhibits no edema or deformity.  No midline tenderness of the spine. Tender to palpation of right lower latissimus  Neurological: She is alert and oriented to person, place, and time. No sensory deficit. She exhibits normal muscle tone. Coordination normal.  Skin: Skin is warm and dry. No rash noted. She is not diaphoretic. No erythema. No pallor.  Psychiatric: She has a normal mood and affect. Her behavior is normal.  Nursing note and vitals reviewed.    ED Treatments / Results  Labs (all labs ordered are listed, but only abnormal results are displayed) Labs Reviewed - No data to display  EKG  EKG Interpretation None       Radiology No results found.  Procedures Procedures (including critical care  time)  DIAGNOSTIC STUDIES: Oxygen Saturation is 97% on RA, normal by my interpretation.    COORDINATION OF CARE: 6:22 PM Discussed treatment plan with pt at bedside and pt agreed to plan.  Medications Ordered in ED Medications - No data to display   Initial Impression / Assessment and Plan / ED Course  I have reviewed the triage vital signs and the nursing notes.  Pertinent labs & imaging results that were available during my care of the patient were reviewed by me and considered in my medical decision making (see chart for details).    Otherwise healthy 36 year old female presenting with persistent right lower back pain. She had an extensive workup a week ago for renal stones which was negative. She reported some improvement with leftover Robaxin.  Reassuring exam. No midline tenderness palpation or tenderness. Tender over the right latissimus dorsi. Treated for pneumonia back in December with total resolution of symptoms.  Patient denies any other symptoms. Neurovascularly intact and normal gait. Discharge home with symptomatic relief and back exercises with close follow-up  with PCP.  Discussed strict return precautions. Patient was advised to return to the emergency department if experiencing any new or worsening symptoms. She understood instructions and agreed with discharge plan.  Final Clinical Impressions(s) / ED Diagnoses   Final diagnoses:  Right-sided low back pain without sciatica, unspecified chronicity    New Prescriptions New Prescriptions   METHOCARBAMOL (ROBAXIN) 500 MG TABLET    Take 1 tablet (500 mg total) by mouth at bedtime as needed for muscle spasms.   NAPROXEN (NAPROSYN) 500 MG TABLET    Take 1 tablet (500 mg total) by mouth 2 (two) times daily with a meal.   I personally performed the services described in this documentation, which was scribed in my presence. The recorded information has been reviewed and is accurate.    Emeline General,  PA-C 03/23/16 Freeman Spur, PA-C 03/23/16 1837    Virgel Manifold, MD 04/01/16 [phone]

## 2016-04-23 ENCOUNTER — Encounter (HOSPITAL_COMMUNITY): Payer: Self-pay

## 2016-04-23 ENCOUNTER — Emergency Department (HOSPITAL_COMMUNITY)
Admission: EM | Admit: 2016-04-23 | Discharge: 2016-04-23 | Disposition: A | Discharge status: Home / Self Care | Payer: Self-pay | Attending: Emergency Medicine | Admitting: Emergency Medicine

## 2016-04-23 DIAGNOSIS — Z79899 Other long term (current) drug therapy: Secondary | ICD-10-CM | POA: Insufficient documentation

## 2016-04-23 DIAGNOSIS — Z7982 Long term (current) use of aspirin: Secondary | ICD-10-CM | POA: Insufficient documentation

## 2016-04-23 DIAGNOSIS — K029 Dental caries, unspecified: Secondary | ICD-10-CM | POA: Insufficient documentation

## 2016-04-23 DIAGNOSIS — K0889 Other specified disorders of teeth and supporting structures: Secondary | ICD-10-CM | POA: Insufficient documentation

## 2016-04-23 DIAGNOSIS — F17210 Nicotine dependence, cigarettes, uncomplicated: Secondary | ICD-10-CM | POA: Insufficient documentation

## 2016-04-23 MED ORDER — PENICILLIN V POTASSIUM 500 MG PO TABS: 500.0000 mg | ORAL_TABLET | Freq: Three times a day (TID) | ORAL | 0 refills | Status: AC

## 2016-04-23 MED ORDER — HYDROCODONE-ACETAMINOPHEN 5-325 MG PO TABS
2.0000 | ORAL_TABLET | ORAL | 0 refills | Status: DC | PRN
Start: 2016-04-23 — End: 2017-01-05

## 2016-04-23 MED ORDER — IBUPROFEN 600 MG PO TABS: 600.0000 mg | ORAL_TABLET | Freq: Four times a day (QID) | ORAL | 0 refills | Status: DC | PRN

## 2016-04-23 NOTE — ED Triage Notes (Signed)
Pt reports broken tooth causing pain. Needs to have it pulled but does not have insurance. Broken tooth noted.

## 2016-04-23 NOTE — Discharge Instructions (Signed)
Take medications as prescribed. You may also take 600 mg ibuprofen 4 times daily as needed for pain relief. I recommend eating prior to taking ibuprofen to prevent gastrointestinal side effects. You may apply ice to affected area for 15-20 minutes 3-4 times daily to help with pain. °Follow-up with one of the dental clinics listed below for further management of your dental pain. °Return to the emergency department if symptoms worsen or new onset of fever, headache, neck stiffness, facial/neck swelling, unable to open jaw, unable to swallow resulting in drooling, difficulty breathing, drainage, unable to tolerate fluids.  ° °[employer] °[employer] °[facility] °[address] °[place], [place] [address] °Phone [phone] ° °[facility] [employer] [facility] in [place], [place], exemplifies the Dental School?s vision to improve the health and quality of life of all [place]ians by creating leaders with a passion to care for the underserved and by leading the nation in community-based, service learning oral health education. ° °We are committed to offering comprehensive general dental services for adults, children and special needs patients in a safe, caring and professional setting. ° ° °Appointments: Our clinic is open Monday through Friday 8:00 a.m. until 5:00 p.m. The amount of time scheduled for an appointment depends on the patient?s specific needs. We ask that you keep your appointed time for care or provide 24-hour notice of all appointment changes. Parents or legal guardians must accompany minor children. °  °Payment for Services: Medicaid and other insurance plans are welcome. Payment for services is due when services are rendered and may be made by cash or credit card. If you have dental insurance, we will assist you with your claim submission. °   °Emergencies:   Emergency services will be provided Monday through Friday on a walk-in basis.  Please arrive early for emergency services. After hours emergency services will be provided for patients of record as required. °  °Services:  °Comprehensive General Dentistry °Children?s Dentistry °Oral Surgery - Extractions °Root Canals °Sealants and Tooth Colored Fillings °Crowns and Bridges °Dentures and Partial Dentures °Implant Services °Periodontal Services and Cleanings °Cosmetic Tooth Whitening °Digital Radiography °3-D/[facility]  °

## 2016-04-23 NOTE — ED Provider Notes (Signed)
[facility] DEPT Provider Note   CSN: [id number] Arrival date & time: 04/23/16  1650   By signing my name below, I, Soijett Blue, attest that this documentation has been prepared under the direction and in the presence of Harlene Ramus, PA-C Electronically Signed: Soijett Blue, ED Scribe. 04/23/16. 7:09 PM.  History   Chief Complaint Chief Complaint  Patient presents with  . Dental Pain    HPI Catherine Cannon is a 36 y.o. female who presents to the Emergency Department complaining of right lower dental pain onset 3 weeks ago. Pt reports associated right sided facial swelling, right jaw pain, and right ear pain. Pt has tried ibuprofen, naproxen, and OTC oral anesthetic with no relief of her symptoms. She notes that one of her right lower teeth is broken and she is unaware of when it broke. She states that her right lower dental pain is worsened with temperature change. Pt denies having a dentist at this time. She denies fever, chills, drainage, SOB, trouble swallowing, vomiting, and any other symptoms. Denies PMHx.   The history is provided by the patient. No language interpreter was used.    History reviewed. No pertinent past medical history.  There are no active problems to display for this patient.   History reviewed. No pertinent surgical history.  OB History    No data available       Home Medications    Prior to Admission medications   Medication Sig Start Date End Date Taking? Authorizing Provider  acetaminophen (TYLENOL) 500 MG tablet Take 1,000 mg by mouth every 6 (six) hours as needed for moderate pain.     Historical Provider, MD  albuterol (PROVENTIL HFA;VENTOLIN HFA) 108 (90 Base) MCG/ACT inhaler Inhale 2 puffs into the lungs every 6 (six) hours as needed for wheezing or shortness of breath.    Historical Provider, MD  aspirin 325 MG tablet Take 325 mg by mouth every 6 (six) hours as needed for moderate pain.    Historical Provider, MD  guaifenesin  (ROBITUSSIN) 100 MG/5ML syrup Take 200 mg by mouth 3 (three) times daily as needed for cough.    Historical Provider, MD  ibuprofen (ADVIL,MOTRIN) 200 MG tablet Take 400 mg by mouth every 6 (six) hours as needed.    Historical Provider, MD  methocarbamol (ROBAXIN) 500 MG tablet Take 1 tablet (500 mg total) by mouth at bedtime as needed for muscle spasms. 03/23/16   Emeline General, PA-C  naproxen (NAPROSYN) 500 MG tablet Take 1 tablet (500 mg total) by mouth 2 (two) times daily with a meal. 03/23/16   Emeline General, PA-C    Family History Family History  Problem Relation Age of Onset  . Asthma Mother   . Cancer Other     Social History Social History  Substance Use Topics  . Smoking status: Current Every Day Smoker    Packs/day: 1.00    Types: Cigarettes  . Smokeless tobacco: Never Used  . Alcohol use Yes     Comment: occ      Allergies   Patient has no known allergies.   Review of Systems Review of Systems  Constitutional: Negative for fever.  HENT: Positive for dental problem (right lower) and ear pain (right). Negative for trouble swallowing.        +Right sided facial swelling and right jaw pain. No drainage from the affected area.  Respiratory: Negative for shortness of breath.   Gastrointestinal: Negative for vomiting.     Physical Exam Updated  Vital Signs BP 156/88 (BP Location: Left Arm)   Pulse 92   Temp 98.2 F (36.8 C) (Oral)   Resp 16   LMP 04/15/2016 (Within Days)   SpO2 96%   Physical Exam  Constitutional: She is oriented to person, place, and time. She appears well-developed and well-nourished.  HENT:  Head: Normocephalic and atraumatic.  Mouth/Throat: Uvula is midline, oropharynx is clear and moist and mucous membranes are normal. No oral lesions. No trismus in the jaw. Abnormal dentition. Dental caries present. No dental abscesses or uvula swelling. No oropharyngeal exudate, posterior oropharyngeal edema, posterior oropharyngeal erythema or  tonsillar abscesses. No tonsillar exudate.    No facial or neck swelling. Floor of mouth soft. No trismus or drooling. Pt tolerating secretions.   Eyes: Conjunctivae and EOM are normal. Right eye exhibits no discharge. Left eye exhibits no discharge. No scleral icterus.  Neck: Normal range of motion. Neck supple.  Cardiovascular: Normal rate, regular rhythm, normal heart sounds and intact distal pulses.   Pulmonary/Chest: Effort normal and breath sounds normal. No respiratory distress. She has no wheezes. She has no rales. She exhibits no tenderness.  Abdominal: Soft. She exhibits no distension.  Musculoskeletal: She exhibits no edema.  Lymphadenopathy:    She has no cervical adenopathy.  Neurological: She is alert and oriented to person, place, and time.  Skin: Skin is warm and dry.  Nursing note and vitals reviewed.    ED Treatments / Results  DIAGNOSTIC STUDIES: Oxygen Saturation is 96% on RA, nl by my interpretation.    COORDINATION OF CARE: 7:09 PM Discussed treatment plan with pt at bedside which includes referral and follow up with dentist, penicillin Rx, pain meds and pt agreed to plan.   Procedures Procedures (including critical care time)  Medications Ordered in ED Medications - No data to display   Initial Impression / Assessment and Plan / ED Course  I have reviewed the triage vital signs and the nursing notes.     Patient with dentalgia.  No abscess requiring immediate incision and drainage.  Exam not concerning for Ludwig's angina or pharyngeal abscess. Will treat with ibuprofen, penicillin Rx, and norco Rx. Pt instructed to follow-up with dentist.  Discussed return precautions. Patient given outpatient dental resources.  Final Clinical Impressions(s) / ED Diagnoses   Final diagnoses:  None    New Prescriptions New Prescriptions   No medications on file   I personally performed the services described in this documentation, which was scribed in my  presence. The recorded information has been reviewed and is accurate.    Chesley Noon Hubbell, [place] 04/23/16 1913    Carmin Muskrat, MD 04/23/16 2036

## 2017-01-05 ENCOUNTER — Encounter (HOSPITAL_COMMUNITY): Payer: Self-pay | Admitting: *Deleted

## 2017-01-05 ENCOUNTER — Emergency Department (HOSPITAL_COMMUNITY)
Admission: EM | Admit: 2017-01-05 | Discharge: 2017-01-05 | Disposition: A | Discharge status: Home / Self Care | Payer: Self-pay | Attending: Emergency Medicine | Admitting: Emergency Medicine

## 2017-01-05 ENCOUNTER — Other Ambulatory Visit: Payer: Self-pay

## 2017-01-05 DIAGNOSIS — F17210 Nicotine dependence, cigarettes, uncomplicated: Secondary | ICD-10-CM | POA: Insufficient documentation

## 2017-01-05 DIAGNOSIS — K0889 Other specified disorders of teeth and supporting structures: Secondary | ICD-10-CM | POA: Insufficient documentation

## 2017-01-05 MED ORDER — AMOXICILLIN 500 MG PO CAPS: 500.0000 mg | ORAL_CAPSULE | Freq: Three times a day (TID) | ORAL | 0 refills | Status: DC

## 2017-01-05 NOTE — Discharge Instructions (Addendum)
Call Dr. Bethanie Dicker office in the next 24 hours to schedule an appointment for re-evaluation and further management. If you are unable to do so I have provided you with a list of dentists you are able to call for appointment.   Please take all of your antibiotics until finished. You may develop abdominal discomfort or diarrhea from the antibiotic.  You may help offset this with probiotics which you can buy at the store (ask your pharmacist if unable to find) or get probiotics in the form of eating yogurt. Do not eat or take the probiotics until 2 hours after your antibiotic. If you are unable to tolerate these side effects follow-up with your primary care provider or return to the emergency department.   If you begin to experience any blistering, rashes, swelling, or difficulty breathing seek medical care for evaluation of potentially more serious side effects.   Please be aware that this medication may interact with other medications you are taking, please be sure to discuss your medication list with your pharmacist. If you are taking birth control the antibiotic will deactivate your birth control for 2 weeks. If on coumadin the antibiotic will effect your coumadin level.   If you start to experience and new or worsening symptoms return to the emergency department. If you start to experience fever, chills, neck stiffness/pain, or inability to move your neck come back to the emergency department immediately.

## 2017-01-05 NOTE — ED Provider Notes (Signed)
[facility] EMERGENCY DEPARTMENT Provider Note   CSN: [id number] Arrival date & time: 01/05/17  1318     History   Chief Complaint Chief Complaint  Patient presents with  . Dental Pain    HPI Catherine Cannon is a 36 y.o. female presents with complaint of R lower dental pain x 3 days. Patient states that she has had intermittent problems with this tooth for the past year since chipping it, previously requiring abx. Started to have pain 3 days prior that has been gradually worsening. The pain radiates to entire R jaw and is an aching/throbbing pain. She has tried ibuprofen without relief. Denies neck pain/stiffness, fever, chills, nausea, or vomiting.   HPI  History reviewed. No pertinent past medical history.  There are no active problems to display for this patient.   History reviewed. No pertinent surgical history.  OB History    No data available       Home Medications    Ibuprofen 600 mg every 6 hours as needed  Family History Family History  Problem Relation Age of Onset  . Asthma Mother   . Cancer Other     Social History Social History   Tobacco Use  . Smoking status: Current Every Day Smoker    Packs/day: 1.00    Types: Cigarettes  . Smokeless tobacco: Never Used  Substance Use Topics  . Alcohol use: Yes    Comment: occ   . Drug use: Yes    Types: Cocaine     Allergies   Patient has no known allergies.   Review of Systems Review of Systems  Constitutional: Negative for activity change, chills and fever.  HENT: Positive for dental problem. Negative for drooling, ear pain, rhinorrhea, sore throat and trouble swallowing.   Respiratory: Negative for shortness of breath.   Cardiovascular: Negative for chest pain.  Gastrointestinal: Negative for abdominal pain, nausea and vomiting.  Musculoskeletal: Negative for neck pain and neck stiffness.     Physical Exam Updated Vital Signs BP (!) 148/91 (BP Location: Left Arm)    Pulse 64   Temp 98.1 F (36.7 C) (Oral)   Resp 16   Ht 5\' 8"  (1.727 m)   SpO2 100%   BMI 28.89 kg/m   Physical Exam  Constitutional: She appears well-developed and well-nourished. No distress.  HENT:  Head: Normocephalic and atraumatic.  Right Ear: Tympanic membrane normal. Tympanic membrane is not erythematous and not retracted.  Left Ear: Tympanic membrane normal. Tympanic membrane is not erythematous and not retracted.  Nose: Nose normal.  Mouth/Throat: Uvula is midline and oropharynx is clear and moist. No oropharyngeal exudate.    Neck: Normal range of motion and full passive range of motion without pain. Neck supple. No spinous process tenderness and no muscular tenderness present.  Cardiovascular: Normal rate and regular rhythm.  Abdominal: Soft. She exhibits no distension. There is no tenderness.  Neurological: She is alert.  Clear speech.   Skin: Skin is warm and dry. No rash noted.  Psychiatric: She has a normal mood and affect. Her behavior is normal. Thought content normal.  Nursing note and vitals reviewed.    ED Treatments / Results    Initial Impression / Assessment and Plan / ED Course  I have reviewed the triage vital signs and the nursing notes.  Pertinent labs & imaging results that were available during my care of the patient were reviewed by me and considered in my medical decision making (see chart for details).  Patient presents with toothache, non toxic appearing with stable vital signs.  No gross abscess appreciated.  Exam unconcerning for Ludwig's angina or spread of infection.  Will treat with Amoxicillin and anti-inflammatory medicine.  Urged patient to follow-up with dentist- resources provided. Discussed return precautions, patient confirmed understanding.   Final Clinical Impressions(s) / ED Diagnoses   Final diagnoses:  Pain, dental    ED Discharge Orders        Ordered    amoxicillin (AMOXIL) 500 MG capsule  3 times daily      01/05/17 [address] [place] R, PA-C 01/05/17 1437    Isla Pence, MD 01/05/17 1502

## 2017-01-05 NOTE — ED Notes (Signed)
See EDP secondary assessment.  

## 2017-01-05 NOTE — ED Triage Notes (Signed)
Pt in c/o right lower toothache for two days, denies fever, no distress noted

## 2017-03-12 ENCOUNTER — Emergency Department (HOSPITAL_COMMUNITY): Admission: EM | Admit: 2017-03-12 | Discharge: 2017-03-12 | Discharge status: Against Medical Advice | Payer: Self-pay

## 2017-03-12 ENCOUNTER — Other Ambulatory Visit: Payer: Self-pay

## 2017-03-12 NOTE — ED Triage Notes (Signed)
Cold cough for one week  Productive cough duting triage the pt decided to leave she does not want to wait

## 2017-09-24 ENCOUNTER — Other Ambulatory Visit: Payer: Self-pay

## 2017-09-24 ENCOUNTER — Encounter (HOSPITAL_COMMUNITY): Payer: Self-pay | Admitting: *Deleted

## 2017-09-24 ENCOUNTER — Emergency Department (HOSPITAL_COMMUNITY)
Admission: EM | Admit: 2017-09-24 | Discharge: 2017-09-24 | Disposition: A | Discharge status: Home / Self Care | Payer: Self-pay | Attending: Emergency Medicine | Admitting: Emergency Medicine

## 2017-09-24 ENCOUNTER — Emergency Department (HOSPITAL_COMMUNITY): Payer: Self-pay

## 2017-09-24 DIAGNOSIS — R05 Cough: Secondary | ICD-10-CM

## 2017-09-24 DIAGNOSIS — J204 Acute bronchitis due to parainfluenza virus: Secondary | ICD-10-CM | POA: Insufficient documentation

## 2017-09-24 DIAGNOSIS — R059 Cough, unspecified: Secondary | ICD-10-CM

## 2017-09-24 DIAGNOSIS — F17210 Nicotine dependence, cigarettes, uncomplicated: Secondary | ICD-10-CM | POA: Insufficient documentation

## 2017-09-24 LAB — COMPREHENSIVE METABOLIC PANEL
ALBUMIN: 3.3 g/dL — AB (ref 3.5–5.0)
ALT: 34 U/L (ref 0–44)
AST: 37 U/L (ref 15–41)
Alkaline Phosphatase: 63 U/L (ref 38–126)
Anion gap: 9 (ref 5–15)
BILIRUBIN TOTAL: 0.4 mg/dL (ref 0.3–1.2)
BUN: <5 mg/dL — ABNORMAL LOW (ref 6–20)
CALCIUM: 9.4 mg/dL (ref 8.9–10.3)
CO2: 24 mmol/L (ref 22–32)
CREATININE: 0.52 mg/dL (ref 0.44–1.00)
Chloride: 103 mmol/L (ref 98–111)
GFR calc Af Amer: >60 mL/min (ref >60)
GFR calc non Af Amer: >60 mL/min (ref >60)
GLUCOSE: 94 mg/dL (ref 70–99)
Potassium: 4 mmol/L (ref 3.5–5.1)
SODIUM: 136 mmol/L (ref 135–145)
TOTAL PROTEIN: 6.7 g/dL (ref 6.5–8.1)

## 2017-09-24 LAB — CBC WITH DIFFERENTIAL/PLATELET
ABS IMMATURE GRANULOCYTES: 0.2 10*3/uL — AB (ref 0.0–0.1)
BASOS ABS: 0.1 10*3/uL (ref 0.0–0.1)
Basophils Relative: 0 %
EOS PCT: 2 %
Eosinophils Absolute: 0.4 10*3/uL (ref 0.0–0.7)
HEMATOCRIT: 42.9 % (ref 36.0–46.0)
HEMOGLOBIN: 14.4 g/dL (ref 12.0–15.0)
Immature Granulocytes: 1 %
LYMPHS ABS: 2.7 10*3/uL (ref 0.7–4.0)
LYMPHS PCT: 16 %
MCH: 32.7 pg (ref 26.0–34.0)
MCHC: 33.6 g/dL (ref 30.0–36.0)
MCV: 97.5 fL (ref 78.0–100.0)
MONO ABS: 1 10*3/uL (ref 0.1–1.0)
Monocytes Relative: 6 %
NEUTROS ABS: 12.3 10*3/uL — AB (ref 1.7–7.7)
Neutrophils Relative %: 75 %
Platelets: 234 10*3/uL (ref 150–400)
RBC: 4.4 MIL/uL (ref 3.87–5.11)
RDW: 12.6 % (ref 11.5–15.5)
WBC: 16.6 10*3/uL — ABNORMAL HIGH (ref 4.0–10.5)

## 2017-09-24 MED ORDER — BENZONATATE 100 MG PO CAPS: 100.0000 mg | ORAL_CAPSULE | Freq: Three times a day (TID) | ORAL | 0 refills | Status: DC

## 2017-09-24 MED ORDER — PREDNISONE 10 MG PO TABS: 20.0000 mg | ORAL_TABLET | Freq: Every day | ORAL | 0 refills | Status: AC

## 2017-09-24 MED ORDER — PREDNISONE 10 MG PO TABS: 20.0000 mg | ORAL_TABLET | Freq: Every day | ORAL | 0 refills | Status: DC

## 2017-09-24 MED ORDER — ALBUTEROL SULFATE HFA 108 (90 BASE) MCG/ACT IN AERS: 1.0000 | INHALATION_SPRAY | Freq: Four times a day (QID) | RESPIRATORY_TRACT | 0 refills | Status: DC | PRN

## 2017-09-24 MED ORDER — IPRATROPIUM-ALBUTEROL 0.5-2.5 (3) MG/3ML IN SOLN
3.0000 mL | Freq: Once | RESPIRATORY_TRACT | Status: AC
  Administered 2017-09-24: 3 mL via RESPIRATORY_TRACT
  Filled 2017-09-24: qty 3

## 2017-09-24 MED ORDER — BENZONATATE 100 MG PO CAPS: 100.0000 mg | ORAL_CAPSULE | Freq: Three times a day (TID) | ORAL | 0 refills | Status: AC

## 2017-09-24 NOTE — ED Triage Notes (Signed)
Pt in c/o cough and congestion, chest pain when taking a deep breath for the last week, hx of bronchitis and pneumonia and states it is starting to feel that way, reports body aches and productive cough

## 2017-09-24 NOTE — ED Notes (Signed)
ED Provider at bedside. 

## 2017-09-24 NOTE — ED Provider Notes (Signed)
[facility] EMERGENCY DEPARTMENT Provider Note   CSN: [id number] Arrival date & time: 09/24/17  1327     History   Chief Complaint Chief Complaint  Patient presents with  . Cough    HPI Catherine Cannon is a 37 y.o. female.  37 y/o female smoker presents to the ED with a chief complaint of cough x 1 week. Patient reports the symptoms began with head congestion then coughing followed. Patient reports a dry cough at first but now the cough has turned productive.She reports the cough as yellow/green.She has tried Mucinex OTC yesterday but states no relieve in symptoms.Patient reports the chest pain as pressure on her chest. She denies fever, abdominal pain, shortness of breath or other complaints.      History reviewed. No pertinent past medical history.  There are no active problems to display for this patient.   History reviewed. No pertinent surgical history.   OB History   None      Home Medications    Prior to Admission medications   Medication Sig Start Date End Date Taking? Authorizing Provider  albuterol (PROVENTIL HFA;VENTOLIN HFA) 108 (90 Base) MCG/ACT inhaler Inhale 1-2 puffs into the lungs every 6 (six) hours as needed for wheezing or shortness of breath. 09/24/17   Janeece Fitting, PA-C  amoxicillin (AMOXIL) 500 MG capsule Take 1 capsule (500 mg total) 3 (three) times daily by mouth. 01/05/17   Petrucelli, Samantha R, PA-C  benzonatate (TESSALON) 100 MG capsule Take 1 capsule (100 mg total) by mouth 3 (three) times daily for 7 days. 09/24/17 10/01/17  Janeece Fitting, PA-C  ibuprofen (ADVIL,MOTRIN) 600 MG tablet Take 1 tablet (600 mg total) by mouth every 6 (six) hours as needed. 04/23/16   Nona Dell, PA-C  predniSONE (DELTASONE) 10 MG tablet Take 2 tablets (20 mg total) by mouth daily for 5 days. 09/24/17 09/29/17  Janeece Fitting, PA-C    Family History Family History  Problem Relation Age of Onset  . Asthma Mother   . Cancer Other      Social History Social History   Tobacco Use  . Smoking status: Current Every Day Smoker    Packs/day: 1.00    Types: Cigarettes  . Smokeless tobacco: Never Used  Substance Use Topics  . Alcohol use: Yes    Comment: occ   . Drug use: Yes    Types: Cocaine     Allergies   Patient has no known allergies.   Review of Systems Review of Systems  Constitutional: Negative for chills and fever.  HENT: Positive for postnasal drip and sore throat. Negative for ear pain.   Eyes: Negative for pain and visual disturbance.  Respiratory: Positive for cough. Negative for shortness of breath.   Cardiovascular: Positive for chest pain. Negative for palpitations.  Gastrointestinal: Negative for abdominal pain, diarrhea, nausea and vomiting.  Genitourinary: Negative for dysuria and hematuria.  Musculoskeletal: Negative for arthralgias and back pain.  Skin: Negative for color change and rash.  Neurological: Negative for seizures and syncope.  All other systems reviewed and are negative.    Physical Exam Updated Vital Signs BP 135/78   Pulse 81   Temp 98 F (36.7 C) (Oral)   Resp 18   SpO2 95%   Physical Exam  Constitutional: She is oriented to person, place, and time. She appears well-developed and well-nourished. No distress.  HENT:  Head: Normocephalic and atraumatic.  Mouth/Throat: Oropharynx is clear and moist. No oropharyngeal exudate.  Eyes: Pupils are  equal, round, and reactive to light.  Neck: Normal range of motion.  Cardiovascular: Regular rhythm and normal heart sounds.  Pulmonary/Chest: Effort normal. No respiratory distress. She has decreased breath sounds. She has rhonchi in the right upper field and the left upper field.  Abdominal: Soft. Bowel sounds are normal. She exhibits no distension. There is no tenderness.  Musculoskeletal: She exhibits no tenderness or deformity.       Right lower leg: She exhibits no edema.       Left lower leg: She exhibits no edema.   Neurological: She is alert and oriented to person, place, and time.  Skin: Skin is warm and dry. Capillary refill takes less than 2 seconds.  Psychiatric: She has a normal mood and affect.  Nursing note and vitals reviewed.    ED Treatments / Results  Labs (all labs ordered are listed, but only abnormal results are displayed) Labs Reviewed  CBC WITH DIFFERENTIAL/PLATELET - Abnormal; Notable for the following components:      Result Value   WBC 16.6 (*)    Neutro Abs 12.3 (*)    Abs Immature Granulocytes 0.2 (*)    All other components within normal limits  COMPREHENSIVE METABOLIC PANEL - Abnormal; Notable for the following components:   BUN <5 (*)    Albumin 3.3 (*)    All other components within normal limits    EKG None  Radiology Dg Chest 2 View  Result Date: 09/24/2017 CLINICAL DATA:  Acute chest pain and shortness of breath for 1 week. EXAM: CHEST - 2 VIEW COMPARISON:  03/16/2016 and prior chest radiographs FINDINGS: The cardiomediastinal silhouette is unremarkable. Chronic peribronchial thickening again noted. There is no evidence of focal airspace disease, pulmonary edema, suspicious pulmonary nodule/mass, pleural effusion, or pneumothorax. No acute bony abnormalities are identified. IMPRESSION: No evidence of acute cardiopulmonary disease Chronic peribronchial thickening. Electronically Signed   By: Margarette Canada M.D.   On: 09/24/2017 14:23    Procedures Procedures (including critical care time)  Medications Ordered in ED Medications  ipratropium-albuterol (DUONEB) 0.5-2.5 (3) MG/3ML nebulizer solution 3 mL (3 mLs Nebulization Given 09/24/17 1918)     Initial Impression / Assessment and Plan / ED Course  I have reviewed the triage vital signs and the nursing notes.  Pertinent labs & imaging results that were available during my care of the patient were reviewed by me and considered in my medical decision making (see chart for details).     CMP showed lo  electrolyte abnormality at this time.CBC showed leukocytosis 16.6. Patient presents with this cough over 1 week. She is a current smoker (1 pack a day). Upon examination, mild wheezing heard throughout the upper right and lower lung fields. She has tried Mucinex with no relieve in symptoms.I have given patient breathing treatment while in the ED, her symptoms have seem to improve. She also asked for a referral to a PCP, I have provided the [facility] #. I believe patient is likely suffering from bronchitis, she is a current smoker (1 pack a day). Chest Xray showed no evidence of acute cardiopulmonary disease.Chronic peribronchial thickening. At this time patient is afebrile, her vitals have been stable throughout her visit.I will send her home with antibiotics, steroids and an inhaler. Return precautions provided.   Final Clinical Impressions(s) / ED Diagnoses   Final diagnoses:  Cough  Acute bronchitis due to parainfluenza virus    ED Discharge Orders        Ordered  benzonatate (TESSALON) 100 MG capsule  3 times daily,   Status:  Discontinued     09/24/17 1935    predniSONE (DELTASONE) 10 MG tablet  Daily,   Status:  Discontinued     09/24/17 1948    albuterol (PROVENTIL HFA;VENTOLIN HFA) 108 (90 Base) MCG/ACT inhaler  Every 6 hours PRN,   Status:  Discontinued     09/24/17 1950    albuterol (PROVENTIL HFA;VENTOLIN HFA) 108 (90 Base) MCG/ACT inhaler  Every 6 hours PRN     09/24/17 1952    benzonatate (TESSALON) 100 MG capsule  3 times daily     09/24/17 1952    predniSONE (DELTASONE) 10 MG tablet  Daily,   Status:  Discontinued     09/24/17 1952    predniSONE (DELTASONE) 10 MG tablet  Daily     09/24/17 1956       Janeece Fitting, Hershal Coria 09/24/17 Hayden Pedro, MD 09/25/17 [phone]

## 2017-09-24 NOTE — ED Notes (Signed)
Pt verbalized understanding of d.c instructions and follow up care, no further questions, pt a.o, ambulatory upon d.c 

## 2017-09-24 NOTE — Discharge Instructions (Addendum)
I have provided a referral to [facility] please schedule an appointment to establish care.  I have provided some steroids please take as directed also prescribe some Tessalon Perles please take for the cough.  Please use an inhaler 1 to 2 puffs every 6 hours as needed for shortness of breath.  These return to the ED if your symptoms worsen you experience chest pain, shortness of breath, fever.

## 2017-10-12 ENCOUNTER — Encounter (HOSPITAL_COMMUNITY): Payer: Self-pay | Admitting: Emergency Medicine

## 2017-10-12 ENCOUNTER — Emergency Department (HOSPITAL_COMMUNITY)
Admission: EM | Admit: 2017-10-12 | Discharge: 2017-10-12 | Disposition: A | Discharge status: Home / Self Care | Payer: Medicaid Other | Attending: Emergency Medicine | Admitting: Emergency Medicine

## 2017-10-12 ENCOUNTER — Other Ambulatory Visit: Payer: Self-pay

## 2017-10-12 ENCOUNTER — Emergency Department (HOSPITAL_COMMUNITY): Payer: Medicaid Other

## 2017-10-12 DIAGNOSIS — F17210 Nicotine dependence, cigarettes, uncomplicated: Secondary | ICD-10-CM | POA: Diagnosis not present

## 2017-10-12 DIAGNOSIS — Z3A01 Less than 8 weeks gestation of pregnancy: Secondary | ICD-10-CM | POA: Insufficient documentation

## 2017-10-12 DIAGNOSIS — Z79899 Other long term (current) drug therapy: Secondary | ICD-10-CM | POA: Insufficient documentation

## 2017-10-12 DIAGNOSIS — O99331 Smoking (tobacco) complicating pregnancy, first trimester: Secondary | ICD-10-CM | POA: Insufficient documentation

## 2017-10-12 DIAGNOSIS — O9952 Diseases of the respiratory system complicating childbirth: Secondary | ICD-10-CM | POA: Insufficient documentation

## 2017-10-12 DIAGNOSIS — R079 Chest pain, unspecified: Secondary | ICD-10-CM | POA: Diagnosis present

## 2017-10-12 DIAGNOSIS — J209 Acute bronchitis, unspecified: Secondary | ICD-10-CM

## 2017-10-12 LAB — BASIC METABOLIC PANEL
ANION GAP: 10 (ref 5–15)
BUN: <5 mg/dL — ABNORMAL LOW (ref 6–20)
CALCIUM: 9.1 mg/dL (ref 8.9–10.3)
CO2: 22 mmol/L (ref 22–32)
Chloride: 102 mmol/L (ref 98–111)
Creatinine, Ser: 0.55 mg/dL (ref 0.44–1.00)
GFR calc non Af Amer: >60 mL/min (ref >60)
Glucose, Bld: 105 mg/dL — ABNORMAL HIGH (ref 70–99)
Potassium: 3.7 mmol/L (ref 3.5–5.1)
Sodium: 134 mmol/L — ABNORMAL LOW (ref 135–145)

## 2017-10-12 LAB — CBC
HCT: 38.8 % (ref 36.0–46.0)
HEMOGLOBIN: 13.3 g/dL (ref 12.0–15.0)
MCH: 32.6 pg (ref 26.0–34.0)
MCHC: 34.3 g/dL (ref 30.0–36.0)
MCV: 95.1 fL (ref 78.0–100.0)
Platelets: 250 10*3/uL (ref 150–400)
RBC: 4.08 MIL/uL (ref 3.87–5.11)
RDW: 12.5 % (ref 11.5–15.5)
WBC: 15.3 10*3/uL — ABNORMAL HIGH (ref 4.0–10.5)

## 2017-10-12 LAB — I-STAT TROPONIN, ED: TROPONIN I, POC: 0 ng/mL (ref 0.00–0.08)

## 2017-10-12 LAB — I-STAT BETA HCG BLOOD, ED (MC, WL, AP ONLY): I-stat hCG, quantitative: >2000 m[IU]/mL — ABNORMAL HIGH (ref <5)

## 2017-10-12 MED ORDER — AZITHROMYCIN 250 MG PO TABS: 250.0000 mg | ORAL_TABLET | Freq: Every day | ORAL | 0 refills | Status: DC

## 2017-10-12 MED ORDER — ALBUTEROL SULFATE HFA 108 (90 BASE) MCG/ACT IN AERS: 1.0000 | INHALATION_SPRAY | Freq: Four times a day (QID) | RESPIRATORY_TRACT | 0 refills | Status: DC | PRN

## 2017-10-12 NOTE — ED Provider Notes (Signed)
[place] EMERGENCY DEPARTMENT Provider Note   CSN: [id number] Arrival date & time: 10/12/17  0745     History   Chief Complaint Chief Complaint  Patient presents with  . Chest Pain    HPI Catherine Cannon is a 37 y.o. female.  The history is provided by the patient. No language interpreter was used.  Chest Pain   This is a new problem. The current episode started more than 1 week ago. The problem occurs constantly. The problem has not changed since onset.The pain is associated with coughing. The pain is moderate. The pain does not radiate. The symptoms are aggravated by certain positions. She has tried nothing for the symptoms. The treatment provided moderate relief. There are no known risk factors.  Pt reports she was recently treated for bronchitis with prednisone.  Pt reports she continues to cough. Pt last period 2 months ago  History reviewed. No pertinent past medical history.  There are no active problems to display for this patient.   History reviewed. No pertinent surgical history.   OB History    Gravida  1   Para      Term      Preterm      AB      Living        SAB      TAB      Ectopic      Multiple      Live Births               Home Medications    Prior to Admission medications   Medication Sig Start Date End Date Taking? Authorizing Provider  albuterol (PROVENTIL HFA;VENTOLIN HFA) 108 (90 Base) MCG/ACT inhaler Inhale 1-2 puffs into the lungs every 6 (six) hours as needed for wheezing or shortness of breath. 09/24/17   Janeece Fitting, PA-C  amoxicillin (AMOXIL) 500 MG capsule Take 1 capsule (500 mg total) 3 (three) times daily by mouth. 01/05/17   Petrucelli, Samantha R, PA-C  azithromycin (ZITHROMAX) 250 MG tablet Take 1 tablet (250 mg total) by mouth daily. Take first 2 tablets together, then 1 every day until finished. 10/12/17   Fransico Meadow, PA-C  ibuprofen (ADVIL,MOTRIN) 600 MG tablet Take 1 tablet (600 mg  total) by mouth every 6 (six) hours as needed. 04/23/16   Nona Dell, PA-C    Family History Family History  Problem Relation Age of Onset  . Asthma Mother   . Cancer Other     Social History Social History   Tobacco Use  . Smoking status: Current Every Day Smoker    Packs/day: 1.00    Types: Cigarettes  . Smokeless tobacco: Never Used  Substance Use Topics  . Alcohol use: Yes    Comment: occ   . Drug use: Yes    Types: Cocaine     Allergies   Patient has no known allergies.   Review of Systems Review of Systems  Cardiovascular: Positive for chest pain.  All other systems reviewed and are negative.    Physical Exam Updated Vital Signs BP 125/76   Pulse 80   Temp 98.1 F (36.7 C) (Oral)   Resp 17   Ht 5\' 9"  (1.753 m)   Wt 86.2 kg   LMP 07/14/2017 (Exact Date) Comment: irregular  SpO2 99%   BMI 28.06 kg/m   Physical Exam  Constitutional: She is oriented to person, place, and time. She appears well-developed and well-nourished.  HENT:  Head:  Normocephalic.  Eyes: EOM are normal.  Neck: Normal range of motion.  Cardiovascular: Normal rate and regular rhythm.  Pulmonary/Chest: Effort normal and breath sounds normal.  Abdominal: She exhibits no distension.  Musculoskeletal: Normal range of motion.       Right lower leg: Normal.       Left lower leg: Normal.  Neurological: She is alert and oriented to person, place, and time.  Skin: Skin is warm.  Psychiatric: She has a normal mood and affect.  Nursing note and vitals reviewed.    ED Treatments / Results  Labs (all labs ordered are listed, but only abnormal results are displayed) Labs Reviewed  BASIC METABOLIC PANEL - Abnormal; Notable for the following components:      Result Value   Sodium 134 (*)    Glucose, Bld 105 (*)    BUN <5 (*)    All other components within normal limits  CBC - Abnormal; Notable for the following components:   WBC 15.3 (*)    All other components within  normal limits  I-STAT BETA HCG BLOOD, ED ([facility], [facility], [facility] ONLY) - Abnormal; Notable for the following components:   I-stat hCG, quantitative >2,000.0 (*)    All other components within normal limits  I-STAT TROPONIN, ED    EKG EKG Interpretation  Date/Time:  Tuesday October 12 2017 07:57:25 EDT Ventricular Rate:  80 PR Interval:  130 QRS Duration: 82 QT Interval:  376 QTC Calculation: 433 R Axis:   62 Text Interpretation:  Normal sinus rhythm no acute ST/T changes no significant change since Sep 24 2017 Confirmed by Sherwood Gambler [phone]) on 10/12/2017 8:05:40 AM Also confirmed by Sherwood Gambler [phone]), editor Lynder Parents [id number])  on 10/12/2017 8:43:14 AM   Radiology Dg Chest 2 View  Result Date: 10/12/2017 CLINICAL DATA:  Cough for 1 month EXAM: CHEST - 2 VIEW COMPARISON:  09/24/2017 FINDINGS: Cardiac shadow is within normal limits. The lungs are well aerated bilaterally. Chronic peribronchial changes are again seen and stable. No focal infiltrate or sizable effusion is noted. No bony abnormality is seen. IMPRESSION: Stable appearance of the chest with chronic changes. No acute abnormality noted. Electronically Signed   By: Inez Catalina M.D.   On: 10/12/2017 08:57    Procedures Procedures (including critical care time)  Medications Ordered in ED Medications - No data to display   Initial Impression / Assessment and Plan / ED Course  I have reviewed the triage vital signs and the nursing notes.  Pertinent labs & imaging results that were available during my care of the patient were reviewed by me and considered in my medical decision making (see chart for details).     Pt counseled on positive pregnancy test.  Pt advised to schedule ob care.  I will treat with zithromax.  Pt advised to continue to use her inhaler  Final Clinical Impressions(s) / ED Diagnoses   Final diagnoses:  Acute bronchitis, unspecified organism  Less than [redacted] weeks gestation of pregnancy    ED  Discharge Orders         Ordered    azithromycin (ZITHROMAX) 250 MG tablet  Daily     10/12/17 0914        An After Visit Summary was printed and given to the patient.    Fransico Meadow, PA-C 10/12/17 6294    Sherwood Gambler, MD 10/12/17 [phone]

## 2017-10-12 NOTE — ED Triage Notes (Signed)
Patient to ED c/o cough x 1 month with new CP and increased SOB. Patient reports she was here a couple weeks ago and given prednisone without relief. CP worse with cough and deep breaths. Resp e/u, skin warm/dry.

## 2017-10-12 NOTE — Discharge Instructions (Addendum)
Return if any problems.  Continue using your inhaler.  Schedule prenatal care.

## 2017-10-16 ENCOUNTER — Inpatient Hospital Stay (HOSPITAL_COMMUNITY)
Admission: EM | Admit: 2017-10-16 | Discharge: 2017-10-19 | DRG: 871 | Disposition: A | Discharge status: Other Acute Inpatient Hospital | Payer: Medicaid Other | Attending: Pulmonary Disease | Admitting: Pulmonary Disease

## 2017-10-16 ENCOUNTER — Emergency Department (HOSPITAL_COMMUNITY): Payer: Medicaid Other

## 2017-10-16 ENCOUNTER — Encounter (HOSPITAL_COMMUNITY): Payer: Self-pay | Admitting: Emergency Medicine

## 2017-10-16 DIAGNOSIS — J9601 Acute respiratory failure with hypoxia: Secondary | ICD-10-CM | POA: Diagnosis present

## 2017-10-16 DIAGNOSIS — Z3490 Encounter for supervision of normal pregnancy, unspecified, unspecified trimester: Secondary | ICD-10-CM

## 2017-10-16 DIAGNOSIS — F17210 Nicotine dependence, cigarettes, uncomplicated: Secondary | ICD-10-CM | POA: Diagnosis present

## 2017-10-16 DIAGNOSIS — O161 Unspecified maternal hypertension, first trimester: Secondary | ICD-10-CM | POA: Diagnosis present

## 2017-10-16 DIAGNOSIS — Z3A13 13 weeks gestation of pregnancy: Secondary | ICD-10-CM

## 2017-10-16 DIAGNOSIS — Z4659 Encounter for fitting and adjustment of other gastrointestinal appliance and device: Secondary | ICD-10-CM

## 2017-10-16 DIAGNOSIS — I959 Hypotension, unspecified: Secondary | ICD-10-CM | POA: Diagnosis present

## 2017-10-16 DIAGNOSIS — Z79899 Other long term (current) drug therapy: Secondary | ICD-10-CM | POA: Diagnosis not present

## 2017-10-16 DIAGNOSIS — A419 Sepsis, unspecified organism: Secondary | ICD-10-CM

## 2017-10-16 DIAGNOSIS — B9789 Other viral agents as the cause of diseases classified elsewhere: Secondary | ICD-10-CM | POA: Diagnosis present

## 2017-10-16 DIAGNOSIS — Z3A01 Less than 8 weeks gestation of pregnancy: Secondary | ICD-10-CM

## 2017-10-16 DIAGNOSIS — J80 Acute respiratory distress syndrome: Secondary | ICD-10-CM | POA: Diagnosis present

## 2017-10-16 DIAGNOSIS — Z452 Encounter for adjustment and management of vascular access device: Secondary | ICD-10-CM

## 2017-10-16 DIAGNOSIS — J189 Pneumonia, unspecified organism: Secondary | ICD-10-CM | POA: Diagnosis present

## 2017-10-16 DIAGNOSIS — O99511 Diseases of the respiratory system complicating pregnancy, first trimester: Secondary | ICD-10-CM | POA: Diagnosis present

## 2017-10-16 DIAGNOSIS — R0902 Hypoxemia: Secondary | ICD-10-CM

## 2017-10-16 DIAGNOSIS — R0602 Shortness of breath: Secondary | ICD-10-CM | POA: Diagnosis present

## 2017-10-16 DIAGNOSIS — J9600 Acute respiratory failure, unspecified whether with hypoxia or hypercapnia: Secondary | ICD-10-CM

## 2017-10-16 DIAGNOSIS — Z7951 Long term (current) use of inhaled steroids: Secondary | ICD-10-CM | POA: Diagnosis not present

## 2017-10-16 DIAGNOSIS — O99331 Smoking (tobacco) complicating pregnancy, first trimester: Secondary | ICD-10-CM | POA: Diagnosis present

## 2017-10-16 DIAGNOSIS — R609 Edema, unspecified: Secondary | ICD-10-CM

## 2017-10-16 HISTORY — DX: Sepsis, unspecified organism: A41.9

## 2017-10-16 HISTORY — DX: Pneumonia, unspecified organism: J18.9

## 2017-10-16 HISTORY — DX: Acute respiratory failure with hypoxia: J96.01

## 2017-10-16 LAB — HEPATIC FUNCTION PANEL
ALT: 24 U/L (ref 0–44)
AST: 33 U/L (ref 15–41)
Albumin: 3.5 g/dL (ref 3.5–5.0)
Alkaline Phosphatase: 75 U/L (ref 38–126)
BILIRUBIN INDIRECT: 0.3 mg/dL (ref 0.3–0.9)
Bilirubin, Direct: 0.3 mg/dL — ABNORMAL HIGH (ref 0.0–0.2)
TOTAL PROTEIN: 7.3 g/dL (ref 6.5–8.1)
Total Bilirubin: 0.6 mg/dL (ref 0.3–1.2)

## 2017-10-16 LAB — CBC WITH DIFFERENTIAL/PLATELET
BASOS ABS: 0 10*3/uL (ref 0.0–0.1)
Basophils Relative: 0 %
EOS PCT: 2 %
Eosinophils Absolute: 0.6 10*3/uL (ref 0.0–0.7)
HCT: 39.1 % (ref 36.0–46.0)
HEMOGLOBIN: 13.5 g/dL (ref 12.0–15.0)
Lymphocytes Relative: 10 %
Lymphs Abs: 2.4 10*3/uL (ref 0.7–4.0)
MCH: 32.5 pg (ref 26.0–34.0)
MCHC: 34.5 g/dL (ref 30.0–36.0)
MCV: 94.2 fL (ref 78.0–100.0)
Monocytes Absolute: 0.8 10*3/uL (ref 0.1–1.0)
Monocytes Relative: 3 %
NEUTROS ABS: 20.9 10*3/uL — AB (ref 1.7–7.7)
NEUTROS PCT: 85 %
PLATELETS: 272 10*3/uL (ref 150–400)
RBC: 4.15 MIL/uL (ref 3.87–5.11)
RDW: 12.9 % (ref 11.5–15.5)
WBC: 24.7 10*3/uL — AB (ref 4.0–10.5)

## 2017-10-16 LAB — I-STAT CG4 LACTIC ACID, ED: Lactic Acid, Venous: 0.94 mmol/L (ref 0.5–1.9)

## 2017-10-16 LAB — BASIC METABOLIC PANEL
ANION GAP: 11 (ref 5–15)
BUN: <5 mg/dL — ABNORMAL LOW (ref 6–20)
CO2: 25 mmol/L (ref 22–32)
Calcium: 9.4 mg/dL (ref 8.9–10.3)
Chloride: 103 mmol/L (ref 98–111)
Creatinine, Ser: 0.46 mg/dL (ref 0.44–1.00)
GFR calc Af Amer: >60 mL/min (ref >60)
GLUCOSE: 102 mg/dL — AB (ref 70–99)
POTASSIUM: 3.4 mmol/L — AB (ref 3.5–5.1)
Sodium: 139 mmol/L (ref 135–145)

## 2017-10-16 LAB — HCG, QUANTITATIVE, PREGNANCY: HCG, BETA CHAIN, QUANT, S: 53206 m[IU]/mL — AB (ref <5)

## 2017-10-16 LAB — LIPASE, BLOOD: LIPASE: 19 U/L (ref 11–51)

## 2017-10-16 LAB — D-DIMER, QUANTITATIVE (NOT AT ARMC): D DIMER QUANT: 1.22 ug{FEU}/mL — AB (ref 0.00–0.50)

## 2017-10-16 LAB — ETHANOL

## 2017-10-16 MED ORDER — SODIUM CHLORIDE 0.9 % IV SOLN
1.0000 g | Freq: Three times a day (TID) | INTRAVENOUS | Status: DC
  Administered 2017-10-17 – 2017-10-19 (×8): 1 g via INTRAVENOUS
  Filled 2017-10-16 (×9): qty 1

## 2017-10-16 MED ORDER — SODIUM CHLORIDE 0.9 % IV BOLUS
500.0000 mL | Freq: Once | INTRAVENOUS | Status: AC
  Administered 2017-10-16: 500 mL via INTRAVENOUS

## 2017-10-16 MED ORDER — ONDANSETRON HCL 4 MG PO TABS: 4.0000 mg | ORAL_TABLET | Freq: Four times a day (QID) | ORAL | Status: DC | PRN

## 2017-10-16 MED ORDER — ALBUTEROL SULFATE (2.5 MG/3ML) 0.083% IN NEBU
5.0000 mg | INHALATION_SOLUTION | Freq: Once | RESPIRATORY_TRACT | Status: AC
  Administered 2017-10-16: 5 mg via RESPIRATORY_TRACT
  Filled 2017-10-16: qty 6

## 2017-10-16 MED ORDER — ONDANSETRON HCL 4 MG/2ML IJ SOLN: 4.0000 mg | Freq: Four times a day (QID) | INTRAMUSCULAR | Status: DC | PRN

## 2017-10-16 MED ORDER — IOPAMIDOL (ISOVUE-370) INJECTION 76%
INTRAVENOUS | Status: AC
  Filled 2017-10-16: qty 100

## 2017-10-16 MED ORDER — ACETAMINOPHEN 650 MG RE SUPP
650.0000 mg | Freq: Four times a day (QID) | RECTAL | Status: DC | PRN
  Administered 2017-10-19: 650 mg via RECTAL
  Filled 2017-10-16: qty 1

## 2017-10-16 MED ORDER — POTASSIUM CHLORIDE IN NACL 20-0.9 MEQ/L-% IV SOLN
INTRAVENOUS | Status: AC
  Administered 2017-10-16: 22:00:00 via INTRAVENOUS
  Filled 2017-10-16: qty 1000

## 2017-10-16 MED ORDER — BENZONATATE 100 MG PO CAPS: 200.0000 mg | ORAL_CAPSULE | Freq: Two times a day (BID) | ORAL | Status: DC | PRN

## 2017-10-16 MED ORDER — VANCOMYCIN HCL 10 G IV SOLR
1250.0000 mg | Freq: Two times a day (BID) | INTRAVENOUS | Status: DC
  Administered 2017-10-17 – 2017-10-19 (×5): 1250 mg via INTRAVENOUS
  Filled 2017-10-16 (×6): qty 1250

## 2017-10-16 MED ORDER — IPRATROPIUM-ALBUTEROL 0.5-2.5 (3) MG/3ML IN SOLN
3.0000 mL | Freq: Once | RESPIRATORY_TRACT | Status: AC
  Administered 2017-10-16: 3 mL via RESPIRATORY_TRACT
  Filled 2017-10-16: qty 3

## 2017-10-16 MED ORDER — ACETAMINOPHEN 325 MG PO TABS
650.0000 mg | ORAL_TABLET | Freq: Four times a day (QID) | ORAL | Status: DC | PRN
  Administered 2017-10-16: 650 mg via ORAL
  Filled 2017-10-16: qty 2

## 2017-10-16 MED ORDER — VANCOMYCIN HCL 10 G IV SOLR
1500.0000 mg | Freq: Once | INTRAVENOUS | Status: AC
  Administered 2017-10-16: 1500 mg via INTRAVENOUS
  Filled 2017-10-16: qty 1500

## 2017-10-16 MED ORDER — SODIUM CHLORIDE 0.9 % IV SOLN
INTRAVENOUS | Status: DC
  Administered 2017-10-16: 19:00:00 via INTRAVENOUS

## 2017-10-16 MED ORDER — SODIUM CHLORIDE 0.9 % IV SOLN
2.0000 g | Freq: Once | INTRAVENOUS | Status: AC
  Administered 2017-10-16: 2 g via INTRAVENOUS
  Filled 2017-10-16: qty 2

## 2017-10-16 MED ORDER — IOPAMIDOL (ISOVUE-370) INJECTION 76%
100.0000 mL | Freq: Once | INTRAVENOUS | Status: AC | PRN
  Administered 2017-10-16: 100 mL via INTRAVENOUS

## 2017-10-16 MED ORDER — ENOXAPARIN SODIUM 40 MG/0.4ML ~~LOC~~ SOLN
40.0000 mg | Freq: Every day | SUBCUTANEOUS | Status: DC
  Administered 2017-10-16 – 2017-10-18 (×3): 40 mg via SUBCUTANEOUS
  Filled 2017-10-16 (×3): qty 0.4

## 2017-10-16 MED ORDER — METHYLPREDNISOLONE SODIUM SUCC 125 MG IJ SOLR
125.0000 mg | Freq: Once | INTRAMUSCULAR | Status: AC
  Administered 2017-10-16: 125 mg via INTRAVENOUS
  Filled 2017-10-16: qty 2

## 2017-10-16 MED ORDER — ALBUTEROL SULFATE (2.5 MG/3ML) 0.083% IN NEBU
2.5000 mg | INHALATION_SOLUTION | Freq: Four times a day (QID) | RESPIRATORY_TRACT | Status: DC | PRN
  Administered 2017-10-17 (×2): 2.5 mg via RESPIRATORY_TRACT
  Filled 2017-10-16 (×2): qty 3

## 2017-10-16 MED ORDER — TRAZODONE HCL 50 MG PO TABS: 50.0000 mg | ORAL_TABLET | Freq: Once | ORAL | Status: DC

## 2017-10-16 MED ORDER — SODIUM CHLORIDE 0.9% FLUSH
3.0000 mL | Freq: Two times a day (BID) | INTRAVENOUS | Status: DC
  Administered 2017-10-17 – 2017-10-19 (×4): 3 mL via INTRAVENOUS

## 2017-10-16 NOTE — H&P (Signed)
History and Physical    Catherine Cannon JKD:[id number] DOB: 05-20-1980 DOA: 10/16/2017  PCP: Patient, No Pcp Per   Patient coming from: Home   Chief Complaint: Productive cough, SOB   HPI: Catherine Cannon is a 37 y.o. female with medical history significant for drug and alcohol abuse in remission, presenting to the emergency department for evaluation of shortness of breath and productive cough.  Patient reports that she developed some upper respiratory symptoms approximately a month ago with rhinorrhea and sore throat, and as those symptoms began to resolve, she developed a cough that has progressively worsened.  She was recently treated with prednisone and azithromycin, but continued to worsen despite this.  She now reports shortness of breath at rest, worse with any exertion, and continues to have productive cough.  She denies any chest pain at this time and denies any tenderness in the lower extremities though she may have noted some mild swelling.  She denies lower abdominal or pelvic pain, vaginal bleeding, or discharge.  ED Course: Upon arrival to the ED, patient is found to be afebrile, saturating mid 80s on room air, tachypneic to 30, slightly tachycardic, and with stable blood pressure.  EKG features a sinus tachycardia with rate 106 and chest x-ray reveals bilateral lung disease suspicious for a multifocal pneumonia.  Chemistry panel is notable for slight hypokalemia, CBC features a leukocytosis to 24,700, d-dimer is elevated at 1.22, and lactic acid is reassuringly normal.  Right lower extremity venous Doppler was negative for DVT and CTA chest is negative for a central PE, but notable for patchy bilateral pulmonary infiltrates with groundglass component and smaller regions of denser consolidations concerning for a multifocal pneumonia.  Blood cultures were collected and the patient was treated with IV fluids, nebs, IV Solu-Medrol, and after discussion with pharmacy, cefepime and vancomycin.   Blood pressure remained stable, patient is on 6 L/min of supplemental oxygen with saturation in the mid 90s, and she will be admitted for ongoing evaluation and management of acute hypoxic respiratory failure with suspected multifocal pneumonia.  Review of Systems:  All other systems reviewed and apart from HPI, are negative.  History reviewed. No pertinent past medical history.  History reviewed. No pertinent surgical history.   reports that she has been smoking cigarettes. She has been smoking about 1.00 pack per day. She has never used smokeless tobacco. She reports that she drinks alcohol. She reports that she has current or past drug history. Drug: Cocaine.  No Known Allergies  Family History  Problem Relation Age of Onset  . Asthma Mother   . Cancer Other      Prior to Admission medications   Medication Sig Start Date End Date Taking? Authorizing Provider  albuterol (PROVENTIL HFA;VENTOLIN HFA) 108 (90 Base) MCG/ACT inhaler Inhale 1-2 puffs into the lungs every 6 (six) hours as needed for wheezing or shortness of breath. 10/12/17  Yes Caryl Ada K, PA-C  ibuprofen (ADVIL,MOTRIN) 600 MG tablet Take 1 tablet (600 mg total) by mouth every 6 (six) hours as needed. 04/23/16  Yes Nona Dell, PA-C  amoxicillin (AMOXIL) 500 MG capsule Take 1 capsule (500 mg total) 3 (three) times daily by mouth. Patient not taking: Reported on 10/16/2017 01/05/17   Petrucelli, Glynda Jaeger, PA-C  azithromycin (ZITHROMAX) 250 MG tablet Take 1 tablet (250 mg total) by mouth daily. Take first 2 tablets together, then 1 every day until finished. Patient not taking: Reported on 10/16/2017 10/12/17   Fransico Meadow, PA-C  Physical Exam: Vitals:   10/16/17 1455 10/16/17 1508 10/16/17 1551 10/16/17 1803  BP: 131/85  (!) 164/118 139/83  Pulse: (!) 114 (!) 107 (!) 107 (!) 108  Resp: (!) 26  (!) 29 (!) 24  Temp: 98.4 F (36.9 C)     TempSrc: Oral     SpO2: 90% (!) 88% 92% 92%       Constitutional: Tachypneic, calm  Eyes: PERTLA, lids and conjunctivae normal ENMT: Mucous membranes are moist. Posterior pharynx clear of any exudate or lesions.   Neck: normal, supple, no masses, no thyromegaly Respiratory: Tachypnea, mild dyspnea with speech. Coarse rhonchi bilaterally. No accessory muscle use.  Cardiovascular: Rate ~110 and regular. No extremity edema.   Abdomen: No distension, no tenderness, soft. Bowel sounds normal.  Musculoskeletal: no clubbing / cyanosis. No joint deformity upper and lower extremities.   Skin: no significant rashes, lesions, ulcers. Warm, dry, well-perfused. Neurologic: CN 2-12 grossly intact. Sensation intact. Strength 5/5 in all 4 limbs.  Psychiatric: Alert and oriented x 3. Calm, cooperative.     Labs on Admission: I have personally reviewed following labs and imaging studies  CBC: Recent Labs  Lab 10/12/17 0759 10/16/17 1629  WBC 15.3* 24.7*  NEUTROABS  --  20.9*  HGB 13.3 13.5  HCT 38.8 39.1  MCV 95.1 94.2  PLT 250 725   Basic Metabolic Panel: Recent Labs  Lab 10/12/17 0759 10/16/17 1629  NA 134* 139  K 3.7 3.4*  CL 102 103  CO2 22 25  GLUCOSE 105* 102*  BUN <5* <5*  CREATININE 0.55 0.46  CALCIUM 9.1 9.4   GFR: Estimated Creatinine Clearance: 112.8 mL/min (by C-G formula based on SCr of 0.46 mg/dL). Liver Function Tests: Recent Labs  Lab 10/16/17 1629  AST 33  ALT 24  ALKPHOS 75  BILITOT 0.6  PROT 7.3  ALBUMIN 3.5   Recent Labs  Lab 10/16/17 1629  LIPASE 19   No results for input(s): AMMONIA in the last 168 hours. Coagulation Profile: No results for input(s): INR, PROTIME in the last 168 hours. Cardiac Enzymes: No results for input(s): CKTOTAL, CKMB, CKMBINDEX, TROPONINI in the last 168 hours. BNP (last 3 results) No results for input(s): PROBNP in the last 8760 hours. HbA1C: No results for input(s): HGBA1C in the last 72 hours. CBG: No results for input(s): GLUCAP in the last 168  hours. Lipid Profile: No results for input(s): CHOL, HDL, LDLCALC, TRIG, CHOLHDL, LDLDIRECT in the last 72 hours. Thyroid Function Tests: No results for input(s): TSH, T4TOTAL, FREET4, T3FREE, THYROIDAB in the last 72 hours. Anemia Panel: No results for input(s): VITAMINB12, FOLATE, FERRITIN, TIBC, IRON, RETICCTPCT in the last 72 hours. Urine analysis:    Component Value Date/Time   COLORURINE YELLOW 03/16/2016 [place] 03/16/2016 1753   LABSPEC 1.006 03/16/2016 1753   PHURINE 7.0 03/16/2016 1753   GLUCOSEU NEGATIVE 03/16/2016 1753   HGBUR LARGE (A) 03/16/2016 1753   BILIRUBINUR NEGATIVE 03/16/2016 1753   KETONESUR NEGATIVE 03/16/2016 1753   PROTEINUR NEGATIVE 03/16/2016 1753   UROBILINOGEN 0.2 07/05/2009 0057   NITRITE NEGATIVE 03/16/2016 1753   LEUKOCYTESUR TRACE (A) 03/16/2016 1753   Sepsis Labs: @LABRCNTIP (procalcitonin:4,lacticidven:4) )No results found for this or any previous visit (from the past 240 hour(s)).   Radiological Exams on Admission: Dg Chest 2 View  Result Date: 10/16/2017 CLINICAL DATA:  37 year old with shortness of breath and cough. EXAM: CHEST - 2 VIEW COMPARISON:  10/12/2017 FINDINGS: Scattered lung opacities throughout both lungs. Evidence for  airspace disease in the right upper lung with patchy and possibly nodular densities at the right lung base. Patchy densities throughout the mid and lower left lung. Heart and mediastinum are within normal limits. Trachea is midline. No large pleural effusions. IMPRESSION: Bilateral lung disease. Findings are suggestive for multifocal pneumonia. Followup PA and lateral chest X-ray is recommended in 3-4 weeks following trial of antibiotic therapy to ensure resolution and exclude underlying malignancy. Electronically Signed   By: Markus Daft M.D.   On: 10/16/2017 16:04   Ct Angio Chest Pe W/cm &/or Wo Cm  Result Date: 10/16/2017 CLINICAL DATA:  Shortness of breath. EXAM: CT ANGIOGRAPHY CHEST WITH CONTRAST  TECHNIQUE: Multidetector CT imaging of the chest was performed using the standard protocol during bolus administration of intravenous contrast. Multiplanar CT image reconstructions and MIPs were obtained to evaluate the vascular anatomy. CONTRAST:  140mL ISOVUE-370 IOPAMIDOL (ISOVUE-370) INJECTION 76% COMPARISON:  Chest x-ray October 16, 2017.  Chest CT Jul 05, 2009. FINDINGS: Cardiovascular: The heart is normal. The thoracic aorta is nonaneurysmal with no dissection or atherosclerosis. Evaluation of pulmonary arteries is limited due to respiratory motion and timing of contrast. However, the central pulmonary arteries are well assessed with no central filling defects noted. Mediastinum/Nodes: The thyroid and esophagus are normal. Shotty nodes in the mediastinum are likely reactive without gross adenopathy. The chest wall is unremarkable. Lungs/Pleura: Central airways are normal. No pneumothorax. Patchy bilateral pulmonary infiltrates involving all lobes. While there are some regions of consolidation, there are large ground-glass components. No nodules or masses noted. Upper Abdomen: No acute abnormality. Musculoskeletal: No chest wall abnormality. No acute or significant osseous findings. Review of the MIP images confirms the above findings. IMPRESSION: 1. Patchy bilateral pulmonary infiltrates with large ground-glass components and smaller regions of denser consolidation. A multifocal pneumonia is possible. The large ground-glass components raise the possibility of an atypical or opportunistic infection. However, there are non infectious causes as well including but not limited to acute eosinophilic pneumonia, acute interstitial pneumonia, and hypersensitivity pneumonias. Edema is a possibility but cardiogenic edema would be considered less likely given patient age, stage of pregnancy, and lack of cardiomegaly. 2. Evaluation for pulmonary emboli is limited due to respiratory motion and timing of the contrast bolus.  However, the central pulmonary arteries are well seen with no emboli. The study was not repeated due to the patient's pregnant status. If high clinical concern remains for distal pulmonary emboli, the study could be repeated. Electronically Signed   By: Dorise Bullion III M.D   On: 10/16/2017 19:23    EKG: Independently reviewed. Sinus tachycardia (rate 106).   Assessment/Plan   1. Multifocal pneumonia; acute hypoxic respiratory failure  - Presents with productive cough, SOB, and malaise, continued to worsen despite treatment with azithromycin and prednisone  - Found to have O2-saturation in 80's at rest with 4-6 Lpm supplemental O2 required to maintain sat of low-mid 90's  - CTA is negative for central PE, but with multifocal opacities bilaterally  - ED physician conferred with pharmacy, treatment with vancomycin and cefepime was recommended  - Blood cultures collected in ED, will add sputum cultures and check strep pneumo and legionella antigens, continue current antibiotics, and continue supportive care with supplemental O2 and prn APAP   2. Pregnancy  - Pregnancy test was positive in ED on 10/12/17  - Quantitative hCG 53k today  - No bleeding, discharge, or pelvic pain - She is planning to establish with OB after hospital discharge  DVT prophylaxis: Lovenox Code Status: Full  Family Communication: Discussed with patient  Consults called: None Admission status: Inpatient     Vianne Bulls, MD [facility] Pager [phone]  If 7PM-7AM, please contact night-coverage [email or web address] Password [facility]  10/16/2017, 8:52 PM

## 2017-10-16 NOTE — Progress Notes (Signed)
A consult was received from an ED physician for vancomycin per pharmacy dosing.  The patient's profile has been reviewed for ht/wt/allergies/indication/available labs.   A one time order has been placed for vancomycin 1500mg    Further antibiotics/pharmacy consults should be ordered by admitting physician if indicated.                       Thank you, Dolly Rias RPh 10/16/2017, 5:19 PM Pager [phone]

## 2017-10-16 NOTE — Progress Notes (Signed)
Pharmacy Antibiotic Note  Catherine Cannon is a 37 y.o. pregnant (suspects to be 2 months along) female presented to the ED on 10/16/2017 with c/o SOB.  To start vancomycin for PNA.  - scr 0.46 (crcl~100)  Plan: - vancomycin 1500 mg IV x1 given in the ED at [facility], then 1250 mg IV q12h for est AUC 450 - cefepime 1gm IV q8h per MD  _________________________________  Temp (24hrs), Avg:98.4 F (36.9 C), Min:98.4 F (36.9 C), Max:98.4 F (36.9 C)  Recent Labs  Lab 10/12/17 0759 10/16/17 1629 10/16/17 1754  WBC 15.3* 24.7*  --   CREATININE 0.55 0.46  --   LATICACIDVEN  --   --  0.94    Estimated Creatinine Clearance: 112.8 mL/min (by C-G formula based on SCr of 0.46 mg/dL).    No Known Allergies   Thank you for allowing pharmacy to be a part of this patient's care.  Lynelle Doctor 10/16/2017 8:58 PM

## 2017-10-16 NOTE — ED Provider Notes (Addendum)
[facility] DEPT Provider Note   CSN: [id number] Arrival date & time: 10/16/17  1442     History   Chief Complaint Chief Complaint  Patient presents with  . Shortness of Breath    HPI Catherine Cannon is a 37 y.o. female.  Patient brought in by POV.  Patient with persistent cough worsening shortness of breath.  Patient also seen August 20 and August 2 for similar complaint.  On August 20 patient was started on azithromycin.  Chest x-rays have been negative.  They did determine that patient unexpectedly was pregnant.  Probably within the first 8 weeks.  Patient just with persistent cough and shortness of breath.  Patient is also been on a course of prednisone which helped a little bit.  Patient presented here hypoxic oxygen saturations in the low 80s on room air.  Patient started on oxygen and brought back to resuscitation room.  Patient still awake and mentating fine.  Patient has a past history of occasional binge drinking.  States she has not been drinking lately.  And has no trouble with withdrawal.  Patient also with complaint of some bilateral leg swelling but the left leg is been more swollen than usual.  Also early on on the illness patient had predominant left-sided anterior chest pain.  Before all the coughing and other symptoms started.  Patient does have chest soreness and chest pain with coughing.  Patient's first pregnancy.  No vaginal bleeding.  No pelvic pain.     History reviewed. No pertinent past medical history.  There are no active problems to display for this patient.   History reviewed. No pertinent surgical history.   OB History    Gravida  1   Para      Term      Preterm      AB      Living        SAB      TAB      Ectopic      Multiple      Live Births               Home Medications    Prior to Admission medications   Medication Sig Start Date End Date Taking? Authorizing Provider  albuterol  (PROVENTIL HFA;VENTOLIN HFA) 108 (90 Base) MCG/ACT inhaler Inhale 1-2 puffs into the lungs every 6 (six) hours as needed for wheezing or shortness of breath. 10/12/17  Yes Caryl Ada K, PA-C  ibuprofen (ADVIL,MOTRIN) 600 MG tablet Take 1 tablet (600 mg total) by mouth every 6 (six) hours as needed. 04/23/16  Yes Nona Dell, PA-C  amoxicillin (AMOXIL) 500 MG capsule Take 1 capsule (500 mg total) 3 (three) times daily by mouth. Patient not taking: Reported on 10/16/2017 01/05/17   Petrucelli, Glynda Jaeger, PA-C  azithromycin (ZITHROMAX) 250 MG tablet Take 1 tablet (250 mg total) by mouth daily. Take first 2 tablets together, then 1 every day until finished. Patient not taking: Reported on 10/16/2017 10/12/17   Sidney Ace    Family History Family History  Problem Relation Age of Onset  . Asthma Mother   . Cancer Other     Social History Social History   Tobacco Use  . Smoking status: Current Every Day Smoker    Packs/day: 1.00    Types: Cigarettes  . Smokeless tobacco: Never Used  Substance Use Topics  . Alcohol use: Yes    Comment: occ   . Drug use: Yes  Types: Cocaine     Allergies   Patient has no known allergies.   Review of Systems Review of Systems  Constitutional: Negative for fever.  HENT: Positive for congestion.   Eyes: Negative for visual disturbance.  Respiratory: Positive for cough, shortness of breath and wheezing.   Cardiovascular: Positive for chest pain and leg swelling.  Gastrointestinal: Positive for nausea. Negative for abdominal pain and vomiting.  Genitourinary: Negative for dysuria and vaginal bleeding.  Musculoskeletal: Negative for back pain.  Skin: Negative for rash.  Neurological: Negative for syncope.  Hematological: Does not bruise/bleed easily.  Psychiatric/Behavioral: Negative for confusion.     Physical Exam Updated Vital Signs BP 139/83   Pulse (!) 108   Temp 98.4 F (36.9 C) (Oral)   Resp (!) 24   LMP  07/14/2017 (Exact Date) Comment: irregular  SpO2 92%   Physical Exam  Constitutional: She is oriented to person, place, and time. She appears well-developed and well-nourished. She appears distressed.  HENT:  Head: Normocephalic and atraumatic.  Mucous membranes slightly dry.  Eyes: Pupils are equal, round, and reactive to light. Conjunctivae and EOM are normal.  Neck: Neck supple.  Cardiovascular: Normal rate, regular rhythm and normal heart sounds.  Pulmonary/Chest: No stridor. She is in respiratory distress. She has wheezes. She has rales.  Abdominal: Soft. Bowel sounds are normal. There is no tenderness.  Musculoskeletal: Normal range of motion. She exhibits edema.  Left leg swelling to the foot and ankle area.  Good cap refill both feet.  Neurological: She is alert and oriented to person, place, and time. No cranial nerve deficit or sensory deficit. She exhibits normal muscle tone. Coordination normal.  Skin: Skin is warm. Capillary refill takes less than 2 seconds. No rash noted.  Nursing note and vitals reviewed.    ED Treatments / Results  Labs (all labs ordered are listed, but only abnormal results are displayed) Labs Reviewed  HCG, QUANTITATIVE, PREGNANCY - Abnormal; Notable for the following components:      Result Value   hCG, Beta Chain, Quant, S 53,206 (*)    All other components within normal limits  CBC WITH DIFFERENTIAL/PLATELET - Abnormal; Notable for the following components:   WBC 24.7 (*)    Neutro Abs 20.9 (*)    All other components within normal limits  BASIC METABOLIC PANEL - Abnormal; Notable for the following components:   Potassium 3.4 (*)    Glucose, Bld 102 (*)    BUN <5 (*)    All other components within normal limits  D-DIMER, QUANTITATIVE (NOT AT [facility]) - Abnormal; Notable for the following components:   D-Dimer, Quant 1.22 (*)    All other components within normal limits  HEPATIC FUNCTION PANEL - Abnormal; Notable for the following components:    Bilirubin, Direct 0.3 (*)    All other components within normal limits  CULTURE, BLOOD (ROUTINE X 2)  CULTURE, BLOOD (ROUTINE X 2)  LIPASE, BLOOD  ETHANOL  I-STAT CG4 LACTIC ACID, ED  I-STAT CG4 LACTIC ACID, ED    EKG None   ED ECG REPORT   Date: 10/16/2017  Rate: 106  Rhythm: sinus tachycardia  QRS Axis: normal  Intervals: normal  ST/T Wave abnormalities: nonspecific ST changes  Conduction Disutrbances:none  Narrative Interpretation:   Old EKG Reviewed: none available  I have personally reviewed the EKG tracing and agree with the computerized printout as noted.  Radiology Dg Chest 2 View  Result Date: 10/16/2017 CLINICAL DATA:  37 year old with shortness of  breath and cough. EXAM: CHEST - 2 VIEW COMPARISON:  10/12/2017 FINDINGS: Scattered lung opacities throughout both lungs. Evidence for airspace disease in the right upper lung with patchy and possibly nodular densities at the right lung base. Patchy densities throughout the mid and lower left lung. Heart and mediastinum are within normal limits. Trachea is midline. No large pleural effusions. IMPRESSION: Bilateral lung disease. Findings are suggestive for multifocal pneumonia. Followup PA and lateral chest X-ray is recommended in 3-4 weeks following trial of antibiotic therapy to ensure resolution and exclude underlying malignancy. Electronically Signed   By: Markus Daft M.D.   On: 10/16/2017 16:04   Ct Angio Chest Pe W/cm &/or Wo Cm  Result Date: 10/16/2017 CLINICAL DATA:  Shortness of breath. EXAM: CT ANGIOGRAPHY CHEST WITH CONTRAST TECHNIQUE: Multidetector CT imaging of the chest was performed using the standard protocol during bolus administration of intravenous contrast. Multiplanar CT image reconstructions and MIPs were obtained to evaluate the vascular anatomy. CONTRAST:  135mL ISOVUE-370 IOPAMIDOL (ISOVUE-370) INJECTION 76% COMPARISON:  Chest x-ray October 16, 2017.  Chest CT Jul 05, 2009. FINDINGS: Cardiovascular: The  heart is normal. The thoracic aorta is nonaneurysmal with no dissection or atherosclerosis. Evaluation of pulmonary arteries is limited due to respiratory motion and timing of contrast. However, the central pulmonary arteries are well assessed with no central filling defects noted. Mediastinum/Nodes: The thyroid and esophagus are normal. Shotty nodes in the mediastinum are likely reactive without gross adenopathy. The chest wall is unremarkable. Lungs/Pleura: Central airways are normal. No pneumothorax. Patchy bilateral pulmonary infiltrates involving all lobes. While there are some regions of consolidation, there are large ground-glass components. No nodules or masses noted. Upper Abdomen: No acute abnormality. Musculoskeletal: No chest wall abnormality. No acute or significant osseous findings. Review of the MIP images confirms the above findings. IMPRESSION: 1. Patchy bilateral pulmonary infiltrates with large ground-glass components and smaller regions of denser consolidation. A multifocal pneumonia is possible. The large ground-glass components raise the possibility of an atypical or opportunistic infection. However, there are non infectious causes as well including but not limited to acute eosinophilic pneumonia, acute interstitial pneumonia, and hypersensitivity pneumonias. Edema is a possibility but cardiogenic edema would be considered less likely given patient age, stage of pregnancy, and lack of cardiomegaly. 2. Evaluation for pulmonary emboli is limited due to respiratory motion and timing of the contrast bolus. However, the central pulmonary arteries are well seen with no emboli. The study was not repeated due to the patient's pregnant status. If high clinical concern remains for distal pulmonary emboli, the study could be repeated. Electronically Signed   By: Dorise Bullion III M.D   On: 10/16/2017 19:23    Procedures Procedures (including critical care time)  CRITICAL CARE Performed by: Fredia Sorrow Total critical care time: 30 minutes Critical care time was exclusive of separately billable procedures and treating other patients. Critical care was necessary to treat or prevent imminent or life-threatening deterioration. Critical care was time spent personally by me on the following activities: development of treatment plan with patient and/or surrogate as well as nursing, discussions with consultants, evaluation of patient's response to treatment, examination of patient, obtaining history from patient or surrogate, ordering and performing treatments and interventions, ordering and review of laboratory studies, ordering and review of radiographic studies, pulse oximetry and re-evaluation of patient's condition.   Medications Ordered in ED Medications  0.9 %  sodium chloride infusion ( Intravenous New Bag/Given 10/16/17 1922)  vancomycin (VANCOCIN) 1,500 mg in sodium chloride  0.9 % 500 mL IVPB (1,500 mg Intravenous New Bag/Given 10/16/17 1931)  iopamidol (ISOVUE-370) 76 % injection (has no administration in time range)  albuterol (PROVENTIL) (2.5 MG/3ML) 0.083% nebulizer solution 5 mg (has no administration in time range)  albuterol (PROVENTIL) (2.5 MG/3ML) 0.083% nebulizer solution 5 mg (5 mg Nebulization Given 10/16/17 1604)  ipratropium-albuterol (DUONEB) 0.5-2.5 (3) MG/3ML nebulizer solution 3 mL (3 mLs Nebulization Given 10/16/17 1604)  methylPREDNISolone sodium succinate (SOLU-MEDROL) 125 mg/2 mL injection 125 mg (125 mg Intravenous Given 10/16/17 1624)  ceFEPIme (MAXIPIME) 2 g in sodium chloride 0.9 % 100 mL IVPB (0 g Intravenous Stopped 10/16/17 1841)  sodium chloride 0.9 % bolus 500 mL (0 mLs Intravenous Stopped 10/16/17 1840)  iopamidol (ISOVUE-370) 76 % injection 100 mL (100 mLs Intravenous Contrast Given 10/16/17 1847)     Initial Impression / Assessment and Plan / ED Course  I have reviewed the triage vital signs and the nursing notes.  Pertinent labs & imaging results that  were available during my care of the patient were reviewed by me and considered in my medical decision making (see chart for details).    Patient with cough and some shortness of breath and congestion since early August.  Patient was recently seen in the emergency department August 20 started on a Z-Pak.  Patient says antibiotic did not help.  Was on a steroid treatment which helped a little bit.  Patient was chest wall soreness with coughing.  Illness started out with predominant left anterior chest pain.  Before all the congestion and coughing started.  Patient denied any fevers.  Patient found out during the ED visits that she is pregnant presumed to be early on in pregnancy.  This will be patient's first pregnancy.  hCG here today was 50,000 probably she is in the first 2 months of pregnancy.  No concern for an ectopic pregnancy or miscarriage at this time.  Patient arrives significantly hypoxic.  On 4 L of oxygen oxygen sats came into the low 90s.  Patient had 2 breathing treatments with some improvement.  Chest x-ray consistent with multifocal pneumonia.  But other things in the differential are possible.  Doubt that his congestive heart failure.  Patient also had left leg swelling Doppler studies of the left leg were negative for DVT but d-dimer was elevated although notoriously known to be inaccurate during pregnancy but because of this and the abnormal chest x-ray opted not to do VQ scan will went directly to CT angios.  Timing on the contrast was a little off but no obvious or major pulmonary embolus.  Patient is been continued on 4 L of oxygen.  Due to her pregnancy and the fact that you are a Z-Pak contacted pharmacy for consultation on where to go with antibiotics.  They recommend treating it as if it was a healthcare acquired pneumonia with cefepime and vancomycin at these were safe during early stages of pregnancy.  Patient's lactic acid was not elevated.  Patient did have a significant  leukocytosis however she has been on steroids.  Also her symptoms of gotten worse.  Feel that this is most likely a pneumonia.  No evidence of pulmonary embolus.  Patient is in early stages of pregnancy.  In addition patient gave a history of drinking heavily in the past but her liver function test are normal and lipase is normal.  Alcohol screen here today's negative.  Patient denies any history of withdrawal problems denies any binge drinking recently.  EKG not crossing  over into the system.  We will have that repeated.  She is had persistent tachycardia in the low 100s appears to be sinus tach on monitor.  I discussed with hospitalist who will admit.   Final Clinical Impressions(s) / ED Diagnoses   Final diagnoses:  SOB (shortness of breath)  Hypoxia  HCAP (healthcare-associated pneumonia)  Less than [redacted] weeks gestation of pregnancy    ED Discharge Orders    None       Fredia Sorrow, MD 10/16/17 2014    Fredia Sorrow, MD 10/16/17 [place]    Fredia Sorrow, MD 10/16/17 2021

## 2017-10-16 NOTE — ED Triage Notes (Signed)
Patient here from home with complaints of SOB. Cough. Reports that she has been seen for same twice. Diagnosed with bronchitis, steroids no relief.

## 2017-10-16 NOTE — Progress Notes (Signed)
VASCULAR LAB PRELIMINARY  PRELIMINARY  PRELIMINARY  PRELIMINARY  Right lower extremity venous duplex completed.    Preliminary report:  There is no DVT or SVT noted in the right lower extremity.  Gave report to Dr. Reino Kent, [facility], RVT 10/16/2017, 5:20 PM

## 2017-10-16 NOTE — ED Notes (Signed)
Patient transported to X-ray 

## 2017-10-16 NOTE — ED Notes (Signed)
ED TO INPATIENT HANDOFF REPORT  Name/Age/Gender Catherine Cannon 37 y.o. female  Code Status    Code Status Orders  (From admission, onward)         Start     Ordered   10/16/17 2050  Full code  Continuous     10/16/17 2051        Code Status History    This patient has a current code status but no historical code status.      Home/SNF/Other Home  Chief Complaint chest pain SOB  Level of Care/Admitting Diagnosis ED Disposition    ED Disposition Condition Comment   Admit  Hospital Area: [facility] [[id number]]  Level of Care: Stepdown [14]  Admit to SDU based on following criteria: Respiratory Distress:  Frequent assessment and/or intervention to maintain adequate ventilation/respiration, pulmonary toilet, and respiratory treatment.  Diagnosis: Multifocal pneumonia [[id number]]  Admitting Physician: Vianne Bulls [[id number]]  Attending Physician: Vianne Bulls [[id number]]  Estimated length of stay: past midnight tomorrow  Certification:: I certify this patient will need inpatient services for at least 2 midnights  PT Class (Do Not Modify): Inpatient [101]  PT Acc Code (Do Not Modify): Private [1]       Medical History History reviewed. No pertinent past medical history.  Allergies No Known Allergies  IV Location/Drains/Wounds Patient Lines/Drains/Airways Status   Active Line/Drains/Airways    Name:   Placement date:   Placement time:   Site:   Days:   Peripheral IV 10/16/17 Right Antecubital   10/16/17    1700    Antecubital   less than 1          Labs/Imaging Results for orders placed or performed during the hospital encounter of 10/16/17 (from the past 48 hour(s))  hCG, quantitative, pregnancy     Status: Abnormal   Collection Time: 10/16/17  4:29 PM  Result Value Ref Range   hCG, Beta Chain, Quant, S 53,206 (H) <5 mIU/mL    Comment:          GEST. AGE      CONC.  (mIU/mL)   <=1 WEEK        5 - 50     2 WEEKS       50 - 500     3  WEEKS       100 - 10,000     4 WEEKS     1,000 - 30,000     5 WEEKS     3,500 - 115,000   6-8 WEEKS     12,000 - 270,000    12 WEEKS     15,000 - 220,000        FEMALE AND NON-PREGNANT FEMALE:     LESS THAN 5 mIU/mL Performed at [facility], [place] [address], [place],  [address]   CBC with Differential/Platelet     Status: Abnormal   Collection Time: 10/16/17  4:29 PM  Result Value Ref Range   WBC 24.7 (H) 4.0 - 10.5 K/uL   RBC 4.15 3.87 - 5.11 MIL/uL   Hemoglobin 13.5 12.0 - 15.0 g/dL   HCT 39.1 36.0 - 46.0 %   MCV 94.2 78.0 - 100.0 fL   MCH 32.5 26.0 - 34.0 pg   MCHC 34.5 30.0 - 36.0 g/dL   RDW 12.9 11.5 - 15.5 %   Platelets 272 150 - 400 K/uL   Neutrophils Relative % 85 %   Neutro Abs 20.9 (H) 1.7 - 7.7  K/uL   Lymphocytes Relative 10 %   Lymphs Abs 2.4 0.7 - 4.0 K/uL   Monocytes Relative 3 %   Monocytes Absolute 0.8 0.1 - 1.0 K/uL   Eosinophils Relative 2 %   Eosinophils Absolute 0.6 0.0 - 0.7 K/uL   Basophils Relative 0 %   Basophils Absolute 0.0 0.0 - 0.1 K/uL    Comment: Performed at [facility], [place] [address], [place], [place] [address]  Basic metabolic panel     Status: Abnormal   Collection Time: 10/16/17  4:29 PM  Result Value Ref Range   Sodium 139 135 - 145 mmol/L   Potassium 3.4 (L) 3.5 - 5.1 mmol/L   Chloride 103 98 - 111 mmol/L   CO2 25 22 - 32 mmol/L   Glucose, Bld 102 (H) 70 - 99 mg/dL   BUN <5 (L) 6 - 20 mg/dL   Creatinine, Ser 0.46 0.44 - 1.00 mg/dL   Calcium 9.4 8.9 - 10.3 mg/dL   GFR calc non Af Amer >60 >60 mL/min   GFR calc Af Amer >60 >60 mL/min    Comment: (NOTE) The eGFR has been calculated using the CKD EPI equation. This calculation has not been validated in all clinical situations. eGFR's persistently <60 mL/min signify possible Chronic Kidney Disease.    Anion gap 11 5 - 15    Comment: Performed at [facility], [place] [address] [place], [place] [address]  D-dimer,  quantitative (not at [facility])     Status: Abnormal   Collection Time: 10/16/17  4:29 PM  Result Value Ref Range   D-Dimer, Quant 1.22 (H) 0.00 - 0.50 ug/mL-FEU    Comment: (NOTE) At the manufacturer cut-off of 0.50 ug/mL FEU, this assay has been documented to exclude PE with a sensitivity and negative predictive value of 97 to 99%.  At this time, this assay has not been approved by the FDA to exclude DVT/VTE. Results should be correlated with clinical presentation. Performed at [facility], [place] [address] [place], [place] [address]   Lipase, blood     Status: None   Collection Time: 10/16/17  4:29 PM  Result Value Ref Range   Lipase 19 11 - 51 U/L    Comment: Performed at [facility], [place] [address], [place], [place] [address]  Hepatic function panel     Status: Abnormal   Collection Time: 10/16/17  4:29 PM  Result Value Ref Range   Total Protein 7.3 6.5 - 8.1 g/dL   Albumin 3.5 3.5 - 5.0 g/dL   AST 33 15 - 41 U/L   ALT 24 0 - 44 U/L   Alkaline Phosphatase 75 38 - 126 U/L   Total Bilirubin 0.6 0.3 - 1.2 mg/dL   Bilirubin, Direct 0.3 (H) 0.0 - 0.2 mg/dL   Indirect Bilirubin 0.3 0.3 - 0.9 mg/dL    Comment: Performed at [facility], [place] [address], [place], [place] [address]  I-Stat CG4 Lactic Acid, ED     Status: None   Collection Time: 10/16/17  5:54 PM  Result Value Ref Range   Lactic Acid, Venous 0.94 0.5 - 1.9 mmol/L  Ethanol     Status: None   Collection Time: 10/16/17  6:19 PM  Result Value Ref Range   Alcohol, Ethyl (B) <10 <10 mg/dL    Comment: (NOTE) Lowest detectable limit for serum alcohol is 10 mg/dL. For medical purposes only. Performed at [facility], [address] [place], [place] [address]    Dg Chest 2 View  Result Date: 10/16/2017 CLINICAL DATA:  37 year old with shortness of breath and cough. EXAM: CHEST - 2 VIEW COMPARISON:  10/12/2017 FINDINGS: Scattered lung opacities  throughout both lungs. Evidence for airspace disease in the right upper lung with patchy and possibly nodular densities at the right lung base. Patchy densities throughout the mid and lower left lung. Heart and mediastinum are within normal limits. Trachea is midline. No large pleural effusions. IMPRESSION: Bilateral lung disease. Findings are suggestive for multifocal pneumonia. Followup PA and lateral chest X-ray is recommended in 3-4 weeks following trial of antibiotic therapy to ensure resolution and exclude underlying malignancy. Electronically Signed   By: Markus Daft M.D.   On: 10/16/2017 16:04   Ct Angio Chest Pe W/cm &/or Wo Cm  Result Date: 10/16/2017 CLINICAL DATA:  Shortness of breath. EXAM: CT ANGIOGRAPHY CHEST WITH CONTRAST TECHNIQUE: Multidetector CT imaging of the chest was performed using the standard protocol during bolus administration of intravenous contrast. Multiplanar CT image reconstructions and MIPs were obtained to evaluate the vascular anatomy. CONTRAST:  135m ISOVUE-370 IOPAMIDOL (ISOVUE-370) INJECTION 76% COMPARISON:  Chest x-ray October 16, 2017.  Chest CT Jul 05, 2009. FINDINGS: Cardiovascular: The heart is normal. The thoracic aorta is nonaneurysmal with no dissection or atherosclerosis. Evaluation of pulmonary arteries is limited due to respiratory motion and timing of contrast. However, the central pulmonary arteries are well assessed with no central filling defects noted. Mediastinum/Nodes: The thyroid and esophagus are normal. Shotty nodes in the mediastinum are likely reactive without gross adenopathy. The chest wall is unremarkable. Lungs/Pleura: Central airways are normal. No pneumothorax. Patchy bilateral pulmonary infiltrates involving all lobes. While there are some regions of consolidation, there are large ground-glass components. No nodules or masses noted. Upper Abdomen: No acute abnormality. Musculoskeletal: No chest wall abnormality. No acute or significant osseous  findings. Review of the MIP images confirms the above findings. IMPRESSION: 1. Patchy bilateral pulmonary infiltrates with large ground-glass components and smaller regions of denser consolidation. A multifocal pneumonia is possible. The large ground-glass components raise the possibility of an atypical or opportunistic infection. However, there are non infectious causes as well including but not limited to acute eosinophilic pneumonia, acute interstitial pneumonia, and hypersensitivity pneumonias. Edema is a possibility but cardiogenic edema would be considered less likely given patient age, stage of pregnancy, and lack of cardiomegaly. 2. Evaluation for pulmonary emboli is limited due to respiratory motion and timing of the contrast bolus. However, the central pulmonary arteries are well seen with no emboli. The study was not repeated due to the patient's pregnant status. If high clinical concern remains for distal pulmonary emboli, the study could be repeated. Electronically Signed   By: DDorise BullionIII M.D   On: 10/16/2017 19:23    Pending Labs Unresulted Labs (From admission, onward)    Start     Ordered   10/23/17 0500  Creatinine, serum  (enoxaparin (LOVENOX)    CrCl >/= 30 ml/min)  Weekly,   R    Comments:  while on enoxaparin therapy    10/16/17 2051   10/17/17 0500  HIV antibody (Routine Testing)  Tomorrow morning,   R     10/16/17 2051   10/17/17 02122 Basic metabolic panel  Tomorrow morning,   R     10/16/17 2051   10/17/17 0500  CBC WITH DIFFERENTIAL  Tomorrow morning,   R     10/16/17 2051  10/16/17 2107  Urine rapid drug screen (hosp performed)  STAT,   R     10/16/17 2111   10/16/17 2107  Urinalysis, Routine w reflex microscopic  Once,   R     10/16/17 2111   10/16/17 2051  Legionella Pneumophila Serogp 1 Ur Ag  Once,   R     10/16/17 2051   10/16/17 2049  Culture, sputum-assessment  Once,   R     10/16/17 2051   10/16/17 2049  Gram stain  Once,   R     10/16/17 2051    10/16/17 2049  Strep pneumoniae urinary antigen  Once,   R     10/16/17 2051   10/16/17 1710  Culture, blood (Routine X 2) w Reflex to ID Panel  BLOOD CULTURE X 2,   R     10/16/17 1710          Vitals/Pain Today's Vitals   10/16/17 1508 10/16/17 1537 10/16/17 1551 10/16/17 1803  BP:   (!) 164/118 139/83  Pulse: (!) 107  (!) 107 (!) 108  Resp:   (!) 29 (!) 24  Temp:      TempSrc:      SpO2: (!) 88%  92% 92%  PainSc:  10-Worst pain ever      Isolation Precautions No active isolations  Medications Medications  vancomycin (VANCOCIN) 1,500 mg in sodium chloride 0.9 % 500 mL IVPB (1,500 mg Intravenous New Bag/Given 10/16/17 1931)  iopamidol (ISOVUE-370) 76 % injection (has no administration in time range)  enoxaparin (LOVENOX) injection 40 mg (has no administration in time range)  sodium chloride flush (NS) 0.9 % injection 3 mL (has no administration in time range)  0.9 % NaCl with KCl 20 mEq/ L  infusion (has no administration in time range)  acetaminophen (TYLENOL) tablet 650-975 mg (has no administration in time range)    Or  acetaminophen (TYLENOL) suppository 650 mg (has no administration in time range)  ondansetron (ZOFRAN) tablet 4 mg (has no administration in time range)    Or  ondansetron (ZOFRAN) injection 4 mg (has no administration in time range)  ceFEPIme (MAXIPIME) 1 g in sodium chloride 0.9 % 100 mL IVPB (has no administration in time range)  albuterol (PROVENTIL) (2.5 MG/3ML) 0.083% nebulizer solution 2.5 mg (has no administration in time range)  vancomycin (VANCOCIN) 1,250 mg in sodium chloride 0.9 % 250 mL IVPB (has no administration in time range)  albuterol (PROVENTIL) (2.5 MG/3ML) 0.083% nebulizer solution 5 mg (5 mg Nebulization Given 10/16/17 1604)  ipratropium-albuterol (DUONEB) 0.5-2.5 (3) MG/3ML nebulizer solution 3 mL (3 mLs Nebulization Given 10/16/17 1604)  methylPREDNISolone sodium succinate (SOLU-MEDROL) 125 mg/2 mL injection 125 mg (125 mg Intravenous  Given 10/16/17 1624)  ceFEPIme (MAXIPIME) 2 g in sodium chloride 0.9 % 100 mL IVPB (0 g Intravenous Stopped 10/16/17 1841)  sodium chloride 0.9 % bolus 500 mL (0 mLs Intravenous Stopped 10/16/17 1840)  iopamidol (ISOVUE-370) 76 % injection 100 mL (100 mLs Intravenous Contrast Given 10/16/17 1847)  albuterol (PROVENTIL) (2.5 MG/3ML) 0.083% nebulizer solution 5 mg (5 mg Nebulization Given 10/16/17 1937)    Mobility Walks with oxygen

## 2017-10-17 ENCOUNTER — Other Ambulatory Visit: Payer: Self-pay

## 2017-10-17 DIAGNOSIS — A419 Sepsis, unspecified organism: Principal | ICD-10-CM

## 2017-10-17 LAB — URINALYSIS, ROUTINE W REFLEX MICROSCOPIC
BILIRUBIN URINE: NEGATIVE
Bacteria, UA: NONE SEEN
Glucose, UA: 50 mg/dL — AB
Hgb urine dipstick: NEGATIVE
KETONES UR: 20 mg/dL — AB
Nitrite: NEGATIVE
PROTEIN: NEGATIVE mg/dL
Specific Gravity, Urine: 1.029 (ref 1.005–1.030)
pH: 5 (ref 5.0–8.0)

## 2017-10-17 LAB — CBC WITH DIFFERENTIAL/PLATELET
BASOS ABS: 0 10*3/uL (ref 0.0–0.1)
Basophils Relative: 0 %
EOS ABS: 0 10*3/uL (ref 0.0–0.7)
Eosinophils Relative: 0 %
HEMATOCRIT: 37.8 % (ref 36.0–46.0)
Hemoglobin: 12.8 g/dL (ref 12.0–15.0)
LYMPHS ABS: 1.6 10*3/uL (ref 0.7–4.0)
Lymphocytes Relative: 6 %
MCH: 32.3 pg (ref 26.0–34.0)
MCHC: 33.9 g/dL (ref 30.0–36.0)
MCV: 95.5 fL (ref 78.0–100.0)
MONOS PCT: 3 %
Monocytes Absolute: 0.8 10*3/uL (ref 0.1–1.0)
Neutro Abs: 24.7 10*3/uL — ABNORMAL HIGH (ref 1.7–7.7)
Neutrophils Relative %: 91 %
PLATELETS: 266 10*3/uL (ref 150–400)
RBC: 3.96 MIL/uL (ref 3.87–5.11)
RDW: 13 % (ref 11.5–15.5)
WBC: 27.1 10*3/uL — AB (ref 4.0–10.5)

## 2017-10-17 LAB — BLOOD GAS, ARTERIAL
ACID-BASE DEFICIT: 2.5 mmol/L — AB (ref 0.0–2.0)
Bicarbonate: 22 mmol/L (ref 20.0–28.0)
Drawn by: 295031
FIO2: 100
O2 Saturation: 97.5 %
PCO2 ART: 39.6 mmHg (ref 32.0–48.0)
Patient temperature: 98.6
pH, Arterial: 7.365 (ref 7.350–7.450)
pO2, Arterial: 104 mmHg (ref 83.0–108.0)

## 2017-10-17 LAB — HIV ANTIBODY (ROUTINE TESTING W REFLEX): HIV Screen 4th Generation wRfx: NONREACTIVE

## 2017-10-17 LAB — BASIC METABOLIC PANEL
ANION GAP: 8 (ref 5–15)
BUN: 6 mg/dL (ref 6–20)
CO2: 25 mmol/L (ref 22–32)
Calcium: 8.8 mg/dL — ABNORMAL LOW (ref 8.9–10.3)
Chloride: 106 mmol/L (ref 98–111)
Creatinine, Ser: 0.45 mg/dL (ref 0.44–1.00)
GFR calc Af Amer: >60 mL/min (ref >60)
Glucose, Bld: 134 mg/dL — ABNORMAL HIGH (ref 70–99)
POTASSIUM: 3.8 mmol/L (ref 3.5–5.1)
SODIUM: 139 mmol/L (ref 135–145)

## 2017-10-17 LAB — RAPID URINE DRUG SCREEN, HOSP PERFORMED
Amphetamines: NOT DETECTED
BARBITURATES: NOT DETECTED
BENZODIAZEPINES: NOT DETECTED
COCAINE: NOT DETECTED
OPIATES: NOT DETECTED
Tetrahydrocannabinol: NOT DETECTED

## 2017-10-17 LAB — STREP PNEUMONIAE URINARY ANTIGEN: STREP PNEUMO URINARY ANTIGEN: NEGATIVE

## 2017-10-17 LAB — MRSA PCR SCREENING: MRSA by PCR: POSITIVE — AB

## 2017-10-17 MED ORDER — ZOLPIDEM TARTRATE 5 MG PO TABS
5.0000 mg | ORAL_TABLET | Freq: Once | ORAL | Status: AC
  Administered 2017-10-17: 5 mg via ORAL
  Filled 2017-10-17: qty 1

## 2017-10-17 MED ORDER — NICOTINE 21 MG/24HR TD PT24
21.0000 mg | MEDICATED_PATCH | Freq: Every day | TRANSDERMAL | Status: DC
  Administered 2017-10-17 – 2017-10-18 (×3): 21 mg via TRANSDERMAL
  Filled 2017-10-17 (×3): qty 1

## 2017-10-17 MED ORDER — IPRATROPIUM-ALBUTEROL 0.5-2.5 (3) MG/3ML IN SOLN
3.0000 mL | Freq: Once | RESPIRATORY_TRACT | Status: AC
  Administered 2017-10-17: 3 mL via RESPIRATORY_TRACT
  Filled 2017-10-17: qty 3

## 2017-10-17 MED ORDER — MENTHOL 3 MG MT LOZG
1.0000 | LOZENGE | OROMUCOSAL | Status: DC | PRN
  Filled 2017-10-17: qty 9

## 2017-10-17 MED ORDER — LORAZEPAM 2 MG/ML IJ SOLN
1.0000 mg | Freq: Three times a day (TID) | INTRAMUSCULAR | Status: DC | PRN
  Administered 2017-10-17 – 2017-10-18 (×3): 1 mg via INTRAVENOUS
  Filled 2017-10-17 (×3): qty 1

## 2017-10-17 MED ORDER — HYDROCODONE-HOMATROPINE 5-1.5 MG/5ML PO SYRP
5.0000 mL | ORAL_SOLUTION | Freq: Four times a day (QID) | ORAL | Status: DC | PRN
  Administered 2017-10-17 – 2017-10-18 (×4): 5 mL via ORAL
  Filled 2017-10-17 (×4): qty 5

## 2017-10-17 MED ORDER — PHENOL 1.4 % MT LIQD
1.0000 | OROMUCOSAL | Status: DC | PRN
  Filled 2017-10-17: qty 177

## 2017-10-17 MED ORDER — ORAL CARE MOUTH RINSE
15.0000 mL | Freq: Two times a day (BID) | OROMUCOSAL | Status: DC
  Administered 2017-10-19: 15 mL via OROMUCOSAL

## 2017-10-17 MED ORDER — GUAIFENESIN ER 600 MG PO TB12
600.0000 mg | ORAL_TABLET | Freq: Two times a day (BID) | ORAL | Status: DC | PRN
  Administered 2017-10-17: 600 mg via ORAL
  Filled 2017-10-17: qty 1

## 2017-10-17 MED ORDER — METHYLPREDNISOLONE SODIUM SUCC 125 MG IJ SOLR
60.0000 mg | Freq: Once | INTRAMUSCULAR | Status: AC
  Administered 2017-10-17: 60 mg via INTRAVENOUS
  Filled 2017-10-17: qty 2

## 2017-10-17 MED ORDER — CHLORHEXIDINE GLUCONATE 0.12 % MT SOLN
15.0000 mL | Freq: Two times a day (BID) | OROMUCOSAL | Status: DC
  Administered 2017-10-18 – 2017-10-19 (×2): 15 mL via OROMUCOSAL
  Filled 2017-10-17: qty 15

## 2017-10-17 MED ORDER — LORAZEPAM 1 MG PO TABS: 1.0000 mg | ORAL_TABLET | Freq: Three times a day (TID) | ORAL | Status: DC | PRN

## 2017-10-17 NOTE — Progress Notes (Addendum)
PROGRESS NOTE    Catherine Cannon  HOZ:[id number] DOB: [birth year] DOA: 10/16/2017 PCP: Patient, No Pcp Per   Brief Narrative: Catherine Cannon is a 37 y.o. female with a history of tobacco use presents secondary to shortness of breath and found to have multifocal pneumonia and acute respiratory failure. She is also pregnant. CTA negative for PE. Started on empiric vancomycin and cefepime.   Assessment & Plan:   Principal Problem:   Multifocal pneumonia Active Problems:   Sepsis ([place])   Pregnancy   Acute respiratory failure with hypoxia ([place])   Multifocal pneumonia Patient started on Vancomycin and Cefepime empirically. Blood cultures pending. Urine strep antigen pending. Sputum culture/gram stain pending. MRSA pcr positive. Patient without significant vaping history. Currently afebrile with significantly elevated WBC in setting of infection and steroid use. -Continue Vancomycin/cefepime -Follow culture data -Hycodan prn for coughing  Sepsis Secondary to pneumonia. Present on admission.  Acute respiratory failure with hypoxia Secondary to pneumonia. Worsened. Oxygen saturations in low 90s to high 80s on partial non-rebreather -Will start Bipap  Pregnancy Elevated B-hcg. Per LMP, GA of 13w 4d with an EDD of 04/21/2019; this is all if LMP is correct. I have consulted OB/gyn faculty practice for help with medication decisions for safety. She will need OB/gyn follow-up at discharge.  Tobacco abuse Counseled. Patient states that she will stop smoking. This will be good, especially in light of pregnancy   DVT prophylaxis: Lovenox Code Status:   Code Status: Full Code Family Communication: None at bedside Disposition Plan: Discharge pending medical improvement   Consultants:   OB/gyn  Procedures:   None  Antimicrobials:  Vancomycin  Cefepime    Subjective: Dyspnea. Pleuritic chest pain. Some blood tinged sputum while coughing.   Objective: Vitals:   10/17/17 0558  10/17/17 0733 10/17/17 0800 10/17/17 0900  BP:      Pulse:  (!) 109  (!) 114  Resp:  18  (!) 27  Temp:   98.6 F (37 C)   TempSrc:   Oral   SpO2: 93% (!) 87%  90%    Intake/Output Summary (Last 24 hours) at 10/17/2017 7048 Last data filed at 10/17/2017 0400 Gross per 24 hour  Intake 600 ml  Output 850 ml  Net -250 ml   There were no vitals filed for this visit.  Examination:  General exam: Appears calm and mildly distressed Respiratory system: diffusely diminished with rales. No wheezing. Mild respiratory distress Cardiovascular system: S1 & S2 heard, RRR. No murmurs, rubs, gallops or clicks. Gastrointestinal system: Abdomen is nondistended, soft and nontender. No organomegaly or masses felt. Normal bowel sounds heard. Central nervous system: Alert and oriented. No focal neurological deficits. Extremities: No edema. No calf tenderness Skin: No cyanosis. No rashes Psychiatry: Judgement and insight appear normal. Mood & affect appropriate.     Data Reviewed: I have personally reviewed following labs and imaging studies  CBC: Recent Labs  Lab 10/12/17 0759 10/16/17 1629 10/17/17 0407  WBC 15.3* 24.7* 27.1*  NEUTROABS  --  20.9* 24.7*  HGB 13.3 13.5 12.8  HCT 38.8 39.1 37.8  MCV 95.1 94.2 95.5  PLT 250 272 889   Basic Metabolic Panel: Recent Labs  Lab 10/12/17 0759 10/16/17 1629 10/17/17 0407  NA 134* 139 139  K 3.7 3.4* 3.8  CL 102 103 106  CO2 22 25 25   GLUCOSE 105* 102* 134*  BUN <5* <5* 6  CREATININE 0.55 0.46 0.45  CALCIUM 9.1 9.4 8.8*   GFR: Estimated Creatinine Clearance: 112.8  mL/min (by C-G formula based on SCr of 0.45 mg/dL). Liver Function Tests: Recent Labs  Lab 10/16/17 1629  AST 33  ALT 24  ALKPHOS 75  BILITOT 0.6  PROT 7.3  ALBUMIN 3.5   Recent Labs  Lab 10/16/17 1629  LIPASE 19   No results for input(s): AMMONIA in the last 168 hours. Coagulation Profile: No results for input(s): INR, PROTIME in the last 168 hours. Cardiac  Enzymes: No results for input(s): CKTOTAL, CKMB, CKMBINDEX, TROPONINI in the last 168 hours. BNP (last 3 results) No results for input(s): PROBNP in the last 8760 hours. HbA1C: No results for input(s): HGBA1C in the last 72 hours. CBG: No results for input(s): GLUCAP in the last 168 hours. Lipid Profile: No results for input(s): CHOL, HDL, LDLCALC, TRIG, CHOLHDL, LDLDIRECT in the last 72 hours. Thyroid Function Tests: No results for input(s): TSH, T4TOTAL, FREET4, T3FREE, THYROIDAB in the last 72 hours. Anemia Panel: No results for input(s): VITAMINB12, FOLATE, FERRITIN, TIBC, IRON, RETICCTPCT in the last 72 hours. Sepsis Labs: Recent Labs  Lab 10/16/17 1754  LATICACIDVEN 0.94    Recent Results (from the past 240 hour(s))  MRSA PCR Screening     Status: Abnormal   Collection Time: 10/16/17 10:17 PM  Result Value Ref Range Status   MRSA by PCR POSITIVE (A) NEGATIVE Final    Comment:        The GeneXpert MRSA Assay (FDA approved for NASAL specimens only), is one component of a comprehensive MRSA colonization surveillance program. It is not intended to diagnose MRSA infection nor to guide or monitor treatment for MRSA infections. RESULT CALLED TO, READ BACK BY AND VERIFIED WITH: C,SIMMONS AT 0126 ON 10/17/17 BY A,MOHAMED Performed at [facility], [place] [address], [place], [place] [address]          Radiology Studies: Dg Chest 2 View  Result Date: 10/16/2017 CLINICAL DATA:  37 year old with shortness of breath and cough. EXAM: CHEST - 2 VIEW COMPARISON:  10/12/2017 FINDINGS: Scattered lung opacities throughout both lungs. Evidence for airspace disease in the right upper lung with patchy and possibly nodular densities at the right lung base. Patchy densities throughout the mid and lower left lung. Heart and mediastinum are within normal limits. Trachea is midline. No large pleural effusions. IMPRESSION: Bilateral lung disease. Findings are suggestive for  multifocal pneumonia. Followup PA and lateral chest X-ray is recommended in 3-4 weeks following trial of antibiotic therapy to ensure resolution and exclude underlying malignancy. Electronically Signed   By: Markus Daft M.D.   On: 10/16/2017 16:04   Ct Angio Chest Pe W/cm &/or Wo Cm  Result Date: 10/16/2017 CLINICAL DATA:  Shortness of breath. EXAM: CT ANGIOGRAPHY CHEST WITH CONTRAST TECHNIQUE: Multidetector CT imaging of the chest was performed using the standard protocol during bolus administration of intravenous contrast. Multiplanar CT image reconstructions and MIPs were obtained to evaluate the vascular anatomy. CONTRAST:  124mL ISOVUE-370 IOPAMIDOL (ISOVUE-370) INJECTION 76% COMPARISON:  Chest x-ray October 16, 2017.  Chest CT Jul 05, 2009. FINDINGS: Cardiovascular: The heart is normal. The thoracic aorta is nonaneurysmal with no dissection or atherosclerosis. Evaluation of pulmonary arteries is limited due to respiratory motion and timing of contrast. However, the central pulmonary arteries are well assessed with no central filling defects noted. Mediastinum/Nodes: The thyroid and esophagus are normal. Shotty nodes in the mediastinum are likely reactive without gross adenopathy. The chest wall is unremarkable. Lungs/Pleura: Central airways are normal. No pneumothorax. Patchy bilateral pulmonary infiltrates involving all  lobes. While there are some regions of consolidation, there are large ground-glass components. No nodules or masses noted. Upper Abdomen: No acute abnormality. Musculoskeletal: No chest wall abnormality. No acute or significant osseous findings. Review of the MIP images confirms the above findings. IMPRESSION: 1. Patchy bilateral pulmonary infiltrates with large ground-glass components and smaller regions of denser consolidation. A multifocal pneumonia is possible. The large ground-glass components raise the possibility of an atypical or opportunistic infection. However, there are non  infectious causes as well including but not limited to acute eosinophilic pneumonia, acute interstitial pneumonia, and hypersensitivity pneumonias. Edema is a possibility but cardiogenic edema would be considered less likely given patient age, stage of pregnancy, and lack of cardiomegaly. 2. Evaluation for pulmonary emboli is limited due to respiratory motion and timing of the contrast bolus. However, the central pulmonary arteries are well seen with no emboli. The study was not repeated due to the patient's pregnant status. If high clinical concern remains for distal pulmonary emboli, the study could be repeated. Electronically Signed   By: Dorise Bullion III M.D   On: 10/16/2017 19:23        Scheduled Meds: . enoxaparin (LOVENOX) injection  40 mg Subcutaneous QHS  . nicotine  21 mg Transdermal Daily  . sodium chloride flush  3 mL Intravenous Q12H   Continuous Infusions: . ceFEPime (MAXIPIME) IV Stopped (10/17/17 0129)  . vancomycin 1,250 mg (10/17/17 0808)     LOS: 1 day     Cordelia Poche, MD [facility] 10/17/2017, 9:22 AM Pager: [phone]  If 7PM-7AM, please contact night-coverage [email or web address] 10/17/2017, 9:22 AM

## 2017-10-17 NOTE — Progress Notes (Signed)
Pt admitted from ED on 5l [place],had  a coughing spell last night. Was placed on Venti mask and then nonrebreather to help maintain 02 in the 90's. Rt aware Mucinex 600mg , Nicotine patch placed, Albuterol nebs given. I will continue to monitor.

## 2017-10-18 ENCOUNTER — Inpatient Hospital Stay (HOSPITAL_COMMUNITY): Payer: Medicaid Other

## 2017-10-18 DIAGNOSIS — J189 Pneumonia, unspecified organism: Secondary | ICD-10-CM

## 2017-10-18 DIAGNOSIS — J9600 Acute respiratory failure, unspecified whether with hypoxia or hypercapnia: Secondary | ICD-10-CM

## 2017-10-18 DIAGNOSIS — J80 Acute respiratory distress syndrome: Secondary | ICD-10-CM

## 2017-10-18 LAB — RESPIRATORY PANEL BY PCR
Adenovirus: NOT DETECTED
BORDETELLA PERTUSSIS-RVPCR: NOT DETECTED
CHLAMYDOPHILA PNEUMONIAE-RVPPCR: NOT DETECTED
CORONAVIRUS 229E-RVPPCR: NOT DETECTED
CORONAVIRUS HKU1-RVPPCR: NOT DETECTED
Coronavirus NL63: NOT DETECTED
Coronavirus OC43: NOT DETECTED
Influenza A: NOT DETECTED
Influenza B: NOT DETECTED
MYCOPLASMA PNEUMONIAE-RVPPCR: NOT DETECTED
Metapneumovirus: NOT DETECTED
PARAINFLUENZA VIRUS 2-RVPPCR: NOT DETECTED
Parainfluenza Virus 1: NOT DETECTED
Parainfluenza Virus 3: NOT DETECTED
Parainfluenza Virus 4: NOT DETECTED
Respiratory Syncytial Virus: NOT DETECTED
Rhinovirus / Enterovirus: DETECTED — AB

## 2017-10-18 LAB — BASIC METABOLIC PANEL
Anion gap: 10 (ref 5–15)
BUN: 6 mg/dL (ref 6–20)
CHLORIDE: 104 mmol/L (ref 98–111)
CO2: 26 mmol/L (ref 22–32)
CREATININE: 0.35 mg/dL — AB (ref 0.44–1.00)
Calcium: 8.8 mg/dL — ABNORMAL LOW (ref 8.9–10.3)
GFR calc non Af Amer: >60 mL/min (ref >60)
GLUCOSE: 112 mg/dL — AB (ref 70–99)
Potassium: 3.5 mmol/L (ref 3.5–5.1)
Sodium: 140 mmol/L (ref 135–145)

## 2017-10-18 LAB — BLOOD GAS, ARTERIAL
ACID-BASE EXCESS: 1.3 mmol/L (ref 0.0–2.0)
Acid-Base Excess: 1.9 mmol/L (ref 0.0–2.0)
Bicarbonate: 29.5 mmol/L — ABNORMAL HIGH (ref 20.0–28.0)
Bicarbonate: 29.4 mmol/L — ABNORMAL HIGH (ref 20.0–28.0)
DRAWN BY: 422461
Drawn by: 422461
FIO2: 100
FIO2: 100
O2 SAT: 96.2 %
O2 Saturation: 97 %
PATIENT TEMPERATURE: 37.8
PCO2 ART: 71.7 mmHg — AB (ref 32.0–48.0)
PEEP/CPAP: 16 cmH2O
PEEP: 16 cmH2O
PH ART: 7.244 — AB (ref 7.350–7.450)
PH ART: 7.269 — AB (ref 7.350–7.450)
Patient temperature: 37.9
RATE: 30 resp/min
RATE: 35 resp/min
VT: 400 mL
VT: 400 mL
pCO2 arterial: 67.2 mmHg (ref 32.0–48.0)
pO2, Arterial: 97.3 mmHg (ref 83.0–108.0)
pO2, Arterial: 100 mmHg (ref 83.0–108.0)

## 2017-10-18 LAB — LEGIONELLA PNEUMOPHILA SEROGP 1 UR AG: L. pneumophila Serogp 1 Ur Ag: NEGATIVE

## 2017-10-18 LAB — TRIGLYCERIDES: Triglycerides: 815 mg/dL — ABNORMAL HIGH (ref <150)

## 2017-10-18 LAB — EXPECTORATED SPUTUM ASSESSMENT W REFEX TO RESP CULTURE

## 2017-10-18 LAB — CBC
HCT: 36.8 % (ref 36.0–46.0)
Hemoglobin: 12.3 g/dL (ref 12.0–15.0)
MCH: 32.7 pg (ref 26.0–34.0)
MCHC: 33.4 g/dL (ref 30.0–36.0)
MCV: 97.9 fL (ref 78.0–100.0)
PLATELETS: 252 10*3/uL (ref 150–400)
RBC: 3.76 MIL/uL — AB (ref 3.87–5.11)
RDW: 13.2 % (ref 11.5–15.5)
WBC: 20.9 10*3/uL — ABNORMAL HIGH (ref 4.0–10.5)

## 2017-10-18 LAB — EXPECTORATED SPUTUM ASSESSMENT W GRAM STAIN, RFLX TO RESP C

## 2017-10-18 LAB — BRAIN NATRIURETIC PEPTIDE: B NATRIURETIC PEPTIDE 5: 64.1 pg/mL (ref 0.0–100.0)

## 2017-10-18 LAB — LACTIC ACID, PLASMA: LACTIC ACID, VENOUS: 0.9 mmol/L (ref 0.5–1.9)

## 2017-10-18 MED ORDER — FENTANYL BOLUS VIA INFUSION
50.0000 ug | INTRAVENOUS | Status: DC | PRN
  Filled 2017-10-18: qty 50

## 2017-10-18 MED ORDER — CHLORHEXIDINE GLUCONATE CLOTH 2 % EX PADS
6.0000 | MEDICATED_PAD | Freq: Every day | CUTANEOUS | Status: DC
  Administered 2017-10-18 – 2017-10-19 (×2): 6 via TOPICAL

## 2017-10-18 MED ORDER — FENTANYL 2500MCG IN NS 250ML (10MCG/ML) PREMIX INFUSION
25.0000 ug/h | INTRAVENOUS | Status: DC
  Administered 2017-10-18: 300 ug/h via INTRAVENOUS
  Administered 2017-10-18 – 2017-10-19 (×2): 350 ug/h via INTRAVENOUS
  Administered 2017-10-19: 400 ug/h via INTRAVENOUS
  Filled 2017-10-18 (×4): qty 250

## 2017-10-18 MED ORDER — GUAIFENESIN ER 600 MG PO TB12
600.0000 mg | ORAL_TABLET | Freq: Two times a day (BID) | ORAL | Status: DC | PRN
  Administered 2017-10-18: 600 mg via ORAL
  Filled 2017-10-18: qty 1

## 2017-10-18 MED ORDER — FENTANYL CITRATE (PF) 100 MCG/2ML IJ SOLN
50.0000 ug | Freq: Once | INTRAMUSCULAR | Status: AC
  Administered 2017-10-18: 50 ug via INTRAVENOUS

## 2017-10-18 MED ORDER — ARTIFICIAL TEARS OPHTHALMIC OINT
1.0000 "application " | TOPICAL_OINTMENT | Freq: Three times a day (TID) | OPHTHALMIC | Status: DC
  Administered 2017-10-18 – 2017-10-19 (×3): 1 via OPHTHALMIC
  Filled 2017-10-18: qty 3.5

## 2017-10-18 MED ORDER — MIDAZOLAM HCL 2 MG/2ML IJ SOLN
2.0000 mg | INTRAMUSCULAR | Status: DC | PRN
  Administered 2017-10-18 (×2): 2 mg via INTRAVENOUS

## 2017-10-18 MED ORDER — NOREPINEPHRINE 4 MG/250ML-% IV SOLN
0.0000 ug/min | INTRAVENOUS | Status: DC
  Filled 2017-10-18: qty 250

## 2017-10-18 MED ORDER — FENTANYL CITRATE (PF) 100 MCG/2ML IJ SOLN
INTRAMUSCULAR | Status: AC
  Administered 2017-10-18: 16:00:00
  Filled 2017-10-18: qty 2

## 2017-10-18 MED ORDER — SODIUM CHLORIDE 0.9 % IV SOLN
3.0000 ug/kg/min | INTRAVENOUS | Status: DC
  Administered 2017-10-18: 3 ug/kg/min via INTRAVENOUS
  Administered 2017-10-19: 2.5 ug/kg/min via INTRAVENOUS
  Filled 2017-10-18 (×3): qty 20

## 2017-10-18 MED ORDER — SODIUM CHLORIDE 0.9 % IV SOLN: INTRAVENOUS | Status: DC | PRN

## 2017-10-18 MED ORDER — HYDROCODONE-HOMATROPINE 5-1.5 MG/5ML PO SYRP
5.0000 mL | ORAL_SOLUTION | Freq: Four times a day (QID) | ORAL | Status: DC | PRN
  Administered 2017-10-18: 5 mL via ORAL
  Filled 2017-10-18: qty 5

## 2017-10-18 MED ORDER — MUPIROCIN 2 % EX OINT
1.0000 "application " | TOPICAL_OINTMENT | Freq: Two times a day (BID) | CUTANEOUS | Status: DC
  Administered 2017-10-18 – 2017-10-19 (×2): 1 via NASAL
  Filled 2017-10-18: qty 22

## 2017-10-18 MED ORDER — FENTANYL CITRATE (PF) 100 MCG/2ML IJ SOLN
100.0000 ug | Freq: Once | INTRAMUSCULAR | Status: DC
  Administered 2017-10-18: 100 ug via INTRAVENOUS
  Filled 2017-10-18: qty 2

## 2017-10-18 MED ORDER — POTASSIUM CHLORIDE 10 MEQ/100ML IV SOLN
10.0000 meq | INTRAVENOUS | Status: AC
  Administered 2017-10-18 (×3): 10 meq via INTRAVENOUS
  Filled 2017-10-18 (×3): qty 100

## 2017-10-18 MED ORDER — FENTANYL 2500MCG IN NS 250ML (10MCG/ML) PREMIX INFUSION
100.0000 ug/h | INTRAVENOUS | Status: DC
  Administered 2017-10-18: 100 ug/h via INTRAVENOUS
  Filled 2017-10-18: qty 250

## 2017-10-18 MED ORDER — SODIUM CHLORIDE 0.9 % IV SOLN
500.0000 mg | INTRAVENOUS | Status: DC
  Administered 2017-10-18 – 2017-10-19 (×2): 500 mg via INTRAVENOUS
  Filled 2017-10-18 (×2): qty 500

## 2017-10-18 MED ORDER — ETOMIDATE 2 MG/ML IV SOLN: 40.0000 mg | Freq: Once | INTRAVENOUS | Status: DC

## 2017-10-18 MED ORDER — PROPOFOL 1000 MG/100ML IV EMUL
25.0000 ug/kg/min | INTRAVENOUS | Status: DC
  Administered 2017-10-18: 80 ug/kg/min via INTRAVENOUS
  Filled 2017-10-18: qty 100

## 2017-10-18 MED ORDER — FUROSEMIDE 10 MG/ML IJ SOLN
INTRAMUSCULAR | Status: AC
  Filled 2017-10-18: qty 4

## 2017-10-18 MED ORDER — FUROSEMIDE 10 MG/ML IJ SOLN
40.0000 mg | INTRAMUSCULAR | Status: AC
  Administered 2017-10-18: 40 mg via INTRAVENOUS

## 2017-10-18 MED ORDER — PROPOFOL 1000 MG/100ML IV EMUL
INTRAVENOUS | Status: AC
  Filled 2017-10-18: qty 100

## 2017-10-18 MED ORDER — NOREPINEPHRINE 4 MG/250ML-% IV SOLN
0.0000 ug/min | INTRAVENOUS | Status: DC
  Administered 2017-10-18: 2 ug/min via INTRAVENOUS
  Filled 2017-10-18: qty 250

## 2017-10-18 MED ORDER — FENTANYL CITRATE (PF) 100 MCG/2ML IJ SOLN
100.0000 ug | Freq: Once | INTRAMUSCULAR | Status: DC | PRN
  Administered 2017-10-18: 100 ug via INTRAVENOUS

## 2017-10-18 MED ORDER — PROPOFOL 1000 MG/100ML IV EMUL
0.0000 ug/kg/min | INTRAVENOUS | Status: DC
  Administered 2017-10-18: 70 ug/kg/min via INTRAVENOUS
  Administered 2017-10-18 – 2017-10-19 (×2): 40 ug/kg/min via INTRAVENOUS
  Administered 2017-10-19 (×4): 50 ug/kg/min via INTRAVENOUS
  Filled 2017-10-18 (×6): qty 100

## 2017-10-18 MED ORDER — SUCCINYLCHOLINE CHLORIDE 20 MG/ML IJ SOLN: 20.0000 mg | Freq: Once | INTRAMUSCULAR | Status: DC

## 2017-10-18 MED ORDER — CISATRACURIUM BOLUS VIA INFUSION
0.1000 mg/kg | Freq: Once | INTRAVENOUS | Status: AC
  Administered 2017-10-18: 8.7 mg via INTRAVENOUS
  Filled 2017-10-18: qty 9

## 2017-10-18 MED ORDER — MIDAZOLAM HCL 2 MG/2ML IJ SOLN
INTRAMUSCULAR | Status: AC
  Administered 2017-10-18: 16:00:00
  Filled 2017-10-18: qty 4

## 2017-10-18 NOTE — Progress Notes (Signed)
See MAR of medicine given prior and during intubation.

## 2017-10-18 NOTE — Progress Notes (Signed)
PCCM progress note  Blood pressure (!) 156/71, pulse (!) 102, temperature 99.7 F (37.6 C), temperature source Axillary, resp. rate (!) 40, height 5\' 9"  (1.753 m), weight 87.1 kg, last menstrual period 07/14/2017, SpO2 94 %. Gen:   Moderate respiratory to HEENT:  EOMI, sclera anicteric Neck:     No masses; no thyromegaly Lungs:    Increased work of breathing, use of accessory muscles. CV:         Regular rate and rhythm; no murmurs Abd:      + bowel sounds; soft, non-tender; no palpable masses, no distension Ext:    No edema; adequate peripheral perfusion Skin:      Warm and dry; no rash Neuro: Anxious.  Assessment/plan: Severe ARDS in the setting of community-acquired pneumonia.  Pt with worsening resp status through the day and was intubated She has remained hypoxic post intubation with difficulty sedation, dyssynchronous with the vent. Started propofol, fentanyl at max dose Discussed with pharmacy.  Will use Versed if needed although would like to avoid high doses of benzos with pregnancy Once she is adequately sedated we would start paralysis with Nimbex Consider bronchoscopy when she is stable Follow-up post intubation chest x-ray, ABG. Start low tidal volume ventilation.  Currently at 8 cc/kg.  Once we review the ABG we can lower to 6 cc. Follow plateau pressure.  Discussed with family in detail at bedside  Additional critical care time-40 minutes  The patient is critically ill with multiple organ system failure and requires high complexity decision making for assessment and support, frequent evaluation and titration of therapies, advanced monitoring, review of radiographic studies and interpretation of complex data.   Marshell Garfinkel MD [facility] 10/18/2017, 5:02 PM

## 2017-10-18 NOTE — Care Management Note (Signed)
Case Management Note  Patient Details  Name: Catherine Cannon MRN: [id number] Date of Birth: September 21, 1980  Subjective/Objective:                   37 year old female smoker who pregnant with gestational age of [redacted] weeks 5 days, with estimated due date 04/21/2019. Was initially seen on 8/2 with 1 week history of congestion, and cough that progressed from dry to productive of yellow/green.  She had no fever or shortness of breath .  She was felt at that time to have a viral bronchitis, and was treated with cough suppression and prednisone taper.  She had initially started to improve, then she reported to the emergency room once again on 8/20 with worsening cough, chest discomfort, and some associated shortness of breath.  At this point she was sent home on a azithromycin.   8/24 she once again presented to the emergency room: Continuing to report cough, this is gotten worse.  Now short of breath at rest, having chest pain with cough.  She was found to be tachycardic, tachypneic, and pulse oximetry was in the mid 80s on room air.  Chest x-ray revealed bilateral multifocal airspace disease with a white blood cell count of 24.7.  CT of chest was negative for pulmonary emboli but again verified patchy bilateral airspace disease, she was started on cefepime and vancomycin, IV Solu-Medrol, supplemental oxygen, and admitted to the stepdown unit. 8/25: Still short of breath with pleuritic type chest pain.  Coughing some blood-tinged sputum more escalated to Venturi mask, then nonrebreather 8/26: Clinically feeling worse.  Placed on noninvasive pressure ventilation.  Pulmonary asked to evaluate and assist with care Plan Continuing to rotate noninvasive positive pressure ventilation and will add heated high flow Continued pulse oximetry Lasix x1 Follow-up urine Legionella antigen Send respiratory viral panel Day #3 cefepime and vancomycin Add back azithromycin for atypical coverage Follow-up chest x-ray Check  BNP Treat anxiety PRN bronchodilators Needs to stop smoking If intubated, will try to avoid benzodiazepines, sedation protocol ideally would be fentanyl, propofol w/ nimbex if NMB needed. Etomidate OK for intubation   Action/Plan: Tbd/from home/following for cm needs and medical progression.  Expected Discharge Date:                  Expected Discharge Plan:  Home/Self Care  In-House Referral:     Discharge planning Services  CM Consult  Post Acute Care Choice:    Choice offered to:     DME Arranged:    DME Agency:     HH Arranged:    HH Agency:     Status of Service:  In process, will continue to follow  If discussed at Long Length of Stay Meetings, dates discussed:    Additional Comments:  Leeroy Cha, RN 10/18/2017, 10:21 AM

## 2017-10-18 NOTE — Progress Notes (Signed)
PROGRESS NOTE    Catherine Cannon  LAG:[id number] DOB: 23-Jun-1980 DOA: 10/16/2017 PCP: Patient, No Pcp Per   Brief Narrative: Catherine Cannon is a 37 y.o. female with a history of tobacco use presents secondary to shortness of breath and found to have multifocal pneumonia and acute respiratory failure. She is also pregnant. CTA negative for PE. Started on empiric vancomycin and cefepime.   Assessment & Plan:   Principal Problem:   Multifocal pneumonia Active Problems:   Sepsis ([place])   Pregnancy   Acute respiratory failure with hypoxia ([place])   Multifocal pneumonia Patient started on Vancomycin and Cefepime empirically. Blood cultures pending. Urine strep antigen pending. Sputum culture/gram stain pending. MRSA pcr positive. Patient without significant vaping history. Currently afebrile. WBC trended down. -Continue Vancomycin/cefepime -Azithromycin added -RVP pending -Follow culture data -Hycodan prn for coughing  Sepsis Secondary to pneumonia. Present on admission.  Acute respiratory failure with hypoxia Secondary to pneumonia. Subjectively worsened even on Bipap. Patient with significant hypoxia off of Bipap on non-rebreather. Concern for patient heading towards need for intubation -Critical care consulted -Continue Bipap  Pregnancy Elevated B-hcg. Per LMP, GA of 13w 4d with an EDD of 04/21/2019; this is all if LMP is correct. I have consulted OB/gyn faculty practice for help with medication decisions for safety. She will need OB/gyn follow-up at discharge. -OB/gyn recommendations: Pelvic ultrasound when able to assess for viability  Tobacco abuse Counseled. Patient states that she will stop smoking. This will be good, especially in light of pregnancy   DVT prophylaxis: Lovenox Code Status:   Code Status: Full Code Family Communication: None at bedside Disposition Plan: Discharge pending medical improvement   Consultants:   OB/gyn  PCCM  Procedures:   (8/25>> ):  BiPAP  Antimicrobials:  Vancomycin  Cefepime   Azithromycin   Subjective: Worsening dyspnea.   Objective: Vitals:   10/18/17 0400 10/18/17 0408 10/18/17 0600 10/18/17 0755  BP: (!) 143/71  139/61   Pulse: (!) 105  100 (!) 102  Resp: (!) 33  (!) 29   Temp:  98.3 F (36.8 C)    TempSrc:  Axillary    SpO2: 94%  95% 95%  Weight:      Height:        Intake/Output Summary (Last 24 hours) at 10/18/2017 0814 Last data filed at 10/18/2017 0228 Gross per 24 hour  Intake 1035.74 ml  Output 1350 ml  Net -314.26 ml   Filed Weights   10/17/17 1800  Weight: 87.1 kg    Examination:  General exam: Appears calm and comfortable Respiratory system: Diffuse wheeze with diminished breath sounds and rales. Respiratory effort increased. Cardiovascular system: S1 & S2 heard, RRR. No murmurs. Gastrointestinal system: Abdomen is nondistended, soft and nontender. No organomegaly or masses felt. Normal bowel sounds heard. Central nervous system: Alert and oriented. No focal neurological deficits. Extremities: No edema. No calf tenderness Skin: No cyanosis. No rashes Psychiatry: Judgement and insight appear normal. Anxious.    Data Reviewed: I have personally reviewed following labs and imaging studies  CBC: Recent Labs  Lab 10/12/17 0759 10/16/17 1629 10/17/17 0407  WBC 15.3* 24.7* 27.1*  NEUTROABS  --  20.9* 24.7*  HGB 13.3 13.5 12.8  HCT 38.8 39.1 37.8  MCV 95.1 94.2 95.5  PLT 250 272 122   Basic Metabolic Panel: Recent Labs  Lab 10/12/17 0759 10/16/17 1629 10/17/17 0407  NA 134* 139 139  K 3.7 3.4* 3.8  CL 102 103 106  CO2 22 25 25  GLUCOSE 105* 102* 134*  BUN <5* <5* 6  CREATININE 0.55 0.46 0.45  CALCIUM 9.1 9.4 8.8*   GFR: Estimated Creatinine Clearance: 113.4 mL/min (by C-G formula based on SCr of 0.45 mg/dL). Liver Function Tests: Recent Labs  Lab 10/16/17 1629  AST 33  ALT 24  ALKPHOS 75  BILITOT 0.6  PROT 7.3  ALBUMIN 3.5   Recent Labs  Lab  10/16/17 1629  LIPASE 19   No results for input(s): AMMONIA in the last 168 hours. Coagulation Profile: No results for input(s): INR, PROTIME in the last 168 hours. Cardiac Enzymes: No results for input(s): CKTOTAL, CKMB, CKMBINDEX, TROPONINI in the last 168 hours. BNP (last 3 results) No results for input(s): PROBNP in the last 8760 hours. HbA1C: No results for input(s): HGBA1C in the last 72 hours. CBG: No results for input(s): GLUCAP in the last 168 hours. Lipid Profile: No results for input(s): CHOL, HDL, LDLCALC, TRIG, CHOLHDL, LDLDIRECT in the last 72 hours. Thyroid Function Tests: No results for input(s): TSH, T4TOTAL, FREET4, T3FREE, THYROIDAB in the last 72 hours. Anemia Panel: No results for input(s): VITAMINB12, FOLATE, FERRITIN, TIBC, IRON, RETICCTPCT in the last 72 hours. Sepsis Labs: Recent Labs  Lab 10/16/17 1754  LATICACIDVEN 0.94    Recent Results (from the past 240 hour(s))  MRSA PCR Screening     Status: Abnormal   Collection Time: 10/16/17 10:17 PM  Result Value Ref Range Status   MRSA by PCR POSITIVE (A) NEGATIVE Final    Comment:        The GeneXpert MRSA Assay (FDA approved for NASAL specimens only), is one component of a comprehensive MRSA colonization surveillance program. It is not intended to diagnose MRSA infection nor to guide or monitor treatment for MRSA infections. RESULT CALLED TO, READ BACK BY AND VERIFIED WITH: C,SIMMONS AT 0126 ON 10/17/17 BY A,MOHAMED Performed at [facility], [place] [address], [place], [place] [address]          Radiology Studies: Dg Chest 2 View  Result Date: 10/16/2017 CLINICAL DATA:  37 year old with shortness of breath and cough. EXAM: CHEST - 2 VIEW COMPARISON:  10/12/2017 FINDINGS: Scattered lung opacities throughout both lungs. Evidence for airspace disease in the right upper lung with patchy and possibly nodular densities at the right lung base. Patchy densities throughout the mid  and lower left lung. Heart and mediastinum are within normal limits. Trachea is midline. No large pleural effusions. IMPRESSION: Bilateral lung disease. Findings are suggestive for multifocal pneumonia. Followup PA and lateral chest X-ray is recommended in 3-4 weeks following trial of antibiotic therapy to ensure resolution and exclude underlying malignancy. Electronically Signed   By: Markus Daft M.D.   On: 10/16/2017 16:04   Ct Angio Chest Pe W/cm &/or Wo Cm  Result Date: 10/16/2017 CLINICAL DATA:  Shortness of breath. EXAM: CT ANGIOGRAPHY CHEST WITH CONTRAST TECHNIQUE: Multidetector CT imaging of the chest was performed using the standard protocol during bolus administration of intravenous contrast. Multiplanar CT image reconstructions and MIPs were obtained to evaluate the vascular anatomy. CONTRAST:  138mL ISOVUE-370 IOPAMIDOL (ISOVUE-370) INJECTION 76% COMPARISON:  Chest x-ray October 16, 2017.  Chest CT Jul 05, 2009. FINDINGS: Cardiovascular: The heart is normal. The thoracic aorta is nonaneurysmal with no dissection or atherosclerosis. Evaluation of pulmonary arteries is limited due to respiratory motion and timing of contrast. However, the central pulmonary arteries are well assessed with no central filling defects noted. Mediastinum/Nodes: The thyroid and esophagus are normal. Shotty nodes in  the mediastinum are likely reactive without gross adenopathy. The chest wall is unremarkable. Lungs/Pleura: Central airways are normal. No pneumothorax. Patchy bilateral pulmonary infiltrates involving all lobes. While there are some regions of consolidation, there are large ground-glass components. No nodules or masses noted. Upper Abdomen: No acute abnormality. Musculoskeletal: No chest wall abnormality. No acute or significant osseous findings. Review of the MIP images confirms the above findings. IMPRESSION: 1. Patchy bilateral pulmonary infiltrates with large ground-glass components and smaller regions of  denser consolidation. A multifocal pneumonia is possible. The large ground-glass components raise the possibility of an atypical or opportunistic infection. However, there are non infectious causes as well including but not limited to acute eosinophilic pneumonia, acute interstitial pneumonia, and hypersensitivity pneumonias. Edema is a possibility but cardiogenic edema would be considered less likely given patient age, stage of pregnancy, and lack of cardiomegaly. 2. Evaluation for pulmonary emboli is limited due to respiratory motion and timing of the contrast bolus. However, the central pulmonary arteries are well seen with no emboli. The study was not repeated due to the patient's pregnant status. If high clinical concern remains for distal pulmonary emboli, the study could be repeated. Electronically Signed   By: Dorise Bullion III M.D   On: 10/16/2017 19:23        Scheduled Meds: . chlorhexidine  15 mL Mouth Rinse BID  . enoxaparin (LOVENOX) injection  40 mg Subcutaneous QHS  . mouth rinse  15 mL Mouth Rinse q12n4p  . nicotine  21 mg Transdermal Daily  . sodium chloride flush  3 mL Intravenous Q12H   Continuous Infusions: . ceFEPime (MAXIPIME) IV Stopped (10/18/17 0226)  . vancomycin Stopped (10/17/17 2213)     LOS: 2 days     Cordelia Poche, MD [facility] 10/18/2017, 8:14 AM Pager: [phone]  If 7PM-7AM, please contact night-coverage [email or web address] 10/18/2017, 8:14 AM

## 2017-10-18 NOTE — Consult Note (Addendum)
Catherine Cannon  JME:[id number] DOB: 1980-11-20 DOA: 10/16/2017 PCP: Patient, No Pcp Per    Reason for Consult / Chief Complaint:  Acute hypoxic respiratory failure and CAP  Consulting MD:  Lonny Prude  HPI/Brief Narrative   This is a 37 year old female smoker who pregnant with gestational age of [redacted] weeks 5 days, with estimated due date 04/21/2019. Was initially seen on 8/2 with 1 week history of congestion, and cough that progressed from dry to productive of yellow/green.  She had no fever or shortness of breath .  She was felt at that time to have a viral bronchitis, and was treated with cough suppression and prednisone taper.  She had initially started to improve, then she reported to the emergency room once again on 8/20 with worsening cough, chest discomfort, and some associated shortness of breath.  At this point she was sent home on a azithromycin.   8/24 she once again presented to the emergency room: Continuing to report cough, this is gotten worse.  Now short of breath at rest, having chest pain with cough.  She was found to be tachycardic, tachypneic, and pulse oximetry was in the mid 80s on room air.  Chest x-ray revealed bilateral multifocal airspace disease with a white blood cell count of 24.7.  CT of chest was negative for pulmonary emboli but again verified patchy bilateral airspace disease, she was started on cefepime and vancomycin, IV Solu-Medrol, supplemental oxygen, and admitted to the stepdown unit. 8/25: Still short of breath with pleuritic type chest pain.  Coughing some blood-tinged sputum more escalated to Venturi mask, then nonrebreather 8/26: Clinically feeling worse.  Placed on noninvasive pressure ventilation.  Pulmonary asked to evaluate and assist with care  Subjective  Feels worse, more short of breath.  Very anxious and scared  Assessment & Plan:  Acute hypoxic respiratory failure in the setting of severe community-acquired pneumonia, further complicated by what  appears to be progressive ARDS History of tobacco abuse -Denies current drug use -Urine strep negative -Had received 3 days of a azithromycin with continued clinical decline Portable chest x-ray personally reviewed: This demonstrates diffuse patchy bilateral airspace disease -Cannot rule out viral process Plan Continuing to rotate noninvasive positive pressure ventilation and will add heated high flow Continued pulse oximetry Lasix x1 Follow-up urine Legionella antigen Send respiratory viral panel Day #3 cefepime and vancomycin Add back azithromycin for atypical coverage Follow-up chest x-ray Check BNP Treat anxiety PRN bronchodilators Needs to stop smoking If intubated, will try to avoid benzodiazepines, sedation protocol ideally would be fentanyl, propofol w/ nimbex if NMB needed. Etomidate OK for intubation   Sepsis secondary to pneumonia -White blood cell count declining some, no significant fever Plan telemetry monitoring Antibiotics per above  Pregnancy, estimated due date February 2021 Plan We will coordinate with pharmacy for safe drug administration Obstetrics has been consulted by primary service    Best Practice / Goals of Care / Disposition.   DVT prophylaxis: Enoxaparin GI prophylaxis: Not applicable Diet: N.p.o. Mobility: Bedrest, advance as tolerated Code Status: Full code Family Communication: Pending  Disposition / Summary of Today's Plan 10/18/17   Critically ill with hypoxic respiratory failure now on BiPAP and clinically feeling some worse.  I am concerned that she is progressing to worsening ARDS.  We will continue to try supportive care with noninvasive ventilation and cycle this with high flow oxygen.  Adding atypical coverage back.  Trying diuresis.  High risk for needing intubation  Consultants:  Pulmonary  consulted 8/26  Procedures:   Significant Diagnostic Tests: CT chest 8/24 patchy bilateral pulmonary infiltrates with large  groundglass component and smaller regions of denser consolidation.  Active for pulmonary emboli  Micro Data: Urine strep antigen 8/24: Negative Urine Legionella antigen 8/24: Blood cultures 8/24: Respiratory viral panel 8/26>>>  Antimicrobials:  Azithromycin 8/22 8/23 Azithromycin 8/26>>> Cefepime 8/24>>> Vancomycin 8/24>>>   Objective    Examination: General: Anxious 37 year old white female currently on noninvasive ventilation HENT: Normocephalic atraumatic.  Mucous membranes moist.  BiPAP mask in place Lungs: Diffuse scattered rhonchi and rales, some accessory use this gets worse when she coughs or talks Cardiovascular: Tachycardic regular rate and rhythm Abdomen: Soft nontender no organomegaly Extremities: Capillary refill no edema Neuro: Alert oriented anxious GU: Yellow  Blood pressure 139/61, pulse (Abnormal) 102, temperature 98.8 F (37.1 C), temperature source Oral, resp. rate (Abnormal) 29, height 5\' 9"  (1.753 m), weight 87.1 kg, last menstrual period 07/14/2017, SpO2 95 %.    FiO2 (%):  [100 %] 100 %   Intake/Output Summary (Last 24 hours) at 10/18/2017 0940 Last data filed at 10/18/2017 0228 Gross per 24 hour  Intake 1035.74 ml  Output 1050 ml  Net -14.26 ml   Filed Weights   10/17/17 1800  Weight: 87.1 kg     Labs   CBC: Recent Labs  Lab 10/12/17 0759 10/16/17 1629 10/17/17 0407 10/18/17 0804  WBC 15.3* 24.7* 27.1* 20.9*  NEUTROABS  --  20.9* 24.7*  --   HGB 13.3 13.5 12.8 12.3  HCT 38.8 39.1 37.8 36.8  MCV 95.1 94.2 95.5 97.9  PLT 250 272 266 151   Basic Metabolic Panel: Recent Labs  Lab 10/12/17 0759 10/16/17 1629 10/17/17 0407 10/18/17 0804  NA 134* 139 139 140  K 3.7 3.4* 3.8 3.5  CL 102 103 106 104  CO2 22 25 25 26   GLUCOSE 105* 102* 134* 112*  BUN <5* <5* 6 6  CREATININE 0.55 0.46 0.45 0.35*  CALCIUM 9.1 9.4 8.8* 8.8*   GFR: Estimated Creatinine Clearance: 113.4 mL/min (A) (by C-G formula based on SCr of 0.35 mg/dL  (L)). Recent Labs  Lab 10/12/17 0759 10/16/17 1629 10/16/17 1754 10/17/17 0407 10/18/17 0804  WBC 15.3* 24.7*  --  27.1* 20.9*  LATICACIDVEN  --   --  0.94  --   --    Liver Function Tests: Recent Labs  Lab 10/16/17 1629  AST 33  ALT 24  ALKPHOS 75  BILITOT 0.6  PROT 7.3  ALBUMIN 3.5   Recent Labs  Lab 10/16/17 1629  LIPASE 19   No results for input(s): AMMONIA in the last 168 hours. ABG    Component Value Date/Time   PHART 7.365 10/17/2017 0930   PCO2ART 39.6 10/17/2017 0930   PO2ART 104 10/17/2017 0930   HCO3 22.0 10/17/2017 0930   TCO2 20 07/05/2009 0022   ACIDBASEDEF 2.5 (H) 10/17/2017 0930   O2SAT 97.5 10/17/2017 0930    Coagulation Profile: No results for input(s): INR, PROTIME in the last 168 hours. Cardiac Enzymes: No results for input(s): CKTOTAL, CKMB, CKMBINDEX, TROPONINI in the last 168 hours. HbA1C: No results found for: HGBA1C CBG: No results for input(s): GLUCAP in the last 168 hours.   Review of Systems:   Not able due to work of breathing  Past medical history  History reviewed. No pertinent past medical history. Social History   Social History   Socioeconomic History  . Marital status: Single    Spouse name: Not on file  .  Number of children: Not on file  . Years of education: Not on file  . Highest education level: Not on file  Occupational History  . Not on file  Social Needs  . Financial resource strain: Not on file  . Food insecurity:    Worry: Not on file    Inability: Not on file  . Transportation needs:    Medical: Not on file    Non-medical: Not on file  Tobacco Use  . Smoking status: Current Every Day Smoker    Packs/day: 1.00    Types: Cigarettes  . Smokeless tobacco: Never Used  Substance and Sexual Activity  . Alcohol use: Yes    Comment: occ   . Drug use: Yes    Types: Cocaine  . Sexual activity: Yes    Birth control/protection: None  Lifestyle  . Physical activity:    Days per week: Not on file     Minutes per session: Not on file  . Stress: Not on file  Relationships  . Social connections:    Talks on phone: Not on file    Gets together: Not on file    Attends religious service: Not on file    Active member of club or organization: Not on file    Attends meetings of clubs or organizations: Not on file    Relationship status: Not on file  . Intimate partner violence:    Fear of current or ex partner: Not on file    Emotionally abused: Not on file    Physically abused: Not on file    Forced sexual activity: Not on file  Other Topics Concern  . Not on file  Social History Narrative  . Not on file    Family history    Family History  Problem Relation Age of Onset  . Asthma Mother   . Cancer Other     Allergies No Known Allergies  Home meds  Prior to Admission medications   Medication Sig Start Date End Date Taking? Authorizing Provider  albuterol (PROVENTIL HFA;VENTOLIN HFA) 108 (90 Base) MCG/ACT inhaler Inhale 1-2 puffs into the lungs every 6 (six) hours as needed for wheezing or shortness of breath. 10/12/17  Yes Caryl Ada K, PA-C  ibuprofen (ADVIL,MOTRIN) 600 MG tablet Take 1 tablet (600 mg total) by mouth every 6 (six) hours as needed. 04/23/16  Yes Nona Dell, PA-C  amoxicillin (AMOXIL) 500 MG capsule Take 1 capsule (500 mg total) 3 (three) times daily by mouth. Patient not taking: Reported on 10/16/2017 01/05/17   Petrucelli, Glynda Jaeger, PA-C  azithromycin (ZITHROMAX) 250 MG tablet Take 1 tablet (250 mg total) by mouth daily. Take first 2 tablets together, then 1 every day until finished. Patient not taking: Reported on 10/16/2017 10/12/17   Fransico Meadow, PA-C     LOS: 2 days

## 2017-10-18 NOTE — Progress Notes (Signed)
   ObGyn Note:  Discussed with Dr Lonny Prude yesterday regarding managemtn and her pregnancy I confirmed the antibiotics as being appropriate in light of this clinical situation with pregnancy as well as benzodiaepines and hycodon as needed for cough  When possible recommend sonogram to confirm viability of pregnancy which may affect her clinical d management decisions  At any time you are welcome to call [phone] to discuss patient and any questions you may have.    Jacelyn Grip MD Attending Physician for the [facility] 10/18/2017 10:19 AM

## 2017-10-18 NOTE — Progress Notes (Signed)
[facility] Progress Note Patient Name: Lucile Hillmann DOB: 08-05-1980 MRN: [id number]   Date of Service  10/18/2017  HPI/Events of Note  ABG on 100%/PRVC 30 /TV 400/P 16 = 7.24/71.7/97.0/29.0  eICU Interventions  Will order: 1. Increase PRVC rate to 35. 2. Repeat ABG at 10 PM.     Intervention Category Major Interventions: Acid-Base disturbance - evaluation and management;Respiratory failure - evaluation and management  Lysle Dingwall 10/18/2017, 8:53 PM

## 2017-10-18 NOTE — Procedures (Signed)
Central Venous Catheter Insertion Procedure Note Maryum Batterson [id number] [birth year]  Procedure: Insertion of Central Venous Catheter Indications: Assessment of intravascular volume and Drug and/or fluid administration  Procedure Details Consent: Risks of procedure as well as the alternatives and risks of each were explained to the (patient/caregiver).  Consent for procedure obtained. Time Out: Verified patient identification, verified procedure, site/side was marked, verified correct patient position, special equipment/implants available, medications/allergies/relevent history reviewed, required imaging and test results available.  Performed  Maximum sterile technique was used including antiseptics, cap, gloves, gown, hand hygiene, mask and sheet. Skin prep: Chlorhexidine; local anesthetic administered A antimicrobial bonded/coated triple lumen catheter was placed in the right internal jugular vein using the Seldinger technique.  Evaluation Blood flow good Complications: No apparent complications Patient did tolerate procedure well. Chest X-ray ordered to verify placement.  CXR: pending.  Jacoya Bauman 10/18/2017, 7:00 PM

## 2017-10-18 NOTE — Progress Notes (Signed)
[facility] Progress Note Patient Name: Catherine Cannon DOB: [birth year] MRN: [id number]   Date of Service  10/18/2017  HPI/Events of Note  ABG on 100%/PRVC 35/TV 400/P 16 = 7.26/67/100/29.4. No room to increase RR at this point.   eICU Interventions  Continue present ventilator management.      Intervention Category Major Interventions: Respiratory failure - evaluation and management;Acid-Base disturbance - evaluation and management  Bennie Scaff [place] 10/18/2017, 10:35 PM

## 2017-10-18 NOTE — Progress Notes (Signed)
RR 40s HR 114. [place] coming to see pt. Pt. Still on BiPap

## 2017-10-18 NOTE — Procedures (Signed)
Intubation Procedure Note Taleeya Blondin [id number] 1980-11-21  Procedure: Intubation Indications: Respiratory insufficiency  Procedure Details Consent: Risks of procedure as well as the alternatives and risks of each were explained to the (patient/caregiver).  Consent for procedure obtained. Time Out: Verified patient identification, verified procedure, site/side was marked, verified correct patient position, special equipment/implants available, medications/allergies/relevent history reviewed, required imaging and test results available.  Performed  Maximum sterile technique was used including cap, gloves and gown.  Glidescope size 3, 1 attempt  Evaluation Hemodynamic Status: BP stable throughout; O2 sats: transiently fell during during procedure and currently acceptable Patient's Current Condition: stable Complications: No apparent complications Patient did tolerate procedure well. Chest X-ray ordered to verify placement.  CXR: pending.   Cambry Spampinato 10/18/2017

## 2017-10-18 NOTE — Procedures (Signed)
Arterial Catheter Insertion Procedure Note Catherine Cannon [id number] 07/01/1980  Procedure: Insertion of Arterial Catheter  Indications: Blood pressure monitoring and Frequent blood sampling  Procedure Details Consent: Unable to obtain consent because of emergent medical necessity. Time Out: Verified patient identification, verified procedure, site/side was marked, verified correct patient position, special equipment/implants available, medications/allergies/relevent history reviewed, required imaging and test results available.  Performed  Maximum sterile technique was used including antiseptics, cap, gloves, gown, hand hygiene, mask and sheet. Skin prep: Chlorhexidine; local anesthetic administered 20 gauge catheter was inserted into left radial artery using the Seldinger technique. ULTRASOUND GUIDANCE USED: NO Evaluation Blood flow good; BP tracing good. Complications: No apparent complications.   Irven Shelling 10/18/2017

## 2017-10-19 ENCOUNTER — Inpatient Hospital Stay (HOSPITAL_COMMUNITY): Payer: Medicaid Other

## 2017-10-19 DIAGNOSIS — J80 Acute respiratory distress syndrome: Secondary | ICD-10-CM

## 2017-10-19 DIAGNOSIS — J9601 Acute respiratory failure with hypoxia: Secondary | ICD-10-CM

## 2017-10-19 HISTORY — DX: Acute respiratory distress syndrome: J80

## 2017-10-19 LAB — COMPREHENSIVE METABOLIC PANEL
ALT: 21 U/L (ref 0–44)
AST: 32 U/L (ref 15–41)
Albumin: 2.3 g/dL — ABNORMAL LOW (ref 3.5–5.0)
Alkaline Phosphatase: 70 U/L (ref 38–126)
Anion gap: 7 (ref 5–15)
BUN: 12 mg/dL (ref 6–20)
CO2: 29 mmol/L (ref 22–32)
Calcium: 8.5 mg/dL — ABNORMAL LOW (ref 8.9–10.3)
Chloride: 105 mmol/L (ref 98–111)
Creatinine, Ser: 0.6 mg/dL (ref 0.44–1.00)
GFR calc non Af Amer: >60 mL/min (ref >60)
Glucose, Bld: 93 mg/dL (ref 70–99)
POTASSIUM: 3.6 mmol/L (ref 3.5–5.1)
SODIUM: 141 mmol/L (ref 135–145)
Total Bilirubin: 1 mg/dL (ref 0.3–1.2)
Total Protein: 5.9 g/dL — ABNORMAL LOW (ref 6.5–8.1)

## 2017-10-19 LAB — BLOOD GAS, ARTERIAL
ACID-BASE EXCESS: 0.4 mmol/L (ref 0.0–2.0)
ACID-BASE EXCESS: 1.7 mmol/L (ref 0.0–2.0)
Acid-Base Excess: 0.4 mmol/L (ref 0.0–2.0)
Acid-Base Excess: 1.5 mmol/L (ref 0.0–2.0)
BICARBONATE: 27.9 mmol/L (ref 20.0–28.0)
Bicarbonate: 27.7 mmol/L (ref 20.0–28.0)
Bicarbonate: 26.8 mmol/L (ref 20.0–28.0)
Bicarbonate: 27 mmol/L (ref 20.0–28.0)
DRAWN BY: 257881
DRAWN BY: 295031
Drawn by: 422461
Drawn by: 295031
FIO2: 100
FIO2: 80
FIO2: 80
FIO2: 60
LHR: 35 {breaths}/min
MECHVT: 400 mL
MECHVT: 400 mL
MECHVT: 400 mL
O2 SAT: 96.7 %
O2 Saturation: 98.5 %
O2 Saturation: 99.2 %
O2 Saturation: 98.5 %
PATIENT TEMPERATURE: 37
PCO2 ART: 64 mmHg — AB (ref 32.0–48.0)
PCO2 ART: 52.8 mmHg — AB (ref 32.0–48.0)
PCO2 ART: 49.5 mmHg — AB (ref 32.0–48.0)
PEEP: 16 cmH2O
PEEP: 16 cmH2O
PEEP: 16 cmH2O
PEEP: 16 cmH2O
PH ART: 7.327 — AB (ref 7.350–7.450)
PH ART: 7.334 — AB (ref 7.350–7.450)
PO2 ART: 97.4 mmHg (ref 83.0–108.0)
PO2 ART: 155 mmHg — AB (ref 83.0–108.0)
Patient temperature: 38.1
Patient temperature: 37
Patient temperature: 37
RATE: 35 resp/min
RATE: 34 resp/min
RATE: 34 resp/min
VT: 400 mL
pCO2 arterial: 54 mmHg — ABNORMAL HIGH (ref 32.0–48.0)
pH, Arterial: 7.266 — ABNORMAL LOW (ref 7.350–7.450)
pH, Arterial: 7.357 (ref 7.350–7.450)
pO2, Arterial: 120 mmHg — ABNORMAL HIGH (ref 83.0–108.0)
pO2, Arterial: 118 mmHg — ABNORMAL HIGH (ref 83.0–108.0)

## 2017-10-19 LAB — ECHOCARDIOGRAM COMPLETE
HEIGHTINCHES: 69 in
Weight: 3654.345 oz

## 2017-10-19 LAB — BODY FLUID CELL COUNT WITH DIFFERENTIAL
Lymphs, Fluid: 6 %
MONOCYTE-MACROPHAGE-SEROUS FLUID: 54 % (ref 50–90)
NEUTROPHIL FLUID: 40 % — AB (ref 0–25)
WBC FLUID: 450 uL (ref 0–1000)

## 2017-10-19 LAB — CBC
HCT: 33.3 % — ABNORMAL LOW (ref 36.0–46.0)
HEMOGLOBIN: 11 g/dL — AB (ref 12.0–15.0)
MCH: 32.6 pg (ref 26.0–34.0)
MCHC: 33 g/dL (ref 30.0–36.0)
MCV: 98.8 fL (ref 78.0–100.0)
Platelets: 214 10*3/uL (ref 150–400)
RBC: 3.37 MIL/uL — AB (ref 3.87–5.11)
RDW: 13.5 % (ref 11.5–15.5)
WBC: 17.1 10*3/uL — ABNORMAL HIGH (ref 4.0–10.5)

## 2017-10-19 LAB — TRIGLYCERIDES: TRIGLYCERIDES: 199 mg/dL — AB (ref <150)

## 2017-10-19 MED ORDER — NOREPINEPHRINE 4 MG/250ML-% IV SOLN: 0.0000 ug/min | INTRAVENOUS | Status: DC

## 2017-10-19 MED ORDER — ARTIFICIAL TEARS OPHTHALMIC OINT: 1.0000 "application " | TOPICAL_OINTMENT | Freq: Three times a day (TID) | OPHTHALMIC | Status: DC

## 2017-10-19 MED ORDER — FAMOTIDINE IN NACL 20-0.9 MG/50ML-% IV SOLN
20.0000 mg | Freq: Two times a day (BID) | INTRAVENOUS | Status: DC
  Administered 2017-10-19: 20 mg via INTRAVENOUS
  Filled 2017-10-19: qty 50

## 2017-10-19 MED ORDER — PROPOFOL 1000 MG/100ML IV EMUL: 5.0000 ug/kg/min | INTRAVENOUS | Status: DC

## 2017-10-19 MED ORDER — SODIUM CHLORIDE 0.9 % IV SOLN: 0.5000 ug/kg/min | INTRAVENOUS | Status: DC

## 2017-10-19 MED ORDER — ORAL CARE MOUTH RINSE: 15.0000 mL | Freq: Two times a day (BID) | OROMUCOSAL | 0 refills | Status: DC

## 2017-10-19 MED ORDER — POTASSIUM CHLORIDE 20 MEQ/15ML (10%) PO SOLN
40.0000 meq | Freq: Once | ORAL | Status: AC
  Administered 2017-10-19: 40 meq
  Filled 2017-10-19: qty 30

## 2017-10-19 MED ORDER — SODIUM CHLORIDE 0.9 % IV SOLN: 1.0000 g | Freq: Three times a day (TID) | INTRAVENOUS | Status: DC

## 2017-10-19 MED ORDER — FUROSEMIDE 10 MG/ML IJ SOLN
40.0000 mg | Freq: Once | INTRAMUSCULAR | Status: AC
  Administered 2017-10-19: 40 mg via INTRAVENOUS
  Filled 2017-10-19: qty 4

## 2017-10-19 MED ORDER — CHLORHEXIDINE GLUCONATE 0.12 % MT SOLN: 15.0000 mL | Freq: Two times a day (BID) | OROMUCOSAL | 0 refills | Status: DC

## 2017-10-19 MED ORDER — VANCOMYCIN HCL 10 G IV SOLR: 1250.0000 mg | Freq: Two times a day (BID) | INTRAVENOUS | Status: DC

## 2017-10-19 MED ORDER — FAMOTIDINE IN NACL 20-0.9 MG/50ML-% IV SOLN: 20.0000 mg | Freq: Two times a day (BID) | INTRAVENOUS | Status: DC

## 2017-10-19 MED ORDER — ONDANSETRON HCL 4 MG/2ML IJ SOLN: 4.0000 mg | Freq: Four times a day (QID) | INTRAMUSCULAR | 0 refills | Status: DC | PRN

## 2017-10-19 MED ORDER — ALBUTEROL SULFATE (2.5 MG/3ML) 0.083% IN NEBU: 2.5000 mg | INHALATION_SOLUTION | Freq: Four times a day (QID) | RESPIRATORY_TRACT | 12 refills | Status: DC | PRN

## 2017-10-19 MED ORDER — MUPIROCIN 2 % EX OINT: 1.0000 "application " | TOPICAL_OINTMENT | Freq: Two times a day (BID) | CUTANEOUS | 0 refills | Status: DC

## 2017-10-19 MED ORDER — SODIUM CHLORIDE 0.9 % IV SOLN: 500.0000 mg | INTRAVENOUS | Status: DC

## 2017-10-19 MED ORDER — CHLORHEXIDINE GLUCONATE CLOTH 2 % EX PADS: 6.0000 | MEDICATED_PAD | Freq: Every day | CUTANEOUS | Status: DC

## 2017-10-19 MED ORDER — SODIUM CHLORIDE 0.9 % IV SOLN: INTRAVENOUS | 0 refills | Status: DC

## 2017-10-19 NOTE — Procedures (Signed)
Bronchoscopy Procedure Note Ellar Hakala [id number] 04/23/1980  Procedure: Bronchoscopy Indications: Obtain specimens for culture and/or other diagnostic studies  Procedure Details Consent: Risks of procedure as well as the alternatives and risks of each were explained to the (patient/caregiver).  Consent for procedure obtained. Time Out: Verified patient identification, verified procedure, site/side was marked, verified correct patient position, special equipment/implants available, medications/allergies/relevent history reviewed, required imaging and test results available.  Performed  In preparation for procedure, patient was given 100% FiO2 and bronchoscope lubricated. Sedation: Paralytics, fentanyl, propofol  Airway entered and the following bronchi were examined: RUL, RML, RLL, LUL, LLL and Bronchi.   Procedures performed: Airways looked pristine with no secretions.  Bronch was wedged in the right middle lobe lateral segment.  BAL performed with 60 cc x 3 aliquot.  The pullback on each sequential aliquot was slightly bloodier.  Total 100cc return obtained. The aliquots were combined and sent for labs, analysis.      Evaluation Hemodynamic Status: BP stable throughout; O2 sats: stable throughout Patient's Current Condition: stable Specimens:  Sent serosanguinous fluid Complications: No apparent complications Patient did tolerate procedure well.   Halsey Persaud 10/19/2017

## 2017-10-19 NOTE — Progress Notes (Addendum)
Catherine Cannon  JME:[id number] DOB: 03-May-1980 DOA: 10/16/2017 PCP: Patient, No Pcp Per    Reason for Consult / Chief Complaint:  Acute hypoxic respiratory failure and CAP  Consulting MD:  Lonny Prude  HPI/Brief Narrative   This is a 37 year old female smoker who pregnant with gestational age of [redacted] weeks 5 days, with estimated due date 04/21/2019.  Admitted on 8/24 with working diagnosis of community-acquired pneumonia and progressive ARDS.  Broad-spectrum antibiotics initiated with cefepime and vancomycin.  She had completed 3 days of azithromycin as outpatient  8/25: Intubated for progressive FiO2 requirements placed on neuromuscular blockade and heavy sedation, azithromycin added back 8/26: Worsening ARDS on chest x-ray.  Remains heavily sedated and supported on neuromuscular blockade with low tidal volume ventilation.  Still on high PEEP/FiO2 (16/100).  Respiratory viral panel positive for rhinovirus.  Sputum culture still pending.  Calling [facility] for consideration for ECMO  Subjective  Heavily sedated, now on day #2 neuromuscular blockade Assessment & Plan:  Acute hypoxic respiratory failure in the setting of severe community-acquired pneumonia, further complicated by what appears to be progressive ARDS History of tobacco abuse -Chest x-ray with progressive ARDS support tubes and lines in satisfactory position -WBC count improving Plan Continuing low tidal volume ventilation Continue neuromuscular blockade infusion, now on day #1 of 3 Continue propofol and fentanyl, trying to avoid benzodiazepines during pregnancy Reaching out to [facility] for possible transfer for ECMO If not transferred today we will need to place her in prone position Lasix x1 Day #4 cefepime and vancomycin Day #2 of azithromycin   Hypotension: Likely secondary to sedating medications plus/minus sepsis She is on low-dose Levophed, some of this may be medication related Plan telemetry monitoring Antibiotics per  above  Pregnancy, estimated due date February 2021 Plan We have been coordinating with pharmacy for safe drug administration Stat tricks was consulted   Best Practice / Goals of Care / Disposition.   DVT prophylaxis: Enoxaparin GI prophylaxis: Pepcid Diet: N.p.o., starting tube feeds 8/27 Mobility: Bedrest, advance as tolerated Code Status: Full code Family Communication: Pending  Disposition / Summary of Today's Plan 10/19/17   With resultant ARDS.  Also some concern about eosinophilic pneumonia.  X-ray looks worse today remains on high FiO2 support.  Reaching out to [facility] for possible ECMO consideration.  In meantime will need to consider placing her in prone position.  Will add Lasix.  Continue antibiotics.  Consultants:  Pulmonary consulted 8/26  Procedures: Oral endotracheal tube placed 8/26 Left IJ catheter placed 826   Significant Diagnostic Tests: CT chest 8/24 patchy bilateral pulmonary infiltrates with large groundglass component and smaller regions of denser consolidation.  Active for pulmonary emboli  Micro Data: Urine strep antigen 8/24: Negative Urine Legionella antigen 8/24: Negative Blood cultures 8/24: Respiratory viral panel 8/26>>> rhinovirus Respiratory culture 8/26: Few gram-negative rods, rare gram-positive rods, rare gram-positive cocci>>>  Antimicrobials:  Azithromycin 8/22 8/23 Azithromycin 8/26>>> Cefepime 8/24>>> Vancomycin 8/24>>>   Objective    Examination: General: Heavily sedated and currently on neuromuscular blockade HEENT normocephalic atraumatic orally intubated left IJ catheter unremarkable Pulmonary: Equal chest rise, plateau pressure 29, some scattered rales. Cardiac: Tachycardic regular rate and rhythm Abdomen: Soft, not tender, no organomegaly Extremities: Warm dry brisk capillary refill Neuro: Sedated, currently on neuromuscular blockade GU: Clear yellow via Foley catheter  Blood pressure (Abnormal) 121/56, pulse 86,  temperature 100 F (37.8 C), resp. rate (Abnormal) 35, height 5\' 9"  (1.753 m), weight 103.6 kg, last menstrual period 07/14/2017, SpO2  99 %. CVP:  [10 mmHg-17 mmHg] 14 mmHg  Vent Mode: PRVC FiO2 (%):  [80 %-100 %] 80 % Set Rate:  [18 bmp-35 bmp] 35 bmp Vt Set:  [400 mL-530 mL] 400 mL PEEP:  [12 cmH20-16 cmH20] 16 cmH20 Plateau Pressure:  [29 cmH20-35 cmH20] 29 cmH20   Intake/Output Summary (Last 24 hours) at 10/19/2017 0844 Last data filed at 10/19/2017 3614 Gross per 24 hour  Intake 2269.14 ml  Output 2135 ml  Net 134.14 ml   Filed Weights   10/17/17 1800 10/19/17 0500  Weight: 87.1 kg 103.6 kg     Labs   CBC: Recent Labs  Lab 10/16/17 1629 10/17/17 0407 10/18/17 0804 10/19/17 0434  WBC 24.7* 27.1* 20.9* 17.1*  NEUTROABS 20.9* 24.7*  --   --   HGB 13.5 12.8 12.3 11.0*  HCT 39.1 37.8 36.8 33.3*  MCV 94.2 95.5 97.9 98.8  PLT 272 266 252 431   Basic Metabolic Panel: Recent Labs  Lab 10/16/17 1629 10/17/17 0407 10/18/17 0804 10/19/17 0434  NA 139 139 140 141  K 3.4* 3.8 3.5 3.6  CL 103 106 104 105  CO2 25 25 26 29   GLUCOSE 102* 134* 112* 93  BUN <5* 6 6 12   CREATININE 0.46 0.45 0.35* 0.60  CALCIUM 9.4 8.8* 8.8* 8.5*   GFR: Estimated Creatinine Clearance: 123.4 mL/min (by C-G formula based on SCr of 0.6 mg/dL). Recent Labs  Lab 10/16/17 1629 10/16/17 1754 10/17/17 0407 10/18/17 0804 10/18/17 2002 10/19/17 0434  WBC 24.7*  --  27.1* 20.9*  --  17.1*  LATICACIDVEN  --  0.94  --   --  0.9  --    Liver Function Tests: Recent Labs  Lab 10/16/17 1629 10/19/17 0434  AST 33 32  ALT 24 21  ALKPHOS 75 70  BILITOT 0.6 1.0  PROT 7.3 5.9*  ALBUMIN 3.5 2.3*   Recent Labs  Lab 10/16/17 1629  LIPASE 19   No results for input(s): AMMONIA in the last 168 hours. ABG    Component Value Date/Time   PHART 7.266 (L) 10/19/2017 0447   PCO2ART 64.0 (H) 10/19/2017 0447   PO2ART 97.4 10/19/2017 0447   HCO3 27.7 10/19/2017 0447   TCO2 20 07/05/2009 0022    ACIDBASEDEF 2.5 (H) 10/17/2017 0930   O2SAT 96.7 10/19/2017 0447    Coagulation Profile: No results for input(s): INR, PROTIME in the last 168 hours. Cardiac Enzymes: No results for input(s): CKTOTAL, CKMB, CKMBINDEX, TROPONINI in the last 168 hours. HbA1C: No results found for: HGBA1C CBG: No results for input(s): GLUCAP in the last 168 hours.  Erick Colace ACNP-BC [place] Pager # [phone] OR # [phone] if no answer

## 2017-10-19 NOTE — Progress Notes (Signed)
Patient's chart reviewed. Intubated overnight. TRH will sign off. Will pick up when patient is no longer requiring critical care services.  Cordelia Poche, MD [facility] 10/19/2017, 7:56 AM

## 2017-10-19 NOTE — Progress Notes (Signed)
Pt. With acute respiratory failure on ARDS Protocol with PEEP of 16 & FiO2 of 60 %, SaO2 96%. Patient currently sedated(Propofol, Fentanyl) and paralyzed(Nimbex). Pt. has been weaned off Levophed . Per MD order proning protocol initiated. Will continue to monitor.

## 2017-10-19 NOTE — Discharge Summary (Addendum)
Physician Discharge Summary         Patient ID: Catherine Cannon MRN: [id number] DOB/AGE: 1980-06-16 37 y.o.  Admit date: 10/16/2017 Discharge date: 10/19/2017  Discharge Diagnoses:    Acute hypoxic respiratory failure  community-acquired pneumonia (no organism specified) Rhinovirus infection ARDS History of tobacco abuse Hypotension: Secondary to sedating drugs Pregnancy, estimated due date February 2020   Discharge summary    This is a 36 year old female smoker who pregnant with gestational age of [redacted] weeks 5 days, with estimated due date 04/21/2019.  Admitted on 8/24 with working diagnosis of community-acquired pneumonia and progressive ARDS.  Broad-spectrum antibiotics initiated with cefepime and vancomycin.  She had completed 3 days of azithromycin as outpatient  Pt is a smoker, does not vape. Works as a Training and development officer at [employer]. No known exposures.   8/25: Intubated for progressive FiO2 requirements placed on neuromuscular blockade and heavy sedation, azithromycin added back. 8/26: Worsening ARDS on chest x-ray.  Remains heavily sedated and supported on neuromuscular blockade with low tidal volume ventilation.  Still on high PEEP/FiO2 (16/100).  Respiratory viral panel positive for rhinovirus.  Sputum culture still pending.  We performed bedside bronchoscopy on 8/26.  The airways looked pristine with no secretions.  The bronch was wedged in the right middle lobe lateral segment.  BAL was performed with 60 cc x 3 aliquot.  The pullback on its sequential aliquot was slightly bloody for total of 100 cc returned.  We sent cell count, eosinophils as well.  If lymphocytes or eosinophils elevated we would consider starting Solu-Medrol.  In the meantime we placed her in the prone position, we have been successful and weaning her FiO2 down to currently 60% however she still remains on 16 of PEEP following transition to prone position.  At this point she has been accepted at [facility], given her high PEEP and FiO2 requirements and potential need for ECMO therapy. She has been accepted by Dr Darlis Loan   Addendum: BAL cell count 8/27-WBC 450, 6% lymphs, 40% neutrophils, 54% monocyte macrophage  Discharge Plan by Active Problems    Acute hypoxic respiratory failure in the setting of severe community-acquired pneumonia, further complicated by what appears to be progressive ARDS History of tobacco abuse -Chest x-ray with progressive ARDS support tubes and lines in satisfactory position -WBC count improving Plan Continuing low tidal volume ventilation Continue neuromuscular blockade infusion, now on day #1 of 3 Continue propofol and fentanyl, trying to avoid benzodiazepines during pregnancy Continue prone protocol at the discretion of intensive care team at [facility] PRN diuretics Day #4 cefepime and vancomycin Day #2 of azithromycin   Hypotension: Likely secondary to sedating medications plus/minus sepsis She is on low-dose Levophed, some of this may be medication related Plan telemetry monitoring Antibiotics per above  Pregnancy, estimated due date February 2020 Plan We have been coordinating with pharmacy for safe drug administration Obstetrics was consulted  [facility] tests/ studies  CT chest 8/24 patchy bilateral pulmonary infiltrates with large groundglass component and smaller regions of denser consolidation.  Active for pulmonary emboli  Oral endotracheal tube placed 8/26  Left IJ catheter placed 8/26  Bronchoscopy 8/27  Echo 8/27 - Normal LV size with EF 55-60%. Normal diastolic function. Normal   RV size and systolic function. No significant valvular   abnormalities. The IVC was dilated, suggesting elevated RV   filling pressure.  Culture data/antimicrobials   Urine strep antigen 8/24: Negative Urine Legionella antigen 8/24: Negative Blood cultures  8/24: Respiratory viral panel 8/26>>> rhinovirus Respiratory  culture 8/26: Few gram-negative rods, rare gram-positive rods, rare gram-positive cocci>>>  Antimicrobials:  Azithromycin 8/22 8/23 Azithromycin 8/26>>> Cefepime 8/24>>> Vancomycin 8/24>>>   Consults  None    Discharge Exam: Blood Pressure (Abnormal) 121/56   Pulse 84   Temperature 100 F (37.8 C)   Respiration (Abnormal) 35   Height _0  (1.753 m)   Weight 103.6 kg   Last Menstrual Period 07/14/2017 (Exact Date) Comment: irregular  Oxygen Saturation 99%   Body Mass Index 33.73 kg/m   General: Currently sedated and on neuromuscular blockade in the prone position HEENT normocephalic atraumatic orally intubated Pulmonary: Diffuse rhonchi/rales Cardiac: Regular rate and rhythm Abdomen: Soft nontender Extremities: Brisk cap refill no edema GU: Clear yellow  Labs at discharge   Lab Results  Component Value Date   CREATININE 0.60 10/19/2017   BUN 12 10/19/2017   NA 141 10/19/2017   K 3.6 10/19/2017   CL 105 10/19/2017   CO2 29 10/19/2017   Lab Results  Component Value Date   WBC 17.1 (H) 10/19/2017   HGB 11.0 (L) 10/19/2017   HCT 33.3 (L) 10/19/2017   MCV 98.8 10/19/2017   PLT 214 10/19/2017   Lab Results  Component Value Date   ALT 21 10/19/2017   AST 32 10/19/2017   ALKPHOS 70 10/19/2017   BILITOT 1.0 10/19/2017   Lab Results  Component Value Date   INR 0.93 07/05/2009    Current radiological studies    Dg Chest 1 View  Result Date: 10/18/2017 CLINICAL DATA:  Central line placement, NG tube placement EXAM: CHEST  1 VIEW COMPARISON:  10/18/2017 FINDINGS: Left central line is been placed with the tip in the SVC. No pneumothorax. NG tube enters the stomach. Diffuse bilateral airspace disease again noted. Heart is borderline in size. No visible significant effusions. IMPRESSION: Left central line tip in the SVC. No pneumothorax. NG tube enters stomach. Continued diffuse bilateral airspace disease, not significantly changed. Electronically Signed   By:  Rolm Baptise M.D.   On: 10/18/2017 19:24   Dg Abd 1 View  Result Date: 10/18/2017 CLINICAL DATA:  Central line placement and gastric tube placement. EXAM: ABDOMEN - 1 VIEW COMPARISON:  None. FINDINGS: The tip and side port of a gastric tube extends below the left hemidiaphragm within the expected location of the stomach in the left upper quadrant. The tip of a central line catheter is seen along the expected location of the distal SVC. Cardiomegaly with air bronchograms and patchy bilateral airspace opacities are seen within the included lungs. No significant effusion. No pneumothorax. No pneumoperitoneum. No acute osseous abnormality. IMPRESSION: Satisfactory support line and tube positions. Nonspecific bilateral multilobar airspace opacities query multifocal pneumonia given air bronchograms seen at the lung bases. Electronically Signed   By: Ashley Royalty M.D.   On: 10/18/2017 19:38   Dg Chest Port 1 View  Result Date: 10/19/2017 CLINICAL DATA:  Respiratory failure, intubated patient. EXAM: PORTABLE CHEST 1 VIEW COMPARISON:  Portable chest x-ray of October 18, 2017 FINDINGS: The lungs are well-expanded. Confluent airspace opacity is present in the mid and lower lungs greatest on the left. This is more conspicuous today. The cardiac silhouette is top-normal in size. The pulmonary vascularity is engorged and indistinct. The endotracheal tube tip projects 4.5 cm above the carina. The esophagogastric tube tip and proximal port project below the left hemidiaphragm. The left internal jugular venous catheter tip projects over the midportion of the SVC. IMPRESSION:  Slight interval worsening and interstitial and airspace opacities consistent with progressive pneumonia or pulmonary edema. The support tubes are in reasonable position. Electronically Signed   By: David  Martinique M.D.   On: 10/19/2017 09:02   Dg Chest Port 1 View  Result Date: 10/18/2017 CLINICAL DATA:  Line placement and intubation EXAM: PORTABLE CHEST  1 VIEW COMPARISON:  10/18/2017 chest radiograph FINDINGS: Endotracheal tube tip is at the level of the clavicular heads. There is no visible central venous catheter. Multifocal consolidation within both lungs is unchanged, right worse than left. IMPRESSION: Satisfactory position of endotracheal tube. No visualized central venous catheter. Electronically Signed   By: Ulyses Jarred M.D.   On: 10/18/2017 16:24   Dg Chest Port 1 View  Result Date: 10/18/2017 CLINICAL DATA:  Shortness of breath.  Pneumonia. EXAM: PORTABLE CHEST 1 VIEW COMPARISON:  Chest CT from 2 days ago. FINDINGS: Patchy bilateral airspace disease consistent with history of pneumonia. There is increased density about the right hilum. Stable heart size accentuated by technique. Haziness of the bilateral chest. No visible effusion or pneumothorax. IMPRESSION: Multifocal pneumonia progressed about the right hilum at least. Increased hazy density, question volume overload. Electronically Signed   By: Monte Fantasia M.D.   On: 10/18/2017 11:18    Disposition:    Discharge disposition: [place] Not Defined     [facility]   Discharge Instructions    Discharge instructions   Complete by:  As directed    Vent Mode: PRVC Current vent settings  FiO2 (%):  [60 %-100 %] 60 % Set Rate:  [18 bmp-35 bmp] 35 bmp Vt Set:  [400 mL-530 mL] 400 mL PEEP:  [12 cmH20-16 cmH20] 16 cmH20 Plateau Pressure:  [29 cmH20-35 cmH20] 29 cmH20     Allergies as of 10/19/2017   No Known Allergies        Allergies as of 10/19/2017   No Known Allergies     Medication List    Stop taking these medications   albuterol 108 (90 Base) MCG/ACT inhaler Commonly known as:  PROVENTIL HFA;VENTOLIN HFA Replaced by:  albuterol (2.5 MG/3ML) 0.083% nebulizer solution   amoxicillin 500 MG capsule Commonly known as:  AMOXIL   azithromycin 250 MG tablet Commonly known as:  ZITHROMAX   ibuprofen 600 MG  tablet Commonly known as:  ADVIL,MOTRIN     Take these medications   albuterol (2.5 MG/3ML) 0.083% nebulizer solution Commonly known as:  PROVENTIL Take 3 mLs (2.5 mg total) by nebulization every 6 (six) hours as needed for wheezing or shortness of breath. Replaces:  albuterol 108 (90 Base) MCG/ACT inhaler   artificial tears Oint ophthalmic ointment Commonly known as:  LACRILUBE Place 1 application into both eyes every 8 (eight) hours.   azithromycin 500 mg in sodium chloride 0.9 % 250 mL Inject 500 mg into the vein daily. Start taking on:  10/20/2017   ceFEPIme 1 g in sodium chloride 0.9 % 100 mL Inject 1 g into the vein every 8 (eight) hours.   chlorhexidine 0.12 % solution Commonly known as:  PERIDEX 15 mLs by Mouth Rinse route 2 (two) times daily.   Chlorhexidine Gluconate Cloth 2 % Pads Apply 6 each topically daily at 6 (six) AM. Start taking on:  10/20/2017   cisatracurium 200 mg in sodium chloride 0.9 % 180 mL Inject 33.1-662 mcg/min into the vein continuous.   famotidine 20-0.9 MG/50ML-% Commonly known as:  PEPCID Inject 50 mLs (20 mg total) into the  vein every 12 (twelve) hours.   mouth rinse Liqd solution 15 mLs by Mouth Rinse route 2 times daily at 12 noon and 4 pm.   mupirocin ointment 2 % Commonly known as:  BACTROBAN Place 1 application into the nose 2 (two) times daily.   norepinephrine 4-5 MG/250ML-% Soln Commonly known as:  LEVOPHED Inject 0-40 mcg/min into the vein continuous.   ondansetron 4 MG/2ML Soln injection Commonly known as:  ZOFRAN Inject 2 mLs (4 mg total) into the vein every 6 (six) hours as needed for nausea.   propofol 1000 MG/100ML Emul injection Commonly known as:  DIPRIVAN Inject 331-5,296 mcg/min into the vein continuous.   sodium chloride 0.9 % infusion 6m/hr   vancomycin 1,250 mg in sodium chloride 0.9 % 250 mL Inject 1,250 mg into the vein every 12 (twelve) hours.       Discharge Condition:    critical  Physician  Statement:   The Patient was personally examined, the discharge assessment and plan has been personally reviewed and I agree with ACNP Babcock's assessment and plan. 32 minutes of time have been dedicated to discharge assessment, planning and discharge instructions.   Signed: PClementeen Graham8/27/2019, 1:29 PM   Attested PMarshell GarfinkelMD [facility] 10/19/2017, 2:04 PM

## 2017-10-19 NOTE — Progress Notes (Signed)
  Echocardiogram 2D Echocardiogram has been performed.  Jannett Celestine 10/19/2017, 12:02 PM

## 2017-10-19 NOTE — Progress Notes (Signed)
Initial Nutrition Assessment  DOCUMENTATION CODES:   Obesity unspecified  INTERVENTION:  - Will monitor for ability for post pyloric tube placement and will provide TF recommendations/orders at that time.  - Will monitor Propofol rate/changes.   NUTRITION DIAGNOSIS:   Inadequate oral intake related to inability to eat as evidenced by NPO status.  GOAL:   Provide needs based on ASPEN/SCCM guidelines  MONITOR:   Vent status, Weight trends, Labs, I & O's  REASON FOR ASSESSMENT:   Ventilator  ASSESSMENT:   37 year old female smoker, pregnant (13 weeks 5 days, with estimated due date 04/21/2019).  Admitted on 8/24 with diagnosis of CAP and progressive ARDS; broad-spectrum antibiotics were initiated at time of admission and she had completed 3 days of antibiotics PTA. On 8/26 PM she was intubated for progressive FiO2 requirements and placed on neuromuscular blockade and heavy sedation. She was noted to have worsening ARDS per CXR. Respiratory viral panel positive for rhinovirus. PCCM communicating with [facility] for transfer for ECMO. Plan to start prone positioning today (8/27).  Patient intubated, sedated and chemically paralyzed, with OGT in place. Patient to be proned shortly. Talked with Laurey Arrow who states plan to attempt post-pyloric placement of tube and to re-assess in 24 hours concerning initiation of TF. Several people at bedside, including mom. Mom reports that that for the past 3 weeks patient has had decreased PO intakes d/t difficulty breathing/feelings of SOB. She states "it had nothing to do with her stomach" indicating patient had not mentioned abdominal pain or nausea during that time. Noted that weights on 03/16/16, 03/14/16, and 10/12/17 were all recorded as 86.2 kg (190 lb); question accuracy of this. Current weight is 103.6 kg (228 lb) and seems more consistent with NFPE/patient's physical appearance.   Bronch done earlier this AM. Per Laurey Arrow and Dr. Matilde Bash notes this AM: severe  ARDS with ongoing discussions with MD at [facility] concerning transfer for ECMO, HTN thought to be 2/2 sedating medications and plan to wean Levo as able, CAP.   Patient is currently intubated on ventilator support MV: 13.3 L/min Temp (24hrs), Avg:100.2 F (37.9 C), Min:99.7 F (37.6 C), Max:100.6 F (38.1 C) Propofol: 26.1 ml/hr (689 kcal) BP: 123/60 and MAP: 77   Medications reviewed; 20 mg IV Pepcid BID, 40 mg IV Lasix x1 dose today, 10 mEq IV KCl x4 runs yesterday, 40 mEq KCl per OGT x1 dose today.  Labs reviewed; Ca: 8.5 mg/dL. Drips; Levo @ 2 mcg/min, Nimbex @ 2.5 mcg/kg/min, Propofol @ 50 mcg/kg/min, Fentanyl @ 350 mcg/hr.      NUTRITION - FOCUSED PHYSICAL EXAM:   Completed; no muscle and no fat wasting, mild edema to all extremities.   Diet Order:   Diet Order            Diet regular Room service appropriate? Yes; Fluid consistency: Thin  Diet effective now              EDUCATION NEEDS:   No education needs have been identified at this time  Skin:  Skin Assessment: Reviewed RN Assessment  Last BM:  8/26  Height:   Ht Readings from Last 1 Encounters:  10/18/17 5\' 9"  (1.753 m)    Weight:   Wt Readings from Last 1 Encounters:  10/19/17 103.6 kg    Ideal Body Weight:  65.91 kg  BMI:  Body mass index is 33.73 kg/m.  Estimated Nutritional Needs:   Kcal:  1140-1450  Protein:  >/= 132 grams  Fluid:  >/= 2  L/day     Jarome Matin, MS, RD, LDN, [facility] Inpatient Clinical Dietitian Pager # [phone] After hours/weekend pager # [phone]

## 2017-10-19 NOTE — Care Management Note (Signed)
Case Management Note  Patient Details  Name: Ivannah Zody MRN: [id number] Date of Birth: October 27, 1980  Subjective/Objective:                  Heavily sedated, now on day #2 neuromuscular blockade Assessment & Plan:  Acute hypoxic respiratory failure in the setting of severe community-acquired pneumonia, further complicated by what appears to be progressive ARDS History of tobacco abuse -Chest x-ray with progressive ARDS support tubes and lines in satisfactory position -WBC count improving Plan Continuing low tidal volume ventilation Continue neuromuscular blockade infusion, now on day #1 of 3 Continue propofol and fentanyl, trying to avoid benzodiazepines during pregnancy Reaching out to [facility] for possible transfer for ECMO If not transferred today we will need to place her in prone position Lasix x1 Day #4 cefepime and vancomycin Day #2 of azithromycin   Action/Plan: Following for progression and cm needs  Expected Discharge Date:                  Expected Discharge Plan:  Home/Self Care  In-House Referral:     Discharge planning Services  CM Consult  Post Acute Care Choice:    Choice offered to:     DME Arranged:    DME Agency:     HH Arranged:    HH Agency:     Status of Service:  In process, will continue to follow  If discussed at Long Length of Stay Meetings, dates discussed:    Additional Comments:  Leeroy Cha, RN 10/19/2017, 9:15 AM

## 2017-10-20 LAB — ANCA TITERS
Atypical P-ANCA titer: <1:20 {titer}
C-ANCA: <1:20 {titer}

## 2017-10-20 LAB — ANTINUCLEAR ANTIBODIES, IFA: ANTINUCLEAR ANTIBODIES, IFA: NEGATIVE

## 2017-10-20 LAB — PNEUMOCYSTIS JIROVECI SMEAR BY DFA: Pneumocystis jiroveci Ag: NEGATIVE

## 2017-10-20 LAB — C4 COMPLEMENT: COMPLEMENT C4, BODY FLUID: 34 mg/dL (ref 14–44)

## 2017-10-20 LAB — COMPLEMENT, TOTAL

## 2017-10-20 LAB — IGG 4: IGG 4: 11 mg/dL (ref 2–96)

## 2017-10-20 LAB — RHEUMATOID FACTOR: Rhuematoid fact SerPl-aCnc: 22.6 IU/mL — ABNORMAL HIGH (ref 0.0–13.9)

## 2017-10-20 LAB — CYCLIC CITRUL PEPTIDE ANTIBODY, IGG/IGA: CCP Antibodies IgG/IgA: 1 units (ref 0–19)

## 2017-10-20 LAB — GLOMERULAR BASEMENT MEMBRANE ANTIBODIES: GBM Ab: 2 units (ref 0–20)

## 2017-10-20 LAB — ACID FAST SMEAR (AFB): ACID FAST SMEAR - AFSCU2: NEGATIVE

## 2017-10-20 LAB — ACID FAST SMEAR (AFB, MYCOBACTERIA)

## 2017-10-20 LAB — C3 COMPLEMENT: C3 Complement: 164 mg/dL (ref 82–167)

## 2017-10-21 LAB — CULTURE, RESPIRATORY
CULTURE: NORMAL
GRAM STAIN: NONE SEEN

## 2017-10-21 LAB — CULTURE, RESPIRATORY W GRAM STAIN

## 2017-10-21 LAB — CULTURE, BAL-QUANTITATIVE: SPECIAL REQUESTS: NORMAL

## 2017-10-21 LAB — ANTI-DNA ANTIBODY, DOUBLE-STRANDED: ds DNA Ab: <1 IU/mL (ref 0–9)

## 2017-10-21 LAB — SJOGRENS SYNDROME-A EXTRACTABLE NUCLEAR ANTIBODY: SSA (Ro) (ENA) Antibody, IgG: <0.2 AI (ref 0.0–0.9)

## 2017-10-21 LAB — CULTURE, BAL-QUANTITATIVE W GRAM STAIN: Culture: NO GROWTH

## 2017-10-21 LAB — ANTI-SMITH ANTIBODY: ENA SM Ab Ser-aCnc: <0.2 AI (ref 0.0–0.9)

## 2017-10-21 LAB — SJOGRENS SYNDROME-B EXTRACTABLE NUCLEAR ANTIBODY

## 2017-10-22 LAB — CULTURE, BLOOD (ROUTINE X 2)
Culture: NO GROWTH
Culture: NO GROWTH
Special Requests: ADEQUATE

## 2017-10-22 LAB — ANTIPHOSPHOLIPID SYNDROME EVAL, BLD
Anticardiolipin IgG: <9 GPL U/mL (ref 0–14)
DRVVT: 41.6 s (ref 0.0–47.0)
PTT LA: 43.5 s (ref 0.0–51.9)
Phosphatydalserine, IgA: 4 APS IgA (ref 0–20)
Phosphatydalserine, IgG: 3 GPS IgG (ref 0–11)
Phosphatydalserine, IgM: 10 MPS IgM (ref 0–25)

## 2017-11-11 DIAGNOSIS — O26872 Cervical shortening, second trimester: Secondary | ICD-10-CM | POA: Insufficient documentation

## 2017-11-11 DIAGNOSIS — O09512 Supervision of elderly primigravida, second trimester: Secondary | ICD-10-CM | POA: Insufficient documentation

## 2017-11-11 DIAGNOSIS — O358XX0 Maternal care for other (suspected) fetal abnormality and damage, not applicable or unspecified: Secondary | ICD-10-CM | POA: Insufficient documentation

## 2017-11-11 DIAGNOSIS — O35BXX0 Maternal care for other (suspected) fetal abnormality and damage, fetal cardiac anomalies, not applicable or unspecified: Secondary | ICD-10-CM | POA: Insufficient documentation

## 2017-11-15 DIAGNOSIS — A5901 Trichomonal vulvovaginitis: Secondary | ICD-10-CM | POA: Insufficient documentation

## 2017-11-15 DIAGNOSIS — O23592 Infection of other part of genital tract in pregnancy, second trimester: Secondary | ICD-10-CM

## 2017-11-17 LAB — FUNGUS CULTURE WITH STAIN

## 2017-11-17 LAB — FUNGAL ORGANISM REFLEX

## 2017-11-17 LAB — FUNGUS CULTURE RESULT

## 2017-12-02 LAB — ACID FAST CULTURE WITH REFLEXED SENSITIVITIES: ACID FAST CULTURE - AFSCU3: NEGATIVE

## 2017-12-13 ENCOUNTER — Encounter: Payer: Self-pay | Admitting: *Deleted

## 2017-12-13 DIAGNOSIS — O09519 Supervision of elderly primigravida, unspecified trimester: Secondary | ICD-10-CM

## 2017-12-13 DIAGNOSIS — O26879 Cervical shortening, unspecified trimester: Secondary | ICD-10-CM | POA: Insufficient documentation

## 2017-12-13 DIAGNOSIS — O99310 Alcohol use complicating pregnancy, unspecified trimester: Secondary | ICD-10-CM | POA: Insufficient documentation

## 2017-12-13 DIAGNOSIS — O0990 Supervision of high risk pregnancy, unspecified, unspecified trimester: Secondary | ICD-10-CM | POA: Insufficient documentation

## 2017-12-13 DIAGNOSIS — F1910 Other psychoactive substance abuse, uncomplicated: Secondary | ICD-10-CM

## 2017-12-22 ENCOUNTER — Ambulatory Visit (INDEPENDENT_AMBULATORY_CARE_PROVIDER_SITE_OTHER): Payer: Self-pay | Admitting: Family Medicine

## 2017-12-22 ENCOUNTER — Encounter: Payer: Self-pay | Admitting: Family Medicine

## 2017-12-22 VITALS — BP 116/61 | HR 77 | Wt 211.3 lb

## 2017-12-22 DIAGNOSIS — O26872 Cervical shortening, second trimester: Secondary | ICD-10-CM

## 2017-12-22 DIAGNOSIS — D251 Intramural leiomyoma of uterus: Secondary | ICD-10-CM | POA: Insufficient documentation

## 2017-12-22 DIAGNOSIS — O35BXX0 Maternal care for other (suspected) fetal abnormality and damage, fetal cardiac anomalies, not applicable or unspecified: Secondary | ICD-10-CM

## 2017-12-22 DIAGNOSIS — O26879 Cervical shortening, unspecified trimester: Secondary | ICD-10-CM

## 2017-12-22 DIAGNOSIS — O09512 Supervision of elderly primigravida, second trimester: Secondary | ICD-10-CM

## 2017-12-22 DIAGNOSIS — A5901 Trichomonal vulvovaginitis: Secondary | ICD-10-CM

## 2017-12-22 DIAGNOSIS — O358XX0 Maternal care for other (suspected) fetal abnormality and damage, not applicable or unspecified: Secondary | ICD-10-CM

## 2017-12-22 DIAGNOSIS — Z720 Tobacco use: Secondary | ICD-10-CM

## 2017-12-22 DIAGNOSIS — O23592 Infection of other part of genital tract in pregnancy, second trimester: Secondary | ICD-10-CM

## 2017-12-22 DIAGNOSIS — O0992 Supervision of high risk pregnancy, unspecified, second trimester: Secondary | ICD-10-CM

## 2017-12-22 DIAGNOSIS — E669 Obesity, unspecified: Secondary | ICD-10-CM

## 2017-12-22 DIAGNOSIS — O0990 Supervision of high risk pregnancy, unspecified, unspecified trimester: Secondary | ICD-10-CM

## 2017-12-22 DIAGNOSIS — Z8619 Personal history of other infectious and parasitic diseases: Secondary | ICD-10-CM

## 2017-12-22 LAB — POCT URINALYSIS DIP (DEVICE)
Bilirubin Urine: NEGATIVE
GLUCOSE, UA: NEGATIVE mg/dL
Ketones, ur: NEGATIVE mg/dL
Nitrite: NEGATIVE
PROTEIN: NEGATIVE mg/dL
Specific Gravity, Urine: 1.025 (ref 1.005–1.030)
Urobilinogen, UA: 0.2 mg/dL (ref 0.0–1.0)
pH: 6 (ref 5.0–8.0)

## 2017-12-22 NOTE — Progress Notes (Signed)
   PRENATAL VISIT NOTE  Subjective:  Catherine Cannon is a 37 y.o. G1P0 at 24w1 being seen today for initial prenatal visit. She is transferring care from [place], [facility]. Transferring care because we are closer to her home in [place]. She is currently monitored for the following issues for this high-risk pregnancy and has Supervision of high risk pregnancy, antepartum; Cervical shortening affecting pregnancy; Alcohol use affecting pregnancy; Advanced maternal age, 1st pregnancy; Maternal age 98+, primigravida, antepartum, second trimester; Short cervix, second trimester; Trichomonal vaginitis during pregnancy in second trimester; and Fetal cardiac anomaly complicating pregnancy, antepartum, single gestation on their problem list.  - hospitalized this pregnancy for sepsis from presumed multifocal pneumonia, viral illness, was intubated and had ARDS, now feeling back to normal - found out pregnant during that hospitalization - sciatica now worsening - stomach cramping on sides occasionally, comes/goes quickly  - 1/2 ppd, trying to cut down    Contractions: Not present. Vag. Bleeding: None.  Movement: Absent. Denies leaking of fluid.   The following portions of the patient's history were reviewed and updated as appropriate: allergies, current medications, past family history, past medical history, past social history, past surgical history and problem list. Problem list updated.  Objective:   Vitals:   12/22/17 1528  BP: 116/61  Pulse: 77  Weight: 211 lb 4.8 oz (95.8 kg)    Fetal Status: Fetal Heart Rate (bpm): 152 Fundal Height: 24 cm Movement: Absent     General:  Alert, oriented and cooperative. Patient is in no acute distress.  Skin: Skin is warm and dry. No rash noted.   Cardiovascular: Normal heart rate noted  Respiratory: Normal respiratory effort, no problems with respiration noted  Abdomen: Soft, gravid, appropriate for gestational age.  Pain/Pressure: Present     Pelvic:  Cervical exam deferred        Extremities: Normal range of motion.  Edema: None  Mental Status: Normal mood and affect. Normal behavior. Normal judgment and thought content.   Assessment and Plan:  Pregnancy: G1P0 at [redacted]w[redacted]d  Supervision of high risk pregnancy, antepartum -- prenatal record reviewed and UTD  -- counseled on 28-week labs at next visit -- updated dating based on 16-week ultrasound at [place] cervix, second trimester -- recommended to start taking prescribed  Vaginal progesterone   Fetal cardiac anomaly complicating pregnancy, antepartum, single gestation -- Vascular ring seen on anatomy U/S - aberrant subclavian versus abnormal aortic arch -- Fetal ECHO was normal [ ]  peds to have ECHO on day 2-3 of life   Tobacco Use -- 1/2 ppd, counseled on cessation, has nicotine patches at home   Preterm labor symptoms and general obstetric precautions including but not limited to vaginal bleeding, contractions, leaking of fluid and fetal movement were reviewed in detail with the patient. Please refer to After Visit Summary for other counseling recommendations.  Return in about 4 weeks (around 01/19/2018) for HROB.  Future Appointments  Date Time Provider [place]  01/19/2018  8:20 AM WOC-WOCA LAB WOC-WOCA WOC  01/19/2018  9:15 AM Emily Filbert, MD [place], DO

## 2018-01-07 ENCOUNTER — Ambulatory Visit (HOSPITAL_COMMUNITY)
Admission: EM | Admit: 2018-01-07 | Discharge: 2018-01-07 | Disposition: A | Discharge status: Home / Self Care | Payer: Medicaid Other | Attending: Internal Medicine | Admitting: Internal Medicine

## 2018-01-07 ENCOUNTER — Encounter (HOSPITAL_COMMUNITY): Payer: Self-pay

## 2018-01-07 DIAGNOSIS — K0889 Other specified disorders of teeth and supporting structures: Secondary | ICD-10-CM | POA: Diagnosis not present

## 2018-01-07 MED ORDER — AMOXICILLIN 500 MG PO CAPS: 500.0000 mg | ORAL_CAPSULE | Freq: Three times a day (TID) | ORAL | 0 refills | Status: AC

## 2018-01-07 NOTE — ED Triage Notes (Signed)
Pt presents with dental pain on right side and is 5 months pregnant so she has been limited to just being able to take tylenol and the pain is getting worse.

## 2018-01-07 NOTE — Discharge Instructions (Signed)
Please begin amoxicillin every 8 hours for 1 week to clear any infection Tylenol every 6 hours Warm compresses to help with swelling  Follow up if swelling worsening or developing increased pain, not relieved with antibiotic

## 2018-01-08 NOTE — ED Provider Notes (Signed)
[facility]    CSN: [id number] Arrival date & time: 01/07/18  1450     History   Chief Complaint Chief Complaint  Patient presents with  . Dental Pain    HPI Catherine Cannon is a 37 y.o. female approximately 5-1/2 months pregnant, high risk pregnancy, presenting today for evaluation of dental pain.  Patient states that over the past 4 days she has had worsening pain to her right lower jaw.  She has a broken tooth here and of recently has had worsening pain.  She has used Tylenol sparingly.  Has noticed some swelling.  Denies fevers.  Denies difficulty swallowing.  Denies difficulty moving neck.  HPI  Past Medical History:  Diagnosis Date  . Acute respiratory failure with hypoxia ([place]) 10/16/2017  . ARDS (adult respiratory distress syndrome) ([place]) 10/19/2017  . Multifocal pneumonia 10/16/2017  . Sepsis ([place]) 10/16/2017    Patient Active Problem List   Diagnosis Date Noted  . Obesity 12/22/2017  . History of sepsis this pregnancy 12/22/2017  . Tobacco use 12/22/2017  . 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 Supervision of high risk pregnancy, antepartum 12/13/2017  . Cervical shortening affecting pregnancy 12/13/2017  . 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 Trichomonal vaginitis during pregnancy in second trimester 11/15/2017  . Maternal age 24+, primigravida, antepartum, second trimester 11/11/2017  . Fetal cardiac anomaly complicating pregnancy, antepartum, single gestation 11/11/2017    History reviewed. No pertinent surgical history.  OB History    Gravida  1   Para      Term      Preterm      AB      Living        SAB      TAB      Ectopic      Multiple      Live Births               Home Medications    Prior to Admission medications   Medication Sig Start Date End Date Taking? Authorizing Provider  amoxicillin (AMOXIL) 500 MG capsule Take 1 capsule (500 mg total) by mouth 3 (three) times daily for 7  days. 01/07/18 01/14/18  Threasa Kinch C, PA-C  aspirin 81 MG chewable tablet Chew 1 tablet by mouth. 10/30/17 10/30/18  [provider]  hydrocortisone 2.5 % cream Apply 1 application topically. 11/24/17 11/24/18  [provider]  Melatonin 3 MG TABS Take 1 tablet by mouth. 10/29/17 10/29/18  [provider]  nicotine (NICODERM CQ - DOSED IN MG/24 HOURS) 21 mg/24hr patch Place 1 patch onto the skin. 10/29/17 01/31/18  [provider]  Prenatal Vit-Fe Fumarate-FA (PNV PRENATAL PLUS MULTIVITAMIN) 27-1 MG TABS Take 1 tablet by mouth. 10/30/17 10/30/18  [provider]  progesterone (PROMETRIUM) 200 MG capsule Take 1 capsule by mouth. 11/24/17 11/24/18  [provider]    Family History Family History  Problem Relation Age of Onset  . Asthma Mother   . Cancer Other     Social History Social History   Tobacco Use  . Smoking status: Current Every Day Smoker    Packs/day: 1.00    Types: Cigarettes  . Smokeless tobacco: Never Used  Substance Use Topics  . Alcohol use: Yes    Comment: occ   . Drug use: Yes    Types: Cocaine     Allergies   Patient has no known allergies.   Review of Systems Review of Systems  Constitutional: Negative for activity change, appetite change, chills, fatigue and fever.  HENT: Positive for dental problem. Negative for congestion, ear pain, rhinorrhea, sinus pressure, sore throat and trouble swallowing.   Eyes: Negative for discharge and redness.  Respiratory: Negative for cough, chest tightness and shortness of breath.   Cardiovascular: Negative for chest pain.  Gastrointestinal: Negative for abdominal pain, diarrhea, nausea and vomiting.  Musculoskeletal: Negative for myalgias.  Skin: Negative for rash.  Neurological: Negative for dizziness, light-headedness and headaches.     Physical Exam Triage Vital Signs ED Triage Vitals [01/07/18 1553]  Enc Vitals Group     BP 138/76     Pulse Rate 78     Resp 20       Temp 98 F (36.7 C)     Temp Source Oral     SpO2 98 %     Weight      Height      Head Circumference      Peak Flow      Pain Score 8     Pain Loc      Pain Edu?      Excl. in [place]?    No data found.  Updated Vital Signs BP 138/76 (BP Location: Right Arm)   Pulse 78   Temp 98 F (36.7 C) (Oral)   Resp 20   LMP 07/14/2017 (Exact Date) Comment: irregular  SpO2 98%   Visual Acuity Right Eye Distance:   Left Eye Distance:   Bilateral Distance:    Right Eye Near:   Left Eye Near:    Bilateral Near:     Physical Exam  Constitutional: She appears well-developed and well-nourished. No distress.  HENT:  Head: Normocephalic and atraumatic.  Fractured lower posterior right molar, mild increase in gingival swelling and erythema, tender to palpation around this tooth  Mild swelling to right lower mandibular area on face  Nontender to palpation to soft palate below the tongue  Eyes: Conjunctivae are normal.  Neck: Neck supple.  Full active range of motion of neck, no neck swelling or erythema  Cardiovascular: Normal rate and regular rhythm.  No murmur heard. Pulmonary/Chest: Effort normal and breath sounds normal. No respiratory distress.  Abdominal: Soft. There is no tenderness.  Musculoskeletal: She exhibits no edema.  Neurological: She is alert.  Skin: Skin is warm and dry.  Psychiatric: She has a normal mood and affect.  Nursing note and vitals reviewed.    UC Treatments / Results  Labs (all labs ordered are listed, but only abnormal results are displayed) Labs Reviewed - No data to display  EKG None  Radiology No results found.  Procedures Procedures (including critical care time)  Medications Ordered in UC Medications - No data to display  Initial Impression / Assessment and Plan / UC Course  I have reviewed the triage vital signs and the nursing notes.  Pertinent labs & imaging results that were available during my care of the patient were  reviewed by me and considered in my medical decision making (see chart for details).     Patient with dental pain, possible underlying infection/abscess, will begin patient on amoxicillin.  Continue Tylenol.  Warm compresses.  Continue to monitor,Discussed strict return precautions. Patient verbalized understanding and is agreeable with plan.  Final Clinical Impressions(s) / UC Diagnoses   Final diagnoses:  Pain, dental     Discharge Instructions     Please begin amoxicillin every 8 hours for 1 week to clear any infection Tylenol  every 6 hours Warm compresses to help with swelling  Follow up if swelling worsening or developing increased pain, not relieved with antibiotic   ED Prescriptions    Medication Sig Dispense Auth. Provider   amoxicillin (AMOXIL) 500 MG capsule Take 1 capsule (500 mg total) by mouth 3 (three) times daily for 7 days. 21 capsule Christin Mccreedy C, PA-C     Controlled Substance Prescriptions [place] Controlled Substance Registry consulted? Not Applicable   Janith Lima, [place] 01/08/18 1771

## 2018-01-11 ENCOUNTER — Telehealth: Payer: Self-pay

## 2018-01-11 NOTE — Telephone Encounter (Signed)
Pt called stating that she has a chest cold and wants to know what to take.  Informed pt that she can take Robitussin plain/DM or even Mucinex DM if she is having some congestion.  Pt stated understanding with no further questions.

## 2018-01-19 ENCOUNTER — Other Ambulatory Visit (HOSPITAL_COMMUNITY)
Admission: RE | Admit: 2018-01-19 | Discharge: 2018-01-19 | Disposition: A | Discharge status: Home / Self Care | Payer: Medicaid Other | Source: Ambulatory Visit | Attending: Obstetrics & Gynecology | Admitting: Obstetrics & Gynecology

## 2018-01-19 ENCOUNTER — Ambulatory Visit (INDEPENDENT_AMBULATORY_CARE_PROVIDER_SITE_OTHER): Payer: Medicaid Other | Admitting: Obstetrics & Gynecology

## 2018-01-19 ENCOUNTER — Other Ambulatory Visit: Payer: Medicaid Other

## 2018-01-19 VITALS — BP 119/65 | HR 79 | Wt 214.9 lb

## 2018-01-19 DIAGNOSIS — A5901 Trichomonal vulvovaginitis: Secondary | ICD-10-CM | POA: Diagnosis present

## 2018-01-19 DIAGNOSIS — O0990 Supervision of high risk pregnancy, unspecified, unspecified trimester: Secondary | ICD-10-CM

## 2018-01-19 DIAGNOSIS — O0993 Supervision of high risk pregnancy, unspecified, third trimester: Secondary | ICD-10-CM

## 2018-01-19 DIAGNOSIS — O23592 Infection of other part of genital tract in pregnancy, second trimester: Principal | ICD-10-CM

## 2018-01-19 DIAGNOSIS — O23593 Infection of other part of genital tract in pregnancy, third trimester: Secondary | ICD-10-CM

## 2018-01-19 DIAGNOSIS — Z3A28 28 weeks gestation of pregnancy: Secondary | ICD-10-CM

## 2018-01-19 NOTE — Progress Notes (Signed)
Ultrasound scheduled for Monday, December 9th, at 0900.

## 2018-01-19 NOTE — Patient Instructions (Signed)

## 2018-01-19 NOTE — Progress Notes (Signed)
   PRENATAL VISIT NOTE  Subjective:  Catherine Cannon is a 37 y.o. G1P0 at [redacted]w[redacted]d being seen today for ongoing prenatal care.  She is currently monitored for the following issues for this high-risk pregnancy and has Supervision of high risk pregnancy, antepartum; Cervical shortening affecting pregnancy; Alcohol use affecting pregnancy; Maternal age 43+, primigravida, antepartum, second trimester; Trichomonal vaginitis during pregnancy in second trimester; Fetal cardiac anomaly complicating pregnancy, antepartum, single gestation; Obesity; History of sepsis this pregnancy; Tobacco use; and Intramural uterine fibroid - Retroplacental  on their problem list.  Patient reports cold sx.  Contractions: Not present. Vag. Bleeding: None.  Movement: Present. Denies leaking of fluid.   The following portions of the patient's history were reviewed and updated as appropriate: allergies, current medications, past family history, past medical history, past social history, past surgical history and problem list. Problem list updated.  Objective:   Vitals:   01/19/18 0843  BP: 119/65  Pulse: 79  Weight: 214 lb 14.4 oz (97.5 kg)    Fetal Status: Fetal Heart Rate (bpm): 133   Movement: Present     General:  Alert, oriented and cooperative. Patient is in no acute distress.  Skin: Skin is warm and dry. No rash noted.   Cardiovascular: Normal heart rate noted  Respiratory: Normal respiratory effort, no problems with respiration noted, few exp wheezes no ronchi  Abdomen: Soft, gravid, appropriate for gestational age.  Pain/Pressure: Absent     Pelvic: Cervical exam deferred        Extremities: Normal range of motion.  Edema: None  Mental Status: Normal mood and affect. Normal behavior. Normal judgment and thought content.   Assessment and Plan:  Pregnancy: G1P0 at [redacted]w[redacted]d  1. Supervision of high risk pregnancy, antepartum F/u US for AMA - [place] MFM OB FOLLOW UP; Future  2. Trichomonal vaginitis during  pregnancy in second trimester TOC - Cervicovaginal ancillary only( [place])  Preterm labor symptoms and general obstetric precautions including but not limited to vaginal bleeding, contractions, leaking of fluid and fetal movement were reviewed in detail with the patient. Please refer to After Visit Summary for other counseling recommendations.  Return in about 2 weeks (around 02/02/2018).  No future appointments.  Emeterio Reeve, MD

## 2018-01-20 LAB — CBC
HEMATOCRIT: 35.7 % (ref 34.0–46.6)
Hemoglobin: 12.2 g/dL (ref 11.1–15.9)
MCH: 30.3 pg (ref 26.6–33.0)
MCHC: 34.2 g/dL (ref 31.5–35.7)
MCV: 89 fL (ref 79–97)
Platelets: 349 10*3/uL (ref 150–450)
RBC: 4.03 x10E6/uL (ref 3.77–5.28)
RDW: 12.3 % (ref 12.3–15.4)
WBC: 15 10*3/uL — ABNORMAL HIGH (ref 3.4–10.8)

## 2018-01-20 LAB — GLUCOSE TOLERANCE, 2 HOURS W/ 1HR
GLUCOSE, 2 HOUR: 103 mg/dL (ref 65–152)
GLUCOSE, FASTING: 81 mg/dL (ref 65–91)
Glucose, 1 hour: 144 mg/dL (ref 65–179)

## 2018-01-20 LAB — RPR: RPR: NONREACTIVE

## 2018-01-20 LAB — HIV ANTIBODY (ROUTINE TESTING W REFLEX): HIV SCREEN 4TH GENERATION: NONREACTIVE

## 2018-01-21 LAB — CERVICOVAGINAL ANCILLARY ONLY: Trichomonas: POSITIVE — AB

## 2018-01-25 ENCOUNTER — Encounter (HOSPITAL_COMMUNITY): Payer: Self-pay

## 2018-01-26 ENCOUNTER — Other Ambulatory Visit: Payer: Self-pay | Admitting: Obstetrics & Gynecology

## 2018-01-26 NOTE — Progress Notes (Signed)
Flagyl 2 g for trichomonas

## 2018-01-27 ENCOUNTER — Telehealth: Payer: Self-pay | Admitting: *Deleted

## 2018-01-27 DIAGNOSIS — O23592 Infection of other part of genital tract in pregnancy, second trimester: Principal | ICD-10-CM

## 2018-01-27 DIAGNOSIS — A5901 Trichomonal vulvovaginitis: Secondary | ICD-10-CM

## 2018-01-27 MED ORDER — METRONIDAZOLE 500 MG PO TABS: 2000.0000 mg | ORAL_TABLET | Freq: Once | ORAL | 0 refills | Status: AC

## 2018-01-27 NOTE — Telephone Encounter (Signed)
Called Catherine Cannon and notified her wet prep showed + trich and that both her and partner need to be treated and I can sent in rx for her and refill that he can take unless he has allergies. He does have allergies and so I recommended she have him call his PCP or health department for treatment. I also advised no intimate contact until both have been treated x 2 weeks. She voices understanding.

## 2018-01-27 NOTE — Telephone Encounter (Signed)
-----   Message from Woodroe Mode, MD sent at 01/26/2018  9:26 PM EST ----- Please call Flagyl 2 g PO single dose for trichomonas

## 2018-01-31 ENCOUNTER — Other Ambulatory Visit (HOSPITAL_COMMUNITY): Payer: Medicaid Other

## 2018-02-01 ENCOUNTER — Encounter (HOSPITAL_COMMUNITY): Payer: Self-pay

## 2018-02-01 ENCOUNTER — Ambulatory Visit (HOSPITAL_COMMUNITY)
Admission: RE | Admit: 2018-02-01 | Discharge: 2018-02-01 | Disposition: A | Discharge status: Home / Self Care | Payer: Medicaid Other | Source: Ambulatory Visit | Attending: Obstetrics & Gynecology | Admitting: Obstetrics & Gynecology

## 2018-02-01 DIAGNOSIS — O99213 Obesity complicating pregnancy, third trimester: Secondary | ICD-10-CM

## 2018-02-01 DIAGNOSIS — O99333 Smoking (tobacco) complicating pregnancy, third trimester: Secondary | ICD-10-CM | POA: Insufficient documentation

## 2018-02-01 DIAGNOSIS — O26879 Cervical shortening, unspecified trimester: Secondary | ICD-10-CM

## 2018-02-01 DIAGNOSIS — O09513 Supervision of elderly primigravida, third trimester: Secondary | ICD-10-CM | POA: Insufficient documentation

## 2018-02-01 DIAGNOSIS — O2690 Pregnancy related conditions, unspecified, unspecified trimester: Secondary | ICD-10-CM | POA: Diagnosis not present

## 2018-02-01 DIAGNOSIS — O0990 Supervision of high risk pregnancy, unspecified, unspecified trimester: Secondary | ICD-10-CM

## 2018-02-01 DIAGNOSIS — O99313 Alcohol use complicating pregnancy, third trimester: Secondary | ICD-10-CM | POA: Insufficient documentation

## 2018-02-01 DIAGNOSIS — Z3A30 30 weeks gestation of pregnancy: Secondary | ICD-10-CM | POA: Diagnosis not present

## 2018-02-01 DIAGNOSIS — Z363 Encounter for antenatal screening for malformations: Secondary | ICD-10-CM

## 2018-02-02 ENCOUNTER — Ambulatory Visit (INDEPENDENT_AMBULATORY_CARE_PROVIDER_SITE_OTHER): Payer: Medicaid Other | Admitting: Obstetrics & Gynecology

## 2018-02-02 ENCOUNTER — Other Ambulatory Visit (HOSPITAL_COMMUNITY): Payer: Self-pay | Admitting: *Deleted

## 2018-02-02 VITALS — BP 123/75 | HR 89 | Wt 217.3 lb

## 2018-02-02 DIAGNOSIS — O0993 Supervision of high risk pregnancy, unspecified, third trimester: Secondary | ICD-10-CM | POA: Diagnosis not present

## 2018-02-02 DIAGNOSIS — O09513 Supervision of elderly primigravida, third trimester: Secondary | ICD-10-CM | POA: Diagnosis not present

## 2018-02-02 DIAGNOSIS — O358XX0 Maternal care for other (suspected) fetal abnormality and damage, not applicable or unspecified: Secondary | ICD-10-CM | POA: Diagnosis not present

## 2018-02-02 DIAGNOSIS — O26873 Cervical shortening, third trimester: Secondary | ICD-10-CM

## 2018-02-02 DIAGNOSIS — O35BXX0 Maternal care for other (suspected) fetal abnormality and damage, fetal cardiac anomalies, not applicable or unspecified: Secondary | ICD-10-CM

## 2018-02-02 DIAGNOSIS — O26879 Cervical shortening, unspecified trimester: Secondary | ICD-10-CM

## 2018-02-02 DIAGNOSIS — Z23 Encounter for immunization: Secondary | ICD-10-CM

## 2018-02-02 DIAGNOSIS — O0990 Supervision of high risk pregnancy, unspecified, unspecified trimester: Secondary | ICD-10-CM

## 2018-02-02 DIAGNOSIS — O09512 Supervision of elderly primigravida, second trimester: Secondary | ICD-10-CM

## 2018-02-02 DIAGNOSIS — E669 Obesity, unspecified: Secondary | ICD-10-CM

## 2018-02-02 NOTE — Addendum Note (Signed)
Addended by: Dolores Hoose on: 02/02/2018 04:41 PM   Modules accepted: Orders

## 2018-02-02 NOTE — Patient Instructions (Signed)

## 2018-02-02 NOTE — Progress Notes (Signed)
   PRENATAL VISIT NOTE  Subjective:  Catherine Cannon is a 37 y.o. G1P0 at [redacted]w[redacted]d being seen today for ongoing prenatal care.  She is currently monitored for the following issues for this high-risk pregnancy and has Supervision of high risk pregnancy, antepartum; Cervical shortening affecting pregnancy; Alcohol use affecting pregnancy; Maternal age 76+, primigravida, antepartum, second trimester; Trichomonal vaginitis during pregnancy in second trimester; Fetal cardiac anomaly complicating pregnancy, antepartum, single gestation; Obesity; History of sepsis this pregnancy; Tobacco use; and Intramural uterine fibroid - Retroplacental  on their problem list.  Patient reports both sides with carpal tunnel symptoms and sciatica on both sides. Right side worse.  Contractions: Irritability. Vag. Bleeding: None.  Movement: Present. Denies leaking of fluid.   The following portions of the patient's history were reviewed and updated as appropriate: allergies, current medications, past family history, past medical history, past social history, past surgical history and problem list. Problem list updated.  Objective:   Vitals:   02/02/18 1616  BP: 123/75  Pulse: 89  Weight: 217 lb 4.8 oz (98.6 kg)    Fetal Status:     Movement: Present     General:  Alert, oriented and cooperative. Patient is in no acute distress.  Skin: Skin is warm and dry. No rash noted.   Cardiovascular: Normal heart rate noted  Respiratory: Normal respiratory effort, no problems with respiration noted  Abdomen: Soft, gravid, appropriate for gestational age.  Pain/Pressure: Absent     Pelvic: Cervical exam deferred        Extremities: Normal range of motion.  Edema: Trace  Mental Status: Normal mood and affect. Normal behavior. Normal judgment and thought content.   Assessment and Plan:  Pregnancy: G1P0 at [redacted]w[redacted]d  1. Cervical shortening affecting pregnancy   2. Fetal cardiac anomaly complicating pregnancy, antepartum, single  gestation -normal fetal echo  3. Supervision of high risk pregnancy, antepartum   4. Maternal age 54+, primigravida, antepartum, second trimester - [facility]  5. Class 1 obesity in adult, unspecified BMI, unspecified obesity type, unspecified whether serious comorbidity present - has lost some undefined amount of weight, she cannot remember her pre pregnancy weight  6. Sciatica- I have taught her exercises and rec'd a chiro prn 7. Carpal tunnel- rec'd wrist braces  Preterm labor symptoms and general obstetric precautions including but not limited to vaginal bleeding, contractions, leaking of fluid and fetal movement were reviewed in detail with the patient. Please refer to After Visit Summary for other counseling recommendations.  No follow-ups on file.  Future Appointments  Date Time Provider [place]  02/22/2018  3:45 PM WH-MFC [place] 2 WH-MFCUS MFC-US    Emily Filbert, MD

## 2018-02-03 ENCOUNTER — Other Ambulatory Visit: Payer: Self-pay

## 2018-02-03 ENCOUNTER — Ambulatory Visit (HOSPITAL_COMMUNITY)
Admission: EM | Admit: 2018-02-03 | Discharge: 2018-02-03 | Disposition: A | Discharge status: Home / Self Care | Payer: MEDICAID | Attending: Family Medicine | Admitting: Family Medicine

## 2018-02-03 ENCOUNTER — Encounter (HOSPITAL_COMMUNITY): Payer: Self-pay

## 2018-02-03 DIAGNOSIS — H9201 Otalgia, right ear: Secondary | ICD-10-CM | POA: Insufficient documentation

## 2018-02-03 DIAGNOSIS — K047 Periapical abscess without sinus: Secondary | ICD-10-CM | POA: Insufficient documentation

## 2018-02-03 MED ORDER — PENICILLIN V POTASSIUM 500 MG PO TABS: 500.0000 mg | ORAL_TABLET | Freq: Four times a day (QID) | ORAL | 0 refills | Status: AC

## 2018-02-03 NOTE — ED Triage Notes (Signed)
Pt cc she has a right ear dis comfort and toothache x 4 days. Pt is  6 mo pregnant.

## 2018-02-03 NOTE — Discharge Instructions (Addendum)
We will go ahead and treat you for dental infection today with penicillin Please follow-up with the dentist for further evaluation and treatment You can take Tylenol extra strength as needed for pain Follow up as needed for continued or worsening symptoms

## 2018-02-03 NOTE — ED Provider Notes (Signed)
[facility]    CSN: [id number] Arrival date & time: 02/03/18  1348     History   Chief Complaint Chief Complaint  Patient presents with  . Otalgia    HPI Catherine Cannon is a 37 y.o. female.   Patient is a 37 year old female that presents with recurrent dental infection and pain.  She was seen here on 01/07/2018 and treated for dental infection.  Reports that the infection did improve but approximately 4 days ago she started having right lower dental pain, swelling and right ear pain similar to previous.  She does not currently have a dentist.  She is also had some mild facial swelling and tenderness behind the ear.  She denies any trouble swallowing or opening her mouth.  She denies any fever, chills, body aches, night sweats.  ROS per HPI      Past Medical History:  Diagnosis Date  . Acute respiratory failure with hypoxia ([place]) 10/16/2017  . ARDS (adult respiratory distress syndrome) ([place]) 10/19/2017  . Multifocal pneumonia 10/16/2017  . Sepsis ([place]) 10/16/2017    Patient Active Problem List   Diagnosis Date Noted  . Obesity 12/22/2017  . History of sepsis this pregnancy 12/22/2017  . Tobacco use 12/22/2017  . 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 Supervision of high risk pregnancy, antepartum 12/13/2017  . Cervical shortening affecting pregnancy 12/13/2017  . 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 Trichomonal vaginitis during pregnancy in second trimester 11/15/2017  . Maternal age 37+, primigravida, antepartum, second trimester 11/11/2017  . Fetal cardiac anomaly complicating pregnancy, antepartum, single gestation 11/11/2017    Past Surgical History:  Procedure Laterality Date  . NO PAST SURGERIES      OB History    Gravida  1   Para      Term      Preterm      AB      Living  0     SAB      TAB      Ectopic      Multiple      Live Births               Home Medications    Prior to Admission  medications   Medication Sig Start Date End Date Taking? Authorizing Provider  aspirin 81 MG chewable tablet Chew 1 tablet by mouth. 10/30/17 10/30/18  [provider]  hydrocortisone 2.5 % cream Apply 1 application topically. 11/24/17 11/24/18  [provider]  Melatonin 3 MG TABS Take 1 tablet by mouth. 10/29/17 10/29/18  [provider]  Nicotine (Bono TD) Place onto the skin.    [provider]  penicillin v potassium (VEETID) 500 MG tablet Take 1 tablet (500 mg total) by mouth 4 (four) times daily for 10 days. 02/03/18 02/13/18  Orvan July, NP  Prenatal Vit-Fe Fumarate-FA (PNV PRENATAL PLUS MULTIVITAMIN) 27-1 MG TABS Take 1 tablet by mouth. 10/30/17 10/30/18  [provider]  progesterone (PROMETRIUM) 200 MG capsule Take 1 capsule by mouth. 11/24/17 11/24/18  [provider]    Family History Family History  Problem Relation Age of Onset  . Asthma Mother   . Cancer Other     Social History Social History   Tobacco Use  . Smoking status: Current Every Day Smoker    Packs/day: 0.50    Types: Cigarettes  . Smokeless tobacco: Never Used  Substance Use Topics  . Alcohol use: Not Currently  Comment: occ   . Drug use: Not Currently    Types: Cocaine     Allergies   Patient has no known allergies.   Review of Systems Review of Systems   Physical Exam Triage Vital Signs ED Triage Vitals  Enc Vitals Group     BP 02/03/18 1410 126/73     Pulse Rate 02/03/18 1410 87     Resp 02/03/18 1410 18     Temp 02/03/18 1410 98.1 F (36.7 C)     Temp Source 02/03/18 1410 Oral     SpO2 02/03/18 1410 98 %     Weight 02/03/18 1408 217 lb (98.4 kg)     Height --      Head Circumference --      Peak Flow --      Pain Score 02/03/18 1500 3     Pain Loc --      Pain Edu? --      Excl. in [place]? --    No data found.  Updated Vital Signs BP 126/73 (BP Location: Right Arm)   Pulse 87   Temp 98.1 F (36.7 C) (Oral)   Resp 18   Wt  217 lb (98.4 kg)   LMP 07/14/2017 (Exact Date) Comment: irregular  SpO2 98%   BMI 32.05 kg/m   Visual Acuity Right Eye Distance:   Left Eye Distance:   Bilateral Distance:    Right Eye Near:   Left Eye Near:    Bilateral Near:     Physical Exam Vitals signs and nursing note reviewed.  Constitutional:      Appearance: Normal appearance.  HENT:     Head: Normocephalic.     Right Ear: Tympanic membrane and ear canal normal.     Left Ear: Tympanic membrane and ear canal normal.     Nose: Nose normal.     Mouth/Throat:     Mouth: Mucous membranes are moist.      Comments: Swelling and erythema to right lower gingiva with tenderness to palpation. Right buccal mucosa swelling Mild right facial swelling to mandibular area. No trismus Eyes:     Conjunctiva/sclera: Conjunctivae normal.  Neck:     Musculoskeletal: Normal range of motion and neck supple.  Pulmonary:     Effort: Pulmonary effort is normal.  Lymphadenopathy:     Cervical: No cervical adenopathy.  Neurological:     Mental Status: She is alert.      UC Treatments / Results  Labs (all labs ordered are listed, but only abnormal results are displayed) Labs Reviewed - No data to display  EKG None  Radiology [place] Mfm Ob Detail +14 Wk  Result Date: 02/01/2018 ----------------------------------------------------------------------  OBSTETRICS REPORT                       (Signed Final 02/01/2018 05:05 pm) ---------------------------------------------------------------------- Patient Info  ID #:       [id number]                          D.O.B.:  06-17-1980 (37 yrs)  Name:       Catherine Cannon                  Visit Date: 02/01/2018 03:22 pm ---------------------------------------------------------------------- Performed By  Performed By:     Hubert Azure          Ref. Address:     [address]  [place], [place]  Attending:        Pricilla Handler MD       Location:         [facility]  Referred By:      [place] Mode                    MD ---------------------------------------------------------------------- Orders   #  Description                          Code         Ordered By   1  [place] MFM OB DETAIL +14 WK              76811.01     Emeterio Reeve  ----------------------------------------------------------------------   #  Order #                    Accession #                 Episode #   1  [id number]                  [id number]                  [id number]  ---------------------------------------------------------------------- Indications   Encounter for antenatal screening for          Z36.3   malformations   Obesity complicating pregnancy, third          O99.213   trimester   Smoking complicating pregnancy, third          O99.333   trimester   Advanced maternal age primigravida 42+,        O39.513   third trimester (low risk NIPs)   Medical complication of pregnancy (sepsis      O26.90   09/2017)   Alcohol use complicating pregnancy, third      O99.313   trimester   [redacted] weeks gestation of pregnancy                Z3A.30  ---------------------------------------------------------------------- Vital Signs  Weight (lb): 218                               Height:        5'9"  BMI:  32.19 ---------------------------------------------------------------------- Fetal Evaluation  Num Of Fetuses:         1  Fetal Heart Rate(bpm):  141  Cardiac Activity:       Observed  Presentation:           Cephalic  Placenta:               Anterior  P. Cord Insertion:      Visualized, central  Amniotic Fluid  AFI FV:      Within normal limits  AFI Sum(cm)     %Tile       Largest Pocket(cm)  10.43           17          3.94  RUQ(cm)       RLQ(cm)       LUQ(cm)        LLQ(cm)  3.94          3.45          1.13           1.91  ---------------------------------------------------------------------- Biometry  BPD:      76.7  mm     G. Age:  30w 5d         63  %    CI:        76.86   %    70 - 86                                                          FL/HC:      19.0   %    19.2 - 21.4  HC:      277.1  mm     G. Age:  30w 2d         24  %    HC/AC:      1.02        0.99 - 1.21  AC:      270.4  mm     G. Age:  31w 1d         77  %    FL/BPD:     68.6   %    71 - 87  FL:       52.6  mm     G. Age:  28w 0d        < 3  %    FL/AC:      19.5   %    20 - 24  HUM:      48.4  mm     G. Age:  28w 3d         21  %  CER:      37.2  mm     G. Age:  32w 1d         75  %  LV:        5.2  mm  Est. FW:    1500  gm      3 lb 5 oz     54  % ---------------------------------------------------------------------- OB History  Gravidity:    1 ---------------------------------------------------------------------- Gestational Age  LMP:           28w 6d        Date:  07/14/17  EDD:   04/20/18  U/S Today:     30w 0d                                        EDD:   04/12/18  Best:          30w 0d     Det. By:  Previous Ultrasound      EDD:   04/12/18                                      (10/27/17) ---------------------------------------------------------------------- Anatomy  Cranium:               Appears normal         Aortic Arch:            Appears normal  Cavum:                 Appears normal         Ductal Arch:            Not well visualized  Ventricles:            Appears normal         Diaphragm:              Appears normal  Choroid Plexus:        Appears normal         Stomach:                Appears normal, left                                                                        sided  Cerebellum:            Appears normal         Abdomen:                Appears normal  Posterior Fossa:       Appears normal         Abdominal Wall:         Appears nml (cord                                                                        insert, abd  wall)  Nuchal Fold:           Not applicable (>81    Cord Vessels:           Appears normal ([redacted]                         wks GA)  vessel cord)  Face:                  Profile nl; orbits not Kidneys:                Appear normal                         well visualized  Lips:                  Appears normal         Bladder:                Appears normal  Thoracic:              Appears normal         Spine:                  Appears normal  Heart:                 Appears normal         Upper Extremities:      RUE NML; LT NWS                         (4CH, axis, and                         situs)  RVOT:                  Appears normal         Lower Extremities:      Appears normal  LVOT:                  Appears normal  Other:  Fetus appears to be a female. Heels visualized. Nasal bone visualized.          Technicallly difficult due to advanced GA and maternal habitus. ---------------------------------------------------------------------- Impression  Live appropriately grown 3rd trimester singleton gestation  with normal amniotic fluid volume. ---------------------------------------------------------------------- Recommendations  Follow up as scheduled. ----------------------------------------------------------------------                 Pricilla Handler, MD Electronically Signed Final Report   02/01/2018 05:05 pm ----------------------------------------------------------------------   Procedures Procedures (including critical care time)  Medications Ordered in UC Medications - No data to display  Initial Impression / Assessment and Plan / UC Course  I have reviewed the triage vital signs and the nursing notes.  Pertinent labs & imaging results that were available during my care of the patient were reviewed by me and considered in my medical decision making (see chart for details).     We will go ahead and treat again today for dental infection Patient already has dental  resources from last visit Instructed to go ahead and call to schedule an appointment due to recurrence of this infection Instructed on how serious these infections can be Patient is 6 months pregnant If she develops any worsening symptoms to include more severe swelling, trouble opening her mouth or swallowing she is to go the ER Patient is understanding and agreeable to plan  Final Clinical Impressions(s) / UC Diagnoses   Final diagnoses:  Otalgia of right ear  Chronic dental infection     Discharge Instructions     We will go ahead and treat you for dental infection today with penicillin Please follow-up with the dentist for further  evaluation and treatment You can take Tylenol extra strength as needed for pain Follow up as needed for continued or worsening symptoms     ED Prescriptions    Medication Sig Dispense Auth. Provider   penicillin v potassium (VEETID) 500 MG tablet Take 1 tablet (500 mg total) by mouth 4 (four) times daily for 10 days. 40 tablet Loura Halt A, NP     Controlled Substance Prescriptions [place] Controlled Substance Registry consulted? Not Applicable   Orvan July, NP 02/03/18 1620

## 2018-02-22 ENCOUNTER — Ambulatory Visit (HOSPITAL_COMMUNITY)
Admission: RE | Admit: 2018-02-22 | Discharge: 2018-02-22 | Disposition: A | Discharge status: Home / Self Care | Payer: Medicaid Other | Source: Ambulatory Visit | Attending: Obstetrics & Gynecology | Admitting: Obstetrics & Gynecology

## 2018-02-22 ENCOUNTER — Encounter (HOSPITAL_COMMUNITY): Payer: Self-pay

## 2018-02-22 DIAGNOSIS — O09513 Supervision of elderly primigravida, third trimester: Secondary | ICD-10-CM | POA: Diagnosis not present

## 2018-02-22 DIAGNOSIS — O99313 Alcohol use complicating pregnancy, third trimester: Secondary | ICD-10-CM

## 2018-02-22 DIAGNOSIS — O0990 Supervision of high risk pregnancy, unspecified, unspecified trimester: Secondary | ICD-10-CM

## 2018-02-22 DIAGNOSIS — Z3A33 33 weeks gestation of pregnancy: Secondary | ICD-10-CM

## 2018-02-22 DIAGNOSIS — O26879 Cervical shortening, unspecified trimester: Secondary | ICD-10-CM | POA: Diagnosis present

## 2018-02-22 DIAGNOSIS — O2693 Pregnancy related conditions, unspecified, third trimester: Secondary | ICD-10-CM

## 2018-02-22 DIAGNOSIS — O99333 Smoking (tobacco) complicating pregnancy, third trimester: Secondary | ICD-10-CM

## 2018-02-22 DIAGNOSIS — O9213 Cracked nipple associated with lactation: Secondary | ICD-10-CM | POA: Diagnosis not present

## 2018-02-23 NOTE — L&D Delivery Note (Addendum)
Patient is a 38 y.o. now G1P1 s/p NSVD at [redacted]w[redacted]d, who was admitted for [place].  Delivery Note At 5:09 AM a viable female was delivered via Vaginal, Spontaneous (Presentation: LOA; nuchal cord present).  APGAR: 8, 9; weight  pending.   Placenta status: required manual extraction.  Cord: 3 vessel cord with the following complications: retained placenta.  Cord pH: pending  Anesthesia:  Epidural Episiotomy: None Lacerations: 3rd degree Suture Repair: 2.0 3.0 vicryl Est. Blood Loss (mL):    Head delivered LOA with nuchal cord present that was easily reduced after delivery. Shoulder and body delivered in usual fashion. Infant with spontaneous cry, placed on mother's abdomen, dried and bulb suctioned. Cord clamped x 2 after 1-minute delay, and cut by family member. Cord blood drawn. Placenta delivered in separate pieces and required manual extraction at bedside. Fundus firm with massage and Pitocin. Perineum inspected and found to have 3A degree laceration, which was repaired with 2-0 and 3-0 Vicryl with good hemostasis achieved.  Mom to postpartum.  Baby to Couplet care / Skin to Skin.  Nuala Alpha, DO [facility], PGY-2 04/19/2018, 6:03 AM  OB Attending As noted above. 3A laceration noted, Repaired in the usual layer fashion with 2/0 and 3/0 Vicryl. Rectal exam confirmed good tone and intact rectum. Pt tolerated repair well All counts x 2 correct.  Arlina Robes, MD

## 2018-02-24 ENCOUNTER — Other Ambulatory Visit (HOSPITAL_COMMUNITY): Payer: Self-pay | Admitting: *Deleted

## 2018-02-24 ENCOUNTER — Ambulatory Visit (INDEPENDENT_AMBULATORY_CARE_PROVIDER_SITE_OTHER): Payer: Medicaid Other | Admitting: Obstetrics & Gynecology

## 2018-02-24 VITALS — BP 134/83 | HR 97 | Wt 222.3 lb

## 2018-02-24 DIAGNOSIS — O26873 Cervical shortening, third trimester: Secondary | ICD-10-CM

## 2018-02-24 DIAGNOSIS — O0990 Supervision of high risk pregnancy, unspecified, unspecified trimester: Secondary | ICD-10-CM

## 2018-02-24 DIAGNOSIS — O0993 Supervision of high risk pregnancy, unspecified, third trimester: Secondary | ICD-10-CM

## 2018-02-24 DIAGNOSIS — O26879 Cervical shortening, unspecified trimester: Secondary | ICD-10-CM

## 2018-02-24 DIAGNOSIS — O09519 Supervision of elderly primigravida, unspecified trimester: Secondary | ICD-10-CM

## 2018-02-24 NOTE — Patient Instructions (Signed)
Places to have your son circumcised:    [facility] [phone] while you are in hospital  [facility] [phone] $244 by 4 wks  [facility] [phone] $175 by 2 wks  [facility] [phone] $250 by 7 days [facility] [phone] $[phone] by 4 wks  These prices sometimes change but are roughly what you can expect to pay. Please call and confirm pricing.   Circumcision is considered an elective/non-medically necessary procedure. There are many reasons parents decide to have their sons circumsized. During the first year of life circumcised males have a reduced risk of urinary tract infections but after this year the rates between circumcised males and uncircumcised males are the same.  It is safe to have your son circumcised outside of the hospital and the places above perform them regularly.   Deciding about Circumcision in Baby Boys  (Up-to-date The Basics)  What is circumcision?  Circumcision is a surgery that removes the skin that covers the tip of the penis, called the "foreskin" Circumcision is usually done when a boy is between 26 and 21 days old. In the [place], circumcision is common. In some other countries, fewer boys are circumcised. Circumcision is a common tradition in some religions.  Should I have my baby boy circumcised?  There is no easy answer. Circumcision has some benefits. But it also has risks. After talking with your doctor, you will have to decide for yourself what is right for your family.  What are the benefits of circumcision?  Circumcised boys seem to have slightly lower rates of: ?Urinary tract infections ?Swelling of the opening at the tip of the penis Circumcised men seem to have slightly lower rates of: ?Urinary tract infections ?Swelling of the opening at the tip of the penis ?Penis  cancer ?HIV and other infections that you catch during sex ?Cervical cancer in the women they have sex with Even so, in the [place], the risks of these problems are small - even in boys and men who have not been circumcised. Plus, boys and men who are not circumcised can reduce these extra risks by: ?Cleaning their penis well ?Using condoms during sex  What are the risks of circumcision?  Risks include: ?Bleeding or infection from the surgery ?Damage to or amputation of the penis ?A chance that the doctor will cut off too much or not enough of the foreskin ?A chance that sex won't feel as good later in life Only about 1 out of every 200 circumcisions leads to problems. There is also a chance that your health insurance won't pay for circumcision.  How is circumcision done in baby boys?  First, the baby gets medicine for pain relief. This might be a cream on the skin or a shot into the base of the penis. Next, the doctor cleans the baby's penis well. Then he or she uses special tools to cut off the foreskin. Finally, the doctor wraps a bandage (called gauze) around the baby's penis. If you have your baby circumcised, his doctor or nurse will give you instructions on how to care for him after the surgery. It is important that you follow those instructions carefully.

## 2018-02-24 NOTE — Progress Notes (Signed)
   PRENATAL VISIT NOTE  Subjective:  Catherine Cannon is a 38 y.o. G1P0 at [redacted]w[redacted]d being seen today for ongoing prenatal care.  She is currently monitored for the following issues for this high-risk pregnancy and has Supervision of high risk pregnancy, antepartum; Cervical shortening affecting pregnancy; Alcohol use affecting pregnancy; Maternal age 65+, primigravida, antepartum, second trimester; Trichomonal vaginitis during pregnancy in second trimester; Fetal cardiac anomaly complicating pregnancy, antepartum, single gestation; Obesity; History of sepsis this pregnancy; Tobacco use; and Intramural uterine fibroid - Retroplacental  on their problem list.  Patient reports no complaints.  Contractions: Irritability. Vag. Bleeding: None.  Movement: Present. Denies leaking of fluid.   The following portions of the patient's history were reviewed and updated as appropriate: allergies, current medications, past family history, past medical history, past social history, past surgical history and problem list. Problem list updated.  Objective:   Vitals:   02/24/18 1614  BP: 134/83  Pulse: 97  Weight: 222 lb 4.8 oz (100.8 kg)    Fetal Status: Fetal Heart Rate (bpm): 144   Movement: Present     General:  Alert, oriented and cooperative. Patient is in no acute distress.  Skin: Skin is warm and dry. No rash noted.   Cardiovascular: Normal heart rate noted  Respiratory: Normal respiratory effort, no problems with respiration noted  Abdomen: Soft, gravid, appropriate for gestational age.  Pain/Pressure: Absent     Pelvic: Cervical exam deferred        Extremities: Normal range of motion.  Edema: None  Mental Status: Normal mood and affect. Normal behavior. Normal judgment and thought content.   Assessment and Plan:  Pregnancy: G1P0 at [redacted]w[redacted]d  1. Supervision of high risk pregnancy, antepartum   2. Cervical shortening affecting pregnancy   Preterm labor symptoms and general obstetric precautions  including but not limited to vaginal bleeding, contractions, leaking of fluid and fetal movement were reviewed in detail with the patient. Please refer to After Visit Summary for other counseling recommendations.  Return in about 2 weeks (around 03/10/2018).  Future Appointments  Date Time Provider [place]  03/22/2018  3:30 PM [facility] [place] 1 [facility]    Emeterio Reeve, MD

## 2018-03-09 ENCOUNTER — Ambulatory Visit (INDEPENDENT_AMBULATORY_CARE_PROVIDER_SITE_OTHER): Payer: Medicaid Other | Admitting: Obstetrics and Gynecology

## 2018-03-09 VITALS — BP 124/77 | HR 93 | Wt 221.3 lb

## 2018-03-09 DIAGNOSIS — O23593 Infection of other part of genital tract in pregnancy, third trimester: Secondary | ICD-10-CM

## 2018-03-09 DIAGNOSIS — O0990 Supervision of high risk pregnancy, unspecified, unspecified trimester: Secondary | ICD-10-CM

## 2018-03-09 DIAGNOSIS — Z8614 Personal history of Methicillin resistant Staphylococcus aureus infection: Secondary | ICD-10-CM

## 2018-03-09 DIAGNOSIS — O0993 Supervision of high risk pregnancy, unspecified, third trimester: Secondary | ICD-10-CM

## 2018-03-09 DIAGNOSIS — A5901 Trichomonal vulvovaginitis: Secondary | ICD-10-CM

## 2018-03-09 DIAGNOSIS — O26879 Cervical shortening, unspecified trimester: Secondary | ICD-10-CM

## 2018-03-09 DIAGNOSIS — O23592 Infection of other part of genital tract in pregnancy, second trimester: Secondary | ICD-10-CM

## 2018-03-09 DIAGNOSIS — O26873 Cervical shortening, third trimester: Secondary | ICD-10-CM

## 2018-03-09 DIAGNOSIS — Z3A35 35 weeks gestation of pregnancy: Secondary | ICD-10-CM

## 2018-03-09 MED ORDER — MICONAZOLE NITRATE 2 % VA CREA: 1.0000 | TOPICAL_CREAM | Freq: Every day | VAGINAL | 2 refills | Status: DC

## 2018-03-09 NOTE — Progress Notes (Signed)
Prenatal Visit Note Date: 03/09/2018 Clinic: [facility]  Subjective:  Catherine Cannon is a 38 y.o. G1P0 at [redacted]w[redacted]d being seen today for ongoing prenatal care.  She is currently monitored for the following issues for this high-risk pregnancy and has Supervision of high risk pregnancy, antepartum; Cervical shortening affecting pregnancy; Alcohol use affecting pregnancy; Maternal age 68+, primigravida, antepartum, second trimester; Trichomonal vaginitis during pregnancy in second trimester; Fetal cardiac anomaly complicating pregnancy, antepartum, single gestation; Obesity; History of sepsis this pregnancy; Tobacco use; and Intramural uterine fibroid - Retroplacental  on their problem list.  Patient reports: see RN note. Tailbone pain.  Contractions: Irritability. Vag. Bleeding: None.  Movement: Present. Denies leaking of fluid.   The following portions of the patient's history were reviewed and updated as appropriate: allergies, current medications, past family history, past medical history, past social history, past surgical history and problem list. Problem list updated.  Objective:   Vitals:   03/09/18 1538  BP: 124/77  Pulse: 93  Weight: 221 lb 4.8 oz (100.4 kg)    Fetal Status: Fetal Heart Rate (bpm): 145 Fundal Height: 35 cm Movement: Present  Presentation: Vertex  General:  Alert, oriented and cooperative. Patient is in no acute distress.  Skin: Skin is warm and dry. No rash noted.   Cardiovascular: Normal heart rate noted  Respiratory: Normal respiratory effort, no problems with respiration noted  Abdomen: Soft, gravid, appropriate for gestational age. Pain/Pressure: Present     Pelvic:  Cervical exam deferred        Extremities: Normal range of motion.  Edema: None  Mental Status: Normal mood and affect. Normal behavior. Normal judgment and thought content.   Urinalysis:      Assessment and Plan:  Pregnancy: G1P0 at [redacted]w[redacted]d  1. Trichomonal vaginitis during  pregnancy in second trimester toc neg.   2. Supervision of high risk pregnancy, antepartum Routine care. ?nexplanon. gbs next visit. Recommend girdle for discomfort.   3. Cervical shortening affecting pregnancy Stopped vag progesterone last week. Recommend until 37wks  4. History of MRSA infection Nasal swab today - MRSA culture  Preterm labor symptoms and general obstetric precautions including but not limited to vaginal bleeding, contractions, leaking of fluid and fetal movement were reviewed in detail with the patient. Please refer to After Visit Summary for other counseling recommendations.  Return in about 1 week (around 03/16/2018) for hrob.   Aletha Halim, MD

## 2018-03-09 NOTE — Progress Notes (Signed)
Tailbone pain

## 2018-03-11 LAB — MRSA CULTURE: MRSA Screen: POSITIVE — AB

## 2018-03-14 DIAGNOSIS — Z8614 Personal history of Methicillin resistant Staphylococcus aureus infection: Secondary | ICD-10-CM | POA: Insufficient documentation

## 2018-03-14 MED ORDER — MUPIROCIN 2 % EX OINT: 1.0000 "application " | TOPICAL_OINTMENT | Freq: Two times a day (BID) | CUTANEOUS | 0 refills | Status: AC

## 2018-03-14 NOTE — Addendum Note (Signed)
Addended by: Aletha Halim on: 03/14/2018 09:38 AM   Modules accepted: Orders

## 2018-03-15 ENCOUNTER — Telehealth: Payer: Self-pay | Admitting: General Practice

## 2018-03-15 NOTE — Telephone Encounter (Signed)
-----   Message from Aletha Halim, MD sent at 03/14/2018  9:38 AM EST ----- Can you let her know that her swab is still + and abx to her nostrils were sent in? thanks

## 2018-03-15 NOTE — Telephone Encounter (Signed)
Called patient, no answer- unable to leave message as voicemail was not set up.

## 2018-03-16 ENCOUNTER — Ambulatory Visit (INDEPENDENT_AMBULATORY_CARE_PROVIDER_SITE_OTHER): Payer: Medicaid Other | Admitting: Obstetrics & Gynecology

## 2018-03-16 ENCOUNTER — Other Ambulatory Visit (HOSPITAL_COMMUNITY)
Admission: RE | Admit: 2018-03-16 | Discharge: 2018-03-16 | Disposition: A | Discharge status: Home / Self Care | Payer: Medicaid Other | Source: Ambulatory Visit | Attending: Obstetrics & Gynecology | Admitting: Obstetrics & Gynecology

## 2018-03-16 VITALS — BP 133/77 | HR 89 | Wt 227.8 lb

## 2018-03-16 DIAGNOSIS — O09213 Supervision of pregnancy with history of pre-term labor, third trimester: Secondary | ICD-10-CM

## 2018-03-16 DIAGNOSIS — E669 Obesity, unspecified: Secondary | ICD-10-CM

## 2018-03-16 DIAGNOSIS — O0990 Supervision of high risk pregnancy, unspecified, unspecified trimester: Secondary | ICD-10-CM | POA: Diagnosis not present

## 2018-03-16 DIAGNOSIS — O0993 Supervision of high risk pregnancy, unspecified, third trimester: Secondary | ICD-10-CM

## 2018-03-16 DIAGNOSIS — O09512 Supervision of elderly primigravida, second trimester: Secondary | ICD-10-CM

## 2018-03-16 DIAGNOSIS — O09513 Supervision of elderly primigravida, third trimester: Secondary | ICD-10-CM

## 2018-03-16 NOTE — Progress Notes (Signed)
   PRENATAL VISIT NOTE  Subjective:  Catherine Cannon is a 38 y.o. G1P0 at [redacted]w[redacted]d being seen today for ongoing prenatal care.  She is currently monitored for the following issues for this high-risk pregnancy and has Supervision of high risk pregnancy, antepartum; Cervical shortening affecting pregnancy; Alcohol use affecting pregnancy; Maternal age 44+, primigravida, antepartum, second trimester; Trichomonal vaginitis during pregnancy in second trimester; Fetal cardiac anomaly complicating pregnancy, antepartum, single gestation; Obesity; History of sepsis this pregnancy; Tobacco use; Intramural uterine fibroid - Retroplacental ; and History of MRSA infection on their problem list.  Patient reports no complaints.  Contractions: Not present. Vag. Bleeding: None.  Movement: Present. Denies leaking of fluid.   The following portions of the patient's history were reviewed and updated as appropriate: allergies, current medications, past family history, past medical history, past social history, past surgical history and problem list. Problem list updated.  Objective:   Vitals:   03/16/18 1608  BP: 133/77  Pulse: 89  Weight: 227 lb 12.8 oz (103.3 kg)    Fetal Status: Fetal Heart Rate (bpm): 133   Movement: Present     General:  Alert, oriented and cooperative. Patient is in no acute distress.  Skin: Skin is warm and dry. No rash noted.   Cardiovascular: Normal heart rate noted  Respiratory: Normal respiratory effort, no problems with respiration noted  Abdomen: Soft, gravid, appropriate for gestational age.  Pain/Pressure: Present     Pelvic: Cervical exam deferred        Extremities: Normal range of motion.  Edema: Trace  Mental Status: Normal mood and affect. Normal behavior. Normal judgment and thought content.   Assessment and Plan:  Pregnancy: G1P0 at [redacted]w[redacted]d  1. Supervision of high risk pregnancy, antepartum  - GC/Chlamydia probe amp ([facility])not at [facility] - Culture, beta strep  (group b only)  2. Class 1 obesity in adult, unspecified BMI, unspecified obesity type, unspecified whether serious comorbidity present   3. Maternal age 34+, primigravida, antepartum, second trimester   Preterm labor symptoms and general obstetric precautions including but not limited to vaginal bleeding, contractions, leaking of fluid and fetal movement were reviewed in detail with the patient. Please refer to After Visit Summary for other counseling recommendations.  Return in about 1 week (around 03/23/2018).  Future Appointments  Date Time Provider [place]  03/22/2018  3:30 PM [place] WH-MFCUS MFC-US  03/23/2018  3:55 PM Sloan Leiter, MD [facility]  03/30/2018  3:55 PM Emily Filbert, MD [facility]  04/06/2018  3:55 PM Emily Filbert, MD [place] [facility]  04/13/2018  3:55 PM Emily Filbert, MD WOC-WOCA WOC    Emily Filbert, MD

## 2018-03-16 NOTE — Telephone Encounter (Signed)
Called pt to inform her that her swab was still positive for MRSA and that an antibiotic for her to apply nasally was sent in to the [place] on [address].  Pt did not pick up and pt voicemail was not set up so unable to leave voicemail.  Letter sent.

## 2018-03-17 LAB — GC/CHLAMYDIA PROBE AMP (~~LOC~~) NOT AT ARMC
Chlamydia: NEGATIVE
Neisseria Gonorrhea: NEGATIVE

## 2018-03-20 LAB — CULTURE, BETA STREP (GROUP B ONLY): Strep Gp B Culture: NEGATIVE

## 2018-03-22 ENCOUNTER — Ambulatory Visit (HOSPITAL_COMMUNITY)
Admission: RE | Admit: 2018-03-22 | Discharge: 2018-03-22 | Disposition: A | Discharge status: Home / Self Care | Payer: Medicaid Other | Source: Ambulatory Visit | Attending: Obstetrics & Gynecology | Admitting: Obstetrics & Gynecology

## 2018-03-22 ENCOUNTER — Encounter (HOSPITAL_COMMUNITY): Payer: Self-pay

## 2018-03-22 DIAGNOSIS — O0990 Supervision of high risk pregnancy, unspecified, unspecified trimester: Secondary | ICD-10-CM | POA: Diagnosis present

## 2018-03-22 DIAGNOSIS — O09513 Supervision of elderly primigravida, third trimester: Secondary | ICD-10-CM | POA: Diagnosis not present

## 2018-03-22 DIAGNOSIS — O09519 Supervision of elderly primigravida, unspecified trimester: Secondary | ICD-10-CM

## 2018-03-22 DIAGNOSIS — O99213 Obesity complicating pregnancy, third trimester: Secondary | ICD-10-CM | POA: Diagnosis not present

## 2018-03-22 DIAGNOSIS — O99313 Alcohol use complicating pregnancy, third trimester: Secondary | ICD-10-CM | POA: Diagnosis not present

## 2018-03-22 DIAGNOSIS — O26879 Cervical shortening, unspecified trimester: Secondary | ICD-10-CM

## 2018-03-22 DIAGNOSIS — O99333 Smoking (tobacco) complicating pregnancy, third trimester: Secondary | ICD-10-CM | POA: Diagnosis not present

## 2018-03-22 DIAGNOSIS — Z3A37 37 weeks gestation of pregnancy: Secondary | ICD-10-CM

## 2018-03-23 ENCOUNTER — Encounter: Payer: Self-pay | Admitting: Obstetrics and Gynecology

## 2018-03-23 ENCOUNTER — Ambulatory Visit (INDEPENDENT_AMBULATORY_CARE_PROVIDER_SITE_OTHER): Payer: Medicaid Other | Admitting: Obstetrics and Gynecology

## 2018-03-23 VITALS — BP 125/72 | HR 83 | Wt 225.0 lb

## 2018-03-23 DIAGNOSIS — Z8619 Personal history of other infectious and parasitic diseases: Secondary | ICD-10-CM

## 2018-03-23 DIAGNOSIS — Z8614 Personal history of Methicillin resistant Staphylococcus aureus infection: Secondary | ICD-10-CM

## 2018-03-23 DIAGNOSIS — O0990 Supervision of high risk pregnancy, unspecified, unspecified trimester: Secondary | ICD-10-CM

## 2018-03-23 DIAGNOSIS — O26879 Cervical shortening, unspecified trimester: Secondary | ICD-10-CM

## 2018-03-23 DIAGNOSIS — O26873 Cervical shortening, third trimester: Secondary | ICD-10-CM

## 2018-03-23 DIAGNOSIS — O0993 Supervision of high risk pregnancy, unspecified, third trimester: Secondary | ICD-10-CM

## 2018-03-23 DIAGNOSIS — O35BXX0 Maternal care for other (suspected) fetal abnormality and damage, fetal cardiac anomalies, not applicable or unspecified: Secondary | ICD-10-CM

## 2018-03-23 DIAGNOSIS — O09513 Supervision of elderly primigravida, third trimester: Secondary | ICD-10-CM

## 2018-03-23 DIAGNOSIS — O09512 Supervision of elderly primigravida, second trimester: Secondary | ICD-10-CM

## 2018-03-23 DIAGNOSIS — Z720 Tobacco use: Secondary | ICD-10-CM

## 2018-03-23 DIAGNOSIS — E6609 Other obesity due to excess calories: Secondary | ICD-10-CM

## 2018-03-23 DIAGNOSIS — O358XX0 Maternal care for other (suspected) fetal abnormality and damage, not applicable or unspecified: Secondary | ICD-10-CM

## 2018-03-23 NOTE — Progress Notes (Signed)
   PRENATAL VISIT NOTE  Subjective:  Catherine Cannon is a 38 y.o. G1P0 at [redacted]w[redacted]d being seen today for ongoing prenatal care.  She is currently monitored for the following issues for this high-risk pregnancy and has Supervision of high risk pregnancy, antepartum; Cervical shortening affecting pregnancy; Alcohol use affecting pregnancy; Maternal age 28+, primigravida, antepartum, second trimester; Trichomonal vaginitis during pregnancy in second trimester; Fetal cardiac anomaly complicating pregnancy, antepartum, single gestation; Obesity; History of sepsis this pregnancy; Tobacco use; Intramural uterine fibroid - Retroplacental ; and History of MRSA infection on their problem list.  Patient reports vaginal pressure.  Contractions: Not present. Vag. Bleeding: None.  Movement: Present. Denies leaking of fluid.   The following portions of the patient's history were reviewed and updated as appropriate: allergies, current medications, past family history, past medical history, past social history, past surgical history and problem list. Problem list updated.  Objective:   Vitals:   03/23/18 1636  BP: 125/72  Pulse: 83  Weight: 225 lb (102.1 kg)    Fetal Status: Fetal Heart Rate (bpm): 130   Movement: Present     General:  Alert, oriented and cooperative. Patient is in no acute distress.  Skin: Skin is warm and dry. No rash noted.   Cardiovascular: Normal heart rate noted  Respiratory: Normal respiratory effort, no problems with respiration noted  Abdomen: Soft, gravid, appropriate for gestational age.  Pain/Pressure: Present     Pelvic: Cervical exam deferred        Extremities: Normal range of motion.  Edema: Trace  Mental Status: Normal mood and affect. Normal behavior. Normal judgment and thought content.   Assessment and Plan:  Pregnancy: G1P0 at [redacted]w[redacted]d  1. Maternal age 96+, primigravida, antepartum, second trimester  2. Supervision of high risk pregnancy, antepartum Would like to take  home placenta  3. Cervical shortening affecting pregnancy  4. History of sepsis this pregnancy S/p ARDS  5. Tobacco use 1/2 ppd  6. History of MRSA infection Needs swab  7. Class 1 obesity due to excess calories with serious comorbidity in adult, unspecified BMI  8. Fetal cardiac anomaly complicating pregnancy, antepartum, single gestation Fetal echo PP  Term labor symptoms and general obstetric precautions including but not limited to vaginal bleeding, contractions, leaking of fluid and fetal movement were reviewed in detail with the patient. Please refer to After Visit Summary for other counseling recommendations.  Return in about 1 week (around 03/30/2018) for OB visit (MD).  Future Appointments  Date Time Provider [place]  03/30/2018  3:55 PM Emily Filbert, MD [facility] [place]  04/06/2018  3:55 PM Emily Filbert, MD [facility] [place]  04/13/2018  3:55 PM Emily Filbert, MD [facility]    Sloan Leiter, MD

## 2018-03-30 ENCOUNTER — Ambulatory Visit (INDEPENDENT_AMBULATORY_CARE_PROVIDER_SITE_OTHER): Payer: Medicaid Other | Admitting: Obstetrics & Gynecology

## 2018-03-30 VITALS — BP 121/78 | HR 86 | Wt 222.1 lb

## 2018-03-30 DIAGNOSIS — O09523 Supervision of elderly multigravida, third trimester: Secondary | ICD-10-CM

## 2018-03-30 DIAGNOSIS — O0993 Supervision of high risk pregnancy, unspecified, third trimester: Secondary | ICD-10-CM

## 2018-03-30 DIAGNOSIS — E669 Obesity, unspecified: Secondary | ICD-10-CM

## 2018-03-30 DIAGNOSIS — O0990 Supervision of high risk pregnancy, unspecified, unspecified trimester: Secondary | ICD-10-CM

## 2018-03-30 NOTE — Progress Notes (Signed)
   PRENATAL VISIT NOTE  Subjective:  Catherine Cannon is a 38 y.o. G1P0 at [redacted]w[redacted]d being seen today for ongoing prenatal care.  She is currently monitored for the following issues for this low-risk pregnancy and has Supervision of high risk pregnancy, antepartum; Cervical shortening affecting pregnancy; Alcohol use affecting pregnancy; Maternal age 52+, primigravida, antepartum, second trimester; Trichomonal vaginitis during pregnancy in second trimester; Fetal cardiac anomaly complicating pregnancy, antepartum, single gestation; Obesity; History of sepsis this pregnancy; Tobacco use; Intramural uterine fibroid - Retroplacental ; and History of MRSA infection on their problem list.  Patient reports no complaints.  Contractions: Not present. Vag. Bleeding: None.  Movement: Present. Denies leaking of fluid.   The following portions of the patient's history were reviewed and updated as appropriate: allergies, current medications, past family history, past medical history, past social history, past surgical history and problem list. Problem list updated.  Objective:   Vitals:   03/30/18 1554  BP: 121/78  Pulse: 86  Weight: 222 lb 1.6 oz (100.7 kg)    Fetal Status:     Movement: Present     General:  Alert, oriented and cooperative. Patient is in no acute distress.  Skin: Skin is warm and dry. No rash noted.   Cardiovascular: Normal heart rate noted  Respiratory: Normal respiratory effort, no problems with respiration noted  Abdomen: Soft, gravid, appropriate for gestational age.  Pain/Pressure: Present     Pelvic: Cervical exam performed        Extremities: Normal range of motion.  Edema: Trace  Mental Status: Normal mood and affect. Normal behavior. Normal judgment and thought content.   Assessment and Plan:  Pregnancy: G1P0 at [redacted]w[redacted]d  1. Supervision of high risk pregnancy, antepartum   2. Multigravida of advanced maternal age in third trimester   3. Class 1 obesity in adult,  unspecified BMI, unspecified obesity type, unspecified whether serious comorbidity present   Term labor symptoms and general obstetric precautions including but not limited to vaginal bleeding, contractions, leaking of fluid and fetal movement were reviewed in detail with the patient. Please refer to After Visit Summary for other counseling recommendations.  Return in about 1 week (around 04/06/2018).  Future Appointments  Date Time Provider [place]  04/06/2018  3:55 PM Emily Filbert, MD [facility]  04/13/2018  3:55 PM Emily Filbert, MD [facility]    Emily Filbert, MD

## 2018-04-06 ENCOUNTER — Ambulatory Visit (INDEPENDENT_AMBULATORY_CARE_PROVIDER_SITE_OTHER): Payer: Medicaid Other | Admitting: Obstetrics & Gynecology

## 2018-04-06 VITALS — BP 137/81 | HR 87 | Wt 225.6 lb

## 2018-04-06 DIAGNOSIS — O09523 Supervision of elderly multigravida, third trimester: Secondary | ICD-10-CM

## 2018-04-06 DIAGNOSIS — O0990 Supervision of high risk pregnancy, unspecified, unspecified trimester: Secondary | ICD-10-CM

## 2018-04-06 DIAGNOSIS — O0993 Supervision of high risk pregnancy, unspecified, third trimester: Secondary | ICD-10-CM

## 2018-04-06 DIAGNOSIS — E669 Obesity, unspecified: Secondary | ICD-10-CM

## 2018-04-06 DIAGNOSIS — Z3A39 39 weeks gestation of pregnancy: Secondary | ICD-10-CM

## 2018-04-06 DIAGNOSIS — E66811 Obesity, class 1: Secondary | ICD-10-CM

## 2018-04-06 NOTE — Progress Notes (Signed)
   PRENATAL VISIT NOTE  Subjective:  Catherine Cannon is a 38 y.o. G1P0 at [redacted]w[redacted]d being seen today for ongoing prenatal care.  She is currently monitored for the following issues for this high-risk pregnancy and has Supervision of high risk pregnancy, antepartum; Cervical shortening affecting pregnancy; Alcohol use affecting pregnancy; Maternal age 64+, primigravida, antepartum, second trimester; Trichomonal vaginitis during pregnancy in second trimester; Fetal cardiac anomaly complicating pregnancy, antepartum, single gestation; Obesity; History of sepsis this pregnancy; Tobacco use; Intramural uterine fibroid - Retroplacental ; and History of MRSA infection on their problem list.  Patient reports no complaints.  Contractions: Not present. Vag. Bleeding: None.  Movement: Present. Denies leaking of fluid.   The following portions of the patient's history were reviewed and updated as appropriate: allergies, current medications, past family history, past medical history, past social history, past surgical history and problem list. Problem list updated.  Objective:   Vitals:   04/06/18 1621  BP: 137/81  Pulse: 87  Weight: 225 lb 9.6 oz (102.3 kg)    Fetal Status: Fetal Heart Rate (bpm): 130   Movement: Present     General:  Alert, oriented and cooperative. Patient is in no acute distress.  Skin: Skin is warm and dry. No rash noted.   Cardiovascular: Normal heart rate noted  Respiratory: Normal respiratory effort, no problems with respiration noted  Abdomen: Soft, gravid, appropriate for gestational age.  Pain/Pressure: Present     Pelvic: Cervical exam performed        Extremities: Normal range of motion.  Edema: Trace  Mental Status: Normal mood and affect. Normal behavior. Normal judgment and thought content.   Assessment and Plan:  Pregnancy: G1P0 at [redacted]w[redacted]d    2. Multigravida of advanced maternal age in third trimester   3. Class 1 obesity in adult, unspecified BMI, unspecified  obesity type, unspecified whether serious comorbidity present   Term labor symptoms and general obstetric precautions including but not limited to vaginal bleeding, contractions, leaking of fluid and fetal movement were reviewed in detail with the patient. Please refer to After Visit Summary for other counseling recommendations.  No follow-ups on file.  Future Appointments  Date Time Provider [place]  04/13/2018  3:55 PM Emily Filbert, MD [facility]    Emily Filbert, MD

## 2018-04-06 NOTE — Progress Notes (Signed)
   PRENATAL VISIT NOTE  Subjective:  Catherine Cannon is a 38 y.o. G1P0 at [redacted]w[redacted]d being seen today for ongoing prenatal care.  She is currently monitored for the following issues for this low-risk pregnancy and has Supervision of high risk pregnancy, antepartum; Cervical shortening affecting pregnancy; Alcohol use affecting pregnancy; Maternal age 2+, primigravida, antepartum, second trimester; Trichomonal vaginitis during pregnancy in second trimester; Fetal cardiac anomaly complicating pregnancy, antepartum, single gestation; Obesity; History of sepsis this pregnancy; Tobacco use; Intramural uterine fibroid - Retroplacental ; and History of MRSA infection on their problem list.  Patient reports no complaints.  Contractions: Not present. Vag. Bleeding: None.  Movement: Present. Denies leaking of fluid.   The following portions of the patient's history were reviewed and updated as appropriate: allergies, current medications, past family history, past medical history, past social history, past surgical history and problem list. Problem list updated.  Objective:   Vitals:   04/06/18 1621  BP: 137/81  Pulse: 87  Weight: 225 lb 9.6 oz (102.3 kg)    Fetal Status: Fetal Heart Rate (bpm): 130   Movement: Present     General:  Alert, oriented and cooperative. Patient is in no acute distress.  Skin: Skin is warm and dry. No rash noted.   Cardiovascular: Normal heart rate noted  Respiratory: Normal respiratory effort, no problems with respiration noted  Abdomen: Soft, gravid, appropriate for gestational age.  Pain/Pressure: Present     Pelvic: Cervical exam performed        Extremities: Normal range of motion.  Edema: Trace  Mental Status: Normal mood and affect. Normal behavior. Normal judgment and thought content.   Assessment and Plan:  Pregnancy: G1P0 at [redacted]w[redacted]d  1. Supervision of high risk pregnancy, antepartum   2. Multigravida of advanced maternal age in third trimester   3. Class 1  obesity in adult, unspecified BMI, unspecified obesity type, unspecified whether serious comorbidity present   Term labor symptoms and general obstetric precautions including but not limited to vaginal bleeding, contractions, leaking of fluid and fetal movement were reviewed in detail with the patient. Please refer to After Visit Summary for other counseling recommendations.  No follow-ups on file.  Future Appointments  Date Time Provider [place]  04/13/2018  3:55 PM Emily Filbert, MD [facility]    Emily Filbert, MD

## 2018-04-13 ENCOUNTER — Ambulatory Visit: Payer: Self-pay

## 2018-04-13 ENCOUNTER — Ambulatory Visit (INDEPENDENT_AMBULATORY_CARE_PROVIDER_SITE_OTHER): Payer: Medicaid Other | Admitting: Obstetrics & Gynecology

## 2018-04-13 ENCOUNTER — Encounter: Payer: Self-pay | Admitting: Obstetrics & Gynecology

## 2018-04-13 VITALS — BP 134/95 | HR 94 | Wt 225.2 lb

## 2018-04-13 DIAGNOSIS — E6609 Other obesity due to excess calories: Secondary | ICD-10-CM

## 2018-04-13 DIAGNOSIS — O09523 Supervision of elderly multigravida, third trimester: Secondary | ICD-10-CM

## 2018-04-13 DIAGNOSIS — O480 Post-term pregnancy: Secondary | ICD-10-CM | POA: Diagnosis not present

## 2018-04-13 DIAGNOSIS — E66811 Obesity, class 1: Secondary | ICD-10-CM

## 2018-04-13 DIAGNOSIS — O0993 Supervision of high risk pregnancy, unspecified, third trimester: Secondary | ICD-10-CM

## 2018-04-13 DIAGNOSIS — O0990 Supervision of high risk pregnancy, unspecified, unspecified trimester: Secondary | ICD-10-CM

## 2018-04-13 NOTE — Addendum Note (Signed)
Addended by: Langston Reusing on: 04/13/2018 05:24 PM   Modules accepted: Orders

## 2018-04-13 NOTE — Progress Notes (Signed)
   PRENATAL VISIT NOTE  Subjective:  Catherine Cannon is a 38 y.o. G1P0 at [redacted]w[redacted]d being seen today for ongoing prenatal care.  She is currently monitored for the following issues for this high-risk pregnancy and has Supervision of high risk pregnancy, antepartum; Alcohol use affecting pregnancy; Maternal age 22+, primigravida, antepartum, second trimester; Trichomonal vaginitis during pregnancy in second trimester; Fetal cardiac anomaly complicating pregnancy, antepartum, single gestation; Obesity; History of sepsis this pregnancy; Tobacco use; Intramural uterine fibroid - Retroplacental ; and History of MRSA infection on their problem list.  Patient reports no complaints.  Contractions: Irritability. Vag. Bleeding: None.  Movement: Present. Denies leaking of fluid.   The following portions of the patient's history were reviewed and updated as appropriate: allergies, current medications, past family history, past medical history, past social history, past surgical history and problem list. Problem list updated.  Objective:   Vitals:   04/13/18 1601  BP: (!) 134/95  Pulse: 94  Weight: 225 lb 3.2 oz (102.2 kg)    Fetal Status: Fetal Heart Rate (bpm): 150   Movement: Present     General:  Alert, oriented and cooperative. Patient is in no acute distress.  Skin: Skin is warm and dry. No rash noted.   Cardiovascular: Normal heart rate noted  Respiratory: Normal respiratory effort, no problems with respiration noted  Abdomen: Soft, gravid, appropriate for gestational age.  Pain/Pressure: Present     Pelvic: Cervical exam performed        Extremities: Normal range of motion.  Edema: Trace  Mental Status: Normal mood and affect. Normal behavior. Normal judgment and thought content.   Assessment and Plan:  Pregnancy: G1P0 at [redacted]w[redacted]d  1. Class 1 obesity due to excess calories with serious comorbidity in adult, unspecified BMI   2. Supervision of high risk pregnancy, antepartum - plan for IOL at  41 weeks - twice weekly NST until IOL  3. Multigravida of advanced maternal age in third trimester   Preterm labor symptoms and general obstetric precautions including but not limited to vaginal bleeding, contractions, leaking of fluid and fetal movement were reviewed in detail with the patient. Please refer to After Visit Summary for other counseling recommendations.  Return in about 4 days (around 04/17/2018) for 4 days for NST and 5 weeks for PP visit.  Future Appointments  Date Time Provider [place]  04/18/2018  7:30 AM WH-BSSCHED ROOM WH-BSSCHED None    Emily Filbert, MD

## 2018-04-14 ENCOUNTER — Encounter (HOSPITAL_COMMUNITY): Payer: Self-pay | Admitting: *Deleted

## 2018-04-14 ENCOUNTER — Telehealth (HOSPITAL_COMMUNITY): Payer: Self-pay | Admitting: *Deleted

## 2018-04-14 NOTE — Telephone Encounter (Signed)
Preadmission screen  

## 2018-04-18 ENCOUNTER — Encounter (HOSPITAL_COMMUNITY): Payer: Self-pay | Admitting: *Deleted

## 2018-04-18 ENCOUNTER — Inpatient Hospital Stay (HOSPITAL_COMMUNITY): Payer: Medicaid Other | Admitting: Anesthesiology

## 2018-04-18 ENCOUNTER — Inpatient Hospital Stay (HOSPITAL_COMMUNITY)
Admission: RE | Admit: 2018-04-18 | Discharge: 2018-04-21 | DRG: 768 | Disposition: A | Discharge status: Home / Self Care | Payer: Medicaid Other | Attending: Obstetrics and Gynecology | Admitting: Obstetrics and Gynecology

## 2018-04-18 ENCOUNTER — Inpatient Hospital Stay (HOSPITAL_COMMUNITY)
Admission: RE | Admit: 2018-04-18 | Discharge: 2018-04-18 | Disposition: A | Discharge status: Home / Self Care | Payer: Medicaid Other | Source: Ambulatory Visit | Attending: Family Medicine | Admitting: Family Medicine

## 2018-04-18 DIAGNOSIS — Z3A41 41 weeks gestation of pregnancy: Secondary | ICD-10-CM | POA: Diagnosis not present

## 2018-04-18 DIAGNOSIS — O99214 Obesity complicating childbirth: Secondary | ICD-10-CM | POA: Diagnosis present

## 2018-04-18 DIAGNOSIS — Z8619 Personal history of other infectious and parasitic diseases: Secondary | ICD-10-CM | POA: Diagnosis present

## 2018-04-18 DIAGNOSIS — O134 Gestational [pregnancy-induced] hypertension without significant proteinuria, complicating childbirth: Secondary | ICD-10-CM | POA: Diagnosis present

## 2018-04-18 DIAGNOSIS — Z8614 Personal history of Methicillin resistant Staphylococcus aureus infection: Secondary | ICD-10-CM | POA: Diagnosis not present

## 2018-04-18 DIAGNOSIS — E669 Obesity, unspecified: Secondary | ICD-10-CM | POA: Diagnosis present

## 2018-04-18 DIAGNOSIS — D251 Intramural leiomyoma of uterus: Secondary | ICD-10-CM | POA: Diagnosis present

## 2018-04-18 DIAGNOSIS — F17210 Nicotine dependence, cigarettes, uncomplicated: Secondary | ICD-10-CM | POA: Diagnosis present

## 2018-04-18 DIAGNOSIS — O3413 Maternal care for benign tumor of corpus uteri, third trimester: Secondary | ICD-10-CM | POA: Diagnosis present

## 2018-04-18 DIAGNOSIS — O99334 Smoking (tobacco) complicating childbirth: Secondary | ICD-10-CM | POA: Diagnosis present

## 2018-04-18 DIAGNOSIS — O480 Post-term pregnancy: Secondary | ICD-10-CM | POA: Diagnosis present

## 2018-04-18 DIAGNOSIS — Z3A40 40 weeks gestation of pregnancy: Secondary | ICD-10-CM | POA: Diagnosis not present

## 2018-04-18 DIAGNOSIS — O99310 Alcohol use complicating pregnancy, unspecified trimester: Secondary | ICD-10-CM

## 2018-04-18 DIAGNOSIS — O23592 Infection of other part of genital tract in pregnancy, second trimester: Secondary | ICD-10-CM

## 2018-04-18 DIAGNOSIS — A5901 Trichomonal vulvovaginitis: Secondary | ICD-10-CM | POA: Diagnosis present

## 2018-04-18 DIAGNOSIS — O35BXX0 Maternal care for other (suspected) fetal abnormality and damage, fetal cardiac anomalies, not applicable or unspecified: Secondary | ICD-10-CM | POA: Diagnosis present

## 2018-04-18 DIAGNOSIS — O358XX0 Maternal care for other (suspected) fetal abnormality and damage, not applicable or unspecified: Secondary | ICD-10-CM | POA: Diagnosis present

## 2018-04-18 DIAGNOSIS — Z720 Tobacco use: Secondary | ICD-10-CM | POA: Diagnosis present

## 2018-04-18 DIAGNOSIS — O09512 Supervision of elderly primigravida, second trimester: Secondary | ICD-10-CM | POA: Diagnosis present

## 2018-04-18 DIAGNOSIS — Z8759 Personal history of other complications of pregnancy, childbirth and the puerperium: Secondary | ICD-10-CM

## 2018-04-18 LAB — COMPREHENSIVE METABOLIC PANEL
ALT: 20 U/L (ref 0–44)
AST: 16 U/L (ref 15–41)
Albumin: 2.8 g/dL — ABNORMAL LOW (ref 3.5–5.0)
Alkaline Phosphatase: 161 U/L — ABNORMAL HIGH (ref 38–126)
Anion gap: 9 (ref 5–15)
BUN: 5 mg/dL — ABNORMAL LOW (ref 6–20)
CO2: 21 mmol/L — AB (ref 22–32)
Calcium: 9.3 mg/dL (ref 8.9–10.3)
Chloride: 104 mmol/L (ref 98–111)
Creatinine, Ser: 0.54 mg/dL (ref 0.44–1.00)
GFR calc Af Amer: >60 mL/min (ref >60)
GFR calc non Af Amer: >60 mL/min (ref >60)
Glucose, Bld: 83 mg/dL (ref 70–99)
Potassium: 4 mmol/L (ref 3.5–5.1)
Sodium: 134 mmol/L — ABNORMAL LOW (ref 135–145)
Total Bilirubin: 0.1 mg/dL — ABNORMAL LOW (ref 0.3–1.2)
Total Protein: 6 g/dL — ABNORMAL LOW (ref 6.5–8.1)

## 2018-04-18 LAB — CBC
HCT: 42.3 % (ref 36.0–46.0)
Hemoglobin: 14 g/dL (ref 12.0–15.0)
MCH: 29.7 pg (ref 26.0–34.0)
MCHC: 33.1 g/dL (ref 30.0–36.0)
MCV: 89.8 fL (ref 80.0–100.0)
Platelets: 258 10*3/uL (ref 150–400)
RBC: 4.71 MIL/uL (ref 3.87–5.11)
RDW: 14.3 % (ref 11.5–15.5)
WBC: 11.1 10*3/uL — AB (ref 4.0–10.5)
nRBC: 0 % (ref 0.0–0.2)

## 2018-04-18 LAB — MRSA PCR SCREENING: MRSA by PCR: POSITIVE — AB

## 2018-04-18 LAB — WET PREP, GENITAL
CLUE CELLS WET PREP: NONE SEEN
Sperm: NONE SEEN
Yeast Wet Prep HPF POC: NONE SEEN

## 2018-04-18 MED ORDER — PHENYLEPHRINE 40 MCG/ML (10ML) SYRINGE FOR IV PUSH (FOR BLOOD PRESSURE SUPPORT): 80.0000 ug | PREFILLED_SYRINGE | INTRAVENOUS | Status: DC | PRN

## 2018-04-18 MED ORDER — METRONIDAZOLE 500 MG PO TABS
2000.0000 mg | ORAL_TABLET | Freq: Once | ORAL | Status: AC
  Administered 2018-04-18: 2000 mg via ORAL
  Filled 2018-04-18 (×2): qty 4

## 2018-04-18 MED ORDER — TERBUTALINE SULFATE 1 MG/ML IJ SOLN: 0.2500 mg | Freq: Once | INTRAMUSCULAR | Status: DC | PRN

## 2018-04-18 MED ORDER — LIDOCAINE HCL (PF) 1 % IJ SOLN
INTRAMUSCULAR | Status: DC | PRN
  Administered 2018-04-18: 11 mL via EPIDURAL

## 2018-04-18 MED ORDER — LACTATED RINGERS AMNIOINFUSION: INTRAVENOUS | Status: DC

## 2018-04-18 MED ORDER — OXYCODONE-ACETAMINOPHEN 5-325 MG PO TABS: 2.0000 | ORAL_TABLET | ORAL | Status: DC | PRN

## 2018-04-18 MED ORDER — FENTANYL-BUPIVACAINE-NACL 0.5-0.125-0.9 MG/250ML-% EP SOLN
12.0000 mL/h | EPIDURAL | Status: DC | PRN
  Administered 2018-04-19: 12 mL/h via EPIDURAL
  Filled 2018-04-18 (×2): qty 250

## 2018-04-18 MED ORDER — PHENYLEPHRINE 40 MCG/ML (10ML) SYRINGE FOR IV PUSH (FOR BLOOD PRESSURE SUPPORT)
80.0000 ug | PREFILLED_SYRINGE | INTRAVENOUS | Status: DC | PRN
  Filled 2018-04-18: qty 10

## 2018-04-18 MED ORDER — LACTATED RINGERS IV SOLN: 500.0000 mL | Freq: Once | INTRAVENOUS | Status: DC

## 2018-04-18 MED ORDER — ACETAMINOPHEN 325 MG PO TABS: 650.0000 mg | ORAL_TABLET | ORAL | Status: DC | PRN

## 2018-04-18 MED ORDER — MISOPROSTOL 50MCG HALF TABLET
50.0000 ug | ORAL_TABLET | ORAL | Status: DC | PRN
  Administered 2018-04-18: 50 ug via BUCCAL
  Filled 2018-04-18: qty 1

## 2018-04-18 MED ORDER — NICOTINE 14 MG/24HR TD PT24
14.0000 mg | MEDICATED_PATCH | Freq: Every day | TRANSDERMAL | Status: DC
  Administered 2018-04-18: 14 mg via TRANSDERMAL
  Filled 2018-04-18 (×3): qty 1

## 2018-04-18 MED ORDER — OXYTOCIN 40 UNITS IN NORMAL SALINE INFUSION - SIMPLE MED
1.0000 m[IU]/min | INTRAVENOUS | Status: DC
  Administered 2018-04-18: 2 m[IU]/min via INTRAVENOUS
  Filled 2018-04-18: qty 1000

## 2018-04-18 MED ORDER — ONDANSETRON HCL 4 MG/2ML IJ SOLN
4.0000 mg | Freq: Four times a day (QID) | INTRAMUSCULAR | Status: DC | PRN
  Administered 2018-04-19: 4 mg via INTRAVENOUS
  Filled 2018-04-18: qty 2

## 2018-04-18 MED ORDER — EPHEDRINE 5 MG/ML INJ: 10.0000 mg | INTRAVENOUS | Status: DC | PRN

## 2018-04-18 MED ORDER — OXYTOCIN 40 UNITS IN NORMAL SALINE INFUSION - SIMPLE MED
2.5000 [IU]/h | INTRAVENOUS | Status: DC
  Administered 2018-04-19: 2.5 [IU]/h via INTRAVENOUS

## 2018-04-18 MED ORDER — OXYCODONE-ACETAMINOPHEN 5-325 MG PO TABS: 1.0000 | ORAL_TABLET | ORAL | Status: DC | PRN

## 2018-04-18 MED ORDER — MISOPROSTOL 25 MCG QUARTER TABLET
25.0000 ug | ORAL_TABLET | ORAL | Status: DC | PRN
  Administered 2018-04-18: 25 ug via VAGINAL
  Filled 2018-04-18 (×3): qty 1

## 2018-04-18 MED ORDER — OXYTOCIN BOLUS FROM INFUSION
500.0000 mL | Freq: Once | INTRAVENOUS | Status: AC
  Administered 2018-04-19: 500 mL via INTRAVENOUS

## 2018-04-18 MED ORDER — LACTATED RINGERS IV SOLN
INTRAVENOUS | Status: DC
  Administered 2018-04-18 – 2018-04-19 (×5): via INTRAVENOUS

## 2018-04-18 MED ORDER — SODIUM CHLORIDE (PF) 0.9 % IJ SOLN
INTRAMUSCULAR | Status: DC | PRN
  Administered 2018-04-18: 12 mL/h via EPIDURAL

## 2018-04-18 MED ORDER — NICOTINE 7 MG/24HR TD PT24
7.0000 mg | MEDICATED_PATCH | Freq: Every day | TRANSDERMAL | Status: DC
  Filled 2018-04-18: qty 1

## 2018-04-18 MED ORDER — LACTATED RINGERS IV SOLN
500.0000 mL | INTRAVENOUS | Status: DC | PRN
  Administered 2018-04-18 (×2): 500 mL via INTRAVENOUS

## 2018-04-18 MED ORDER — FENTANYL CITRATE (PF) 100 MCG/2ML IJ SOLN
100.0000 ug | INTRAMUSCULAR | Status: DC | PRN
  Administered 2018-04-18 – 2018-04-19 (×3): 100 ug via INTRAVENOUS
  Filled 2018-04-18 (×3): qty 2

## 2018-04-18 MED ORDER — DIPHENHYDRAMINE HCL 50 MG/ML IJ SOLN: 12.5000 mg | INTRAMUSCULAR | Status: DC | PRN

## 2018-04-18 MED ORDER — LIDOCAINE HCL (PF) 1 % IJ SOLN
30.0000 mL | INTRAMUSCULAR | Status: AC | PRN
  Administered 2018-04-19: 30 mL via SUBCUTANEOUS
  Filled 2018-04-18: qty 30

## 2018-04-18 MED ORDER — SOD CITRATE-CITRIC ACID 500-334 MG/5ML PO SOLN: 30.0000 mL | ORAL | Status: DC | PRN

## 2018-04-18 NOTE — Progress Notes (Signed)
LABOR PROGRESS NOTE  Catherine Cannon is a 38 y.o. G1P0 at [redacted]w[redacted]d  admitted for post-date IOL.  Subjective: Strip note. Discussed plan of care with RN.   Objective: BP (!) 143/95   Pulse 100   Temp 98.2 F (36.8 C) (Oral)   Ht 5\' 9"  (1.753 m)   Wt 102.1 kg   LMP 07/14/2017 (Exact Date) Comment: irregular  SpO2 99%   BMI 33.23 kg/m  or  Vitals:   04/18/18 1656 04/18/18 1701 04/18/18 1706 04/18/18 1707  BP: (!) 148/86 (!) 151/90  (!) 143/95  Pulse: 96 90  100  Temp:      TempSrc:      SpO2: 98% 97% 99%   Weight:      Height:       Dilation: 4 Effacement (%): 80 Station: -3 Presentation: Vertex Exam by:: J.Cox, RN FHT: baseline rate 130, moderate varibility, + acel, no decel Toco: q2-3 min  Labs: Lab Results  Component Value Date   WBC 11.1 (H) 04/18/2018   HGB 14.0 04/18/2018   HCT 42.3 04/18/2018   MCV 89.8 04/18/2018   PLT 258 04/18/2018    Patient Active Problem List   Diagnosis Date Noted  . Personal history of previous postdates pregnancy 04/18/2018  . History of MRSA infection 03/14/2018  . Obesity 12/22/2017  . History of sepsis this pregnancy 12/22/2017  . Tobacco use 12/22/2017  . 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 Supervision of high risk pregnancy, antepartum 12/13/2017  . 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 Trichomonal vaginitis during pregnancy in second trimester 11/15/2017  . Maternal age 12+, primigravida, antepartum, second trimester 11/11/2017  . Fetal cardiac anomaly complicating pregnancy, antepartum, single gestation 11/11/2017    Assessment / Plan: 38 y.o. G1P0 at [redacted]w[redacted]d here for post-date IOL  GHTN: No severe range pressures. Continue to monitor.   Labor:  SROM @ 1400 (clear fluid). FB now out. Received 2nd cytotec at 1430. Will plan to start Pit 2x2 at 1830.  Fetal Wellbeing:  CAT I Pain Control:  Epidural in place  Anticipated MOD:  SVD  Phill Myron, D.O. [facility] Family Medicine Fellow,  [facility] for [facility] [place] [facility] 04/18/2018, 5:17 PM

## 2018-04-18 NOTE — Progress Notes (Signed)
LABOR PROGRESS NOTE  Geanie Pacifico is a 38 y.o. G1P0 at [redacted]w[redacted]d  admitted for post-date IOL.  Subjective: Patient doing well. Feeling contractions intermittently. Feels fetal movements. No concerns or complaints at this time. Support system at bedside.  Objective: BP (!) 145/87   Pulse 91   Temp 98.2 F (36.8 C) (Oral)   Ht 5\' 9"  (1.753 m)   Wt 102.1 kg   LMP 07/14/2017 (Exact Date) Comment: irregular  BMI 33.23 kg/m  or  Vitals:   04/18/18 0754 04/18/18 1044 04/18/18 1145 04/18/18 1339  BP:  (!) 156/77 (!) 145/92 (!) 145/87  Pulse:  90 86 91  Temp:      TempSrc:      Weight: 102.1 kg     Height: 5\' 9"  (1.753 m)      Dilation: 1 Effacement (%): 50 Station: -3 Presentation: Vertex Exam by:: Romelle Reiley FHT: baseline rate 135, moderate varibility, + acel, no decel Toco: variable, CTX q2-6 mins  Labs: Lab Results  Component Value Date   WBC 11.1 (H) 04/18/2018   HGB 14.0 04/18/2018   HCT 42.3 04/18/2018   MCV 89.8 04/18/2018   PLT 258 04/18/2018    Patient Active Problem List   Diagnosis Date Noted  . Personal history of previous postdates pregnancy 04/18/2018  . History of MRSA infection 03/14/2018  . Obesity 12/22/2017  . History of sepsis this pregnancy 12/22/2017  . Tobacco use 12/22/2017  . 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 Supervision of high risk pregnancy, antepartum 12/13/2017  . 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 Trichomonal vaginitis during pregnancy in second trimester 11/15/2017  . Maternal age 80+, primigravida, antepartum, second trimester 11/11/2017  . Fetal cardiac anomaly complicating pregnancy, antepartum, single gestation 11/11/2017    Assessment / Plan: 38 y.o. G1P0 at [redacted]w[redacted]d here for post-date IOL  Labor: FB in place. Minimal cervical change. SROM @ 1400 during cervical exam with clear fluid. Will give oral Cytotec.  Fetal Wellbeing:  CAT I Pain Control:  IV pain meds/Epidural upon request Anticipated  MOD:  SVD  Mina Marble, D.O. [place], PGY1 04/18/2018, 2:23 PM

## 2018-04-18 NOTE — Anesthesia Procedure Notes (Signed)
Epidural Patient location during procedure: OB Start time: 04/18/2018 4:20 PM End time: 04/18/2018 4:34 PM  Staffing Anesthesiologist: Lynda Rainwater, MD Performed: anesthesiologist   Preanesthetic Checklist Completed: patient identified, site marked, surgical consent, pre-op evaluation, timeout performed, IV checked, risks and benefits discussed and monitors and equipment checked  Epidural Patient position: sitting Prep: ChloraPrep Patient monitoring: heart rate, cardiac monitor, continuous pulse ox and blood pressure Approach: midline Location: L2-L3 Injection technique: LOR saline  Needle:  Needle type: Tuohy  Needle gauge: 17 G Needle length: 9 cm Needle insertion depth: 8 cm Catheter type: closed end flexible Catheter size: 20 Guage Catheter at skin depth: 12 cm Test dose: negative  Assessment Events: blood not aspirated, injection not painful, no injection resistance, negative IV test and no paresthesia  Additional Notes Reason for block:procedure for pain

## 2018-04-18 NOTE — H&P (Signed)
OBSTETRIC ADMISSION HISTORY AND PHYSICAL  Elvena Oyer is a 38 y.o. female G1P0 with IUP at [redacted]w[redacted]d by --/16 presenting for PDIOL.   Reports fetal movement. Denies vaginal bleeding, leakage of fluids.   She received her prenatal care at [facility].  Support person in labor: [employer] developer . 16w1: viability, detailed U/S, initial anatomy U/S . 18w2: repeat anatomy U/S, cardiac views with vascular ring suspicious for aberrant subclavian vessel, anterior fundal placenta, shorter cervix measuring 2.3 to 2.7cm  . 19w1: fetal ECHO - no vascular ring identified but report states unable to rule out  recommendation for transthoracic ECHO after birth to determine arch branching  . 20w1: cervical length 2.7 to 3.1 without funneling and no change with external abdominal pressure . 30w0: EFW 1500g, 54% . 33w0: EFW 2162g, 64% . 37w0: EFW 2828g, 46%  Prenatal History/Complications: . 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 AMA . EtOH Use in Pregnancy - up until 14 weeks when discovered pregnancy   Past Medical History: Past Medical History:  Diagnosis Date  . Acute respiratory failure with hypoxia ([place]) 10/16/2017  . ARDS (adult respiratory distress syndrome) ([place]) 10/19/2017  . Multifocal pneumonia 10/16/2017  . Sepsis ([place]) 10/16/2017    Past Surgical History: Past Surgical History:  Procedure Laterality Date  . NO PAST SURGERIES      Obstetrical History: OB History    Gravida  1   Para      Term      Preterm      AB      Living  0     SAB      TAB      Ectopic      Multiple      Live Births              Social History: Social History   Socioeconomic History  . Marital status: Single    Spouse name: Not on file  . Number of children: Not on file  . Years of education: Not on file  .  Highest education level: Not on file  Occupational History  . Not on file  Social Needs  . Financial resource strain: Not hard at all  . Food insecurity:    Worry: Never true    Inability: Never true  . Transportation needs:    Medical: No    Non-medical: Not on file  Tobacco Use  . Smoking status: Current Every Day Smoker    Packs/day: 0.50    Types: Cigarettes  . Smokeless tobacco: Never Used  Substance and Sexual Activity  . Alcohol use: Not Currently    Comment: occ   . Drug use: Not Currently    Types: Cocaine    Comment: years ago  . Sexual activity: Not Currently    Birth control/protection: None  Lifestyle  . Physical activity:    Days per week: Not on file    Minutes per session: Not on file  . Stress: Only a little  Relationships  . Social connections:    Talks on phone: Not on file    Gets together: Not on file    Attends religious service: Not on file    Active member of club or organization: Not on file    Attends meetings of clubs or organizations: Not on file  Relationship status: Not on file  Other Topics Concern  . Not on file  Social History Narrative  . Not on file    Family History: Family History  Problem Relation Age of Onset  . Asthma Mother     Allergies: No Known Allergies  Medications Prior to Admission  Medication Sig Dispense Refill Last Dose  . aspirin 81 MG chewable tablet Chew 1 tablet by mouth.   Not Taking  . hydrocortisone 2.5 % cream Apply 1 application topically.   Taking  . Melatonin 3 MG TABS Take 1 tablet by mouth.   Taking  . miconazole (MONISTAT 7) 2 % vaginal cream Place 1 Applicatorful vaginally at bedtime. Apply for seven nights 30 g 2 Taking  . Nicotine (NICODERM CQ TD) Place onto the skin.   Not Taking  . Prenatal Vit-Fe Fumarate-FA (PNV PRENATAL PLUS MULTIVITAMIN) 27-1 MG TABS Take 1 tablet by mouth.   Taking     Review of Systems  All systems reviewed and negative except as stated in HPI  Blood  pressure (!) 145/92, pulse 86, temperature 98.2 F (36.8 C), temperature source Oral, height 5\' 9"  (1.753 m), weight 102.1 kg, last menstrual period 07/14/2017, unknown if currently breastfeeding. General appearance: well-appearing, NAD Lungs: no respiratory distress Heart: regular rate  Abdomen: soft, non-tender; gravid  Pelvic: deferred Extremities: no LE edema Presentation: cephalic by sutures Fetal monitoring: 120s/mod/+a/-d Uterine activity: irritability  Dilation: 1 Effacement (%): 50 Station: -3 Exam by:: Juleen China, L  Prenatal labs: ABO, Rh: --/--/A POS (02/24 0800) Antibody: NEG (02/24 0800) Rubella:  pending, negative IgG RPR: Non Reactive (11/27 0818)  HBsAg:   non-reactive (see care everywhere) HIV: Non Reactive (11/27 0818)  GBS:   negative Glucola: normal 2-hr Genetic screening:  Not done  Prenatal Transfer Tool  Maternal Diabetes: No Genetic Screening: not done Maternal Ultrasounds/Referrals: see above, required fetal ECHO for abnormal vascular ring Fetal Ultrasounds or other Referrals:  Fetal echo (see above, needs TTE after birth for newborn) Maternal Substance Abuse: EtOH use in early pregnancy (<14 weeks when found out pregnant)  tobacco use  Significant Maternal Medications:  Aspirin  melatonin  monistatic  PNV  admitted for sepsis and on multiple antibiotics / antivirals  Significant Maternal Lab Results: GBS(-)  history of MRSA colonization (treated)   Results for orders placed or performed during the hospital encounter of 04/18/18 (from the past 24 hour(s))  CBC   Collection Time: 04/18/18  8:00 AM  Result Value Ref Range   WBC 11.1 (H) 4.0 - 10.5 K/uL   RBC 4.71 3.87 - 5.11 MIL/uL   Hemoglobin 14.0 12.0 - 15.0 g/dL   HCT 42.3 36.0 - 46.0 %   MCV 89.8 80.0 - 100.0 fL   MCH 29.7 26.0 - 34.0 pg   MCHC 33.1 30.0 - 36.0 g/dL   RDW 14.3 11.5 - 15.5 %   Platelets 258 150 - 400 K/uL   nRBC 0.0 0.0 - 0.2 %  Type and screen   Collection Time:  04/18/18  8:00 AM  Result Value Ref Range   ABO/RH(D) A POS    Antibody Screen NEG    Sample Expiration 04/21/2018   MRSA PCR Screening   Collection Time: 04/18/18 10:49 AM  Result Value Ref Range   MRSA by PCR POSITIVE (A) NEGATIVE    Patient Active Problem List   Diagnosis Date Noted  . Personal history of previous postdates pregnancy 04/18/2018  . History of MRSA infection 03/14/2018  . Obesity  12/22/2017  . History of sepsis this pregnancy 12/22/2017  . Tobacco use 12/22/2017  . 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 Supervision of high risk pregnancy, antepartum 12/13/2017  . 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 Trichomonal vaginitis during pregnancy in second trimester 11/15/2017  . Maternal age 48+, primigravida, antepartum, second trimester 11/11/2017  . Fetal cardiac anomaly complicating pregnancy, antepartum, single gestation 11/11/2017    Assessment/Plan:  Akiya Morr is a 38 y.o. G1P0 at [redacted]w[redacted]d here for [facility].   Labor: Induction to start with FB and cytotec. FB placed without difficulty.  -- pain control: desires epidural, fentanyl IV for pain until then   Fetal Wellbeing: EFW 7-8lbs by Leopold's. Cephalic by sutures.  -- GBS (negative) -- continuous fetal monitoring - category I  -- possible fetal cardiac anomaly - needs TTE as newborn   Postpartum Planning -- breast/Nexplanon -- Rubella status unknown/[x] Tdap/[ ]  declined flu shot   Elevated Intrapartum Blood Pressures: Will check CMP, CBC wnl.  -- continue to monitor closely   Khyren Hing S. Juleen China, DO OB/GYN Fellow

## 2018-04-18 NOTE — Progress Notes (Signed)
Catherine Cannon is a 38 y.o. G1P0 at [redacted]w[redacted]d.  Subjective: Patient with mild pain with contractions resting comfortably on her side.   Objective: BP (!) 146/76   Pulse 96   Temp 99.2 F (37.3 C) (Oral)   Resp 16   Ht 5\' 9"  (1.753 m)   Wt 102.1 kg   LMP 07/14/2017 (Exact Date) Comment: irregular  SpO2 99%   BMI 33.23 kg/m    FHT:  FHR: 145 bpm, variability: mod,  accelerations:  present,  decelerations: variables present Dilation: 4.5 Effacement (%): 80 Station: -3 Presentation: Vertex Exam by:: Leroy Libman, RN  Labs: Results for orders placed or performed during the hospital encounter of 04/18/18 (from the past 24 hour(s))  Type and screen     Status: None   Collection Time: 04/18/18  8:00 AM  Result Value Ref Range   ABO/RH(D) A POS    Antibody Screen NEG    Sample Expiration 04/21/2018   CBC     Status: Abnormal   Collection Time: 04/18/18  8:00 AM  Result Value Ref Range   WBC 11.1 (H) 4.0 - 10.5 K/uL   RBC 4.71 3.87 - 5.11 MIL/uL   Hemoglobin 14.0 12.0 - 15.0 g/dL   HCT 42.3 36.0 - 46.0 %   MCV 89.8 80.0 - 100.0 fL   MCH 29.7 26.0 - 34.0 pg   MCHC 33.1 30.0 - 36.0 g/dL   RDW 14.3 11.5 - 15.5 %   Platelets 258 150 - 400 K/uL   nRBC 0.0 0.0 - 0.2 %  Comprehensive metabolic panel     Status: Abnormal   Collection Time: 04/18/18  8:56 AM  Result Value Ref Range   Sodium 134 (L) 135 - 145 mmol/L   Potassium 4.0 3.5 - 5.1 mmol/L   Chloride 104 98 - 111 mmol/L   CO2 21 (L) 22 - 32 mmol/L   Glucose, Bld 83 70 - 99 mg/dL   BUN 5 (L) 6 - 20 mg/dL   Creatinine, Ser 0.54 0.44 - 1.00 mg/dL   Calcium 9.3 8.9 - 10.3 mg/dL   Total Protein 6.0 (L) 6.5 - 8.1 g/dL   Albumin 2.8 (L) 3.5 - 5.0 g/dL   AST 16 15 - 41 U/L   ALT 20 0 - 44 U/L   Alkaline Phosphatase 161 (H) 38 - 126 U/L   Total Bilirubin 0.1 (L) 0.3 - 1.2 mg/dL   GFR calc non Af Amer >60 >60 mL/min   GFR calc Af Amer >60 >60 mL/min   Anion gap 9 5 - 15  MRSA PCR Screening     Status: Abnormal   Collection  Time: 04/18/18 10:49 AM  Result Value Ref Range   MRSA by PCR POSITIVE (A) NEGATIVE  Wet prep, genital     Status: Abnormal   Collection Time: 04/18/18  1:32 PM  Result Value Ref Range   Yeast Wet Prep HPF POC NONE SEEN NONE SEEN   Trich, Wet Prep PRESENT (A) NONE SEEN   Clue Cells Wet Prep HPF POC NONE SEEN NONE SEEN   WBC, Wet Prep HPF POC MODERATE (A) NONE SEEN   Sperm NONE SEEN     Assessment / Plan: [redacted]w[redacted]d week IUP GHTN: No severe range pressures, consistently in 140s SBP Labor: cont on pitocin, consider inserting IUPC Fetal Wellbeing:  Category 2 Pain Control:  Epidural in place Anticipated MOD:  SVD  Nuala Alpha, DO  [facility], PGY-2 04/18/2018 9:43 PM

## 2018-04-18 NOTE — Anesthesia Preprocedure Evaluation (Signed)
Anesthesia Evaluation  Patient identified by MRN, date of birth, ID band Patient awake    Reviewed: Allergy & Precautions, NPO status , Patient's Chart, lab work & pertinent test results  Airway Mallampati: II  TM Distance: >3 FB Neck ROM: Full    Dental no notable dental hx.    Pulmonary neg pulmonary ROS, Current Smoker,    Pulmonary exam normal breath sounds clear to auscultation       Cardiovascular negative cardio ROS Normal cardiovascular exam Rhythm:Regular Rate:Normal     Neuro/Psych negative neurological ROS  negative psych ROS   GI/Hepatic negative GI ROS, Neg liver ROS,   Endo/Other  negative endocrine ROS  Renal/GU negative Renal ROS  negative genitourinary   Musculoskeletal negative musculoskeletal ROS (+)   Abdominal   Peds negative pediatric ROS (+)  Hematology negative hematology ROS (+)   Anesthesia Other Findings   Reproductive/Obstetrics (+) Pregnancy                             Anesthesia Physical Anesthesia Plan  ASA: II  Anesthesia Plan: Epidural   Post-op Pain Management:    Induction:   PONV Risk Score and Plan:   Airway Management Planned:   Additional Equipment:   Intra-op Plan:   Post-operative Plan:   Informed Consent:   Plan Discussed with:   Anesthesia Plan Comments:         Anesthesia Quick Evaluation

## 2018-04-18 NOTE — Anesthesia Pain Management Evaluation Note (Signed)
  CRNA Pain Management Visit Note  Patient: Catherine Cannon, 38 y.o., female  "Hello I am a member of the anesthesia team at [facility]. We have an anesthesia team available at all times to provide care throughout the hospital, including epidural management and anesthesia for C-section. I don't know your plan for the delivery whether it a natural birth, water birth, IV sedation, nitrous supplementation, doula or epidural, but we want to meet your pain goals."   1.Was your pain managed to your expectations on prior hospitalizations?   No prior hospitalizations  2.What is your expectation for pain management during this hospitalization?     Epidural  3.How can we help you reach that goal? Place epidural at pain goal.  Record the patient's initial score and the patient's pain goal.   Pain: 2  Pain Goal: 6 [facility] wants you to be able to say your pain was always managed very well.  Tiffeny Minchew 04/18/2018

## 2018-04-19 ENCOUNTER — Other Ambulatory Visit: Payer: Self-pay

## 2018-04-19 ENCOUNTER — Encounter (HOSPITAL_COMMUNITY): Payer: Self-pay

## 2018-04-19 DIAGNOSIS — Z3A41 41 weeks gestation of pregnancy: Secondary | ICD-10-CM

## 2018-04-19 DIAGNOSIS — O480 Post-term pregnancy: Secondary | ICD-10-CM

## 2018-04-19 LAB — RPR: RPR Ser Ql: NONREACTIVE

## 2018-04-19 LAB — TYPE AND SCREEN
ABO/RH(D): A POS
Antibody Screen: NEGATIVE

## 2018-04-19 LAB — RUBELLA ANTIBODY, IGM: Rubella IgM: <20 AU/mL (ref 0.0–19.9)

## 2018-04-19 MED ORDER — WITCH HAZEL-GLYCERIN EX PADS: 1.0000 "application " | MEDICATED_PAD | CUTANEOUS | Status: DC | PRN

## 2018-04-19 MED ORDER — IBUPROFEN 600 MG PO TABS
600.0000 mg | ORAL_TABLET | Freq: Four times a day (QID) | ORAL | Status: DC
  Administered 2018-04-19 – 2018-04-21 (×9): 600 mg via ORAL
  Filled 2018-04-19 (×9): qty 1

## 2018-04-19 MED ORDER — ZOLPIDEM TARTRATE 5 MG PO TABS: 5.0000 mg | ORAL_TABLET | Freq: Every evening | ORAL | Status: DC | PRN

## 2018-04-19 MED ORDER — TETANUS-DIPHTH-ACELL PERTUSSIS 5-2.5-18.5 LF-MCG/0.5 IM SUSP: 0.5000 mL | Freq: Once | INTRAMUSCULAR | Status: DC

## 2018-04-19 MED ORDER — DIBUCAINE 1 % RE OINT: 1.0000 "application " | TOPICAL_OINTMENT | RECTAL | Status: DC | PRN

## 2018-04-19 MED ORDER — SIMETHICONE 80 MG PO CHEW: 80.0000 mg | CHEWABLE_TABLET | ORAL | Status: DC | PRN

## 2018-04-19 MED ORDER — SENNOSIDES-DOCUSATE SODIUM 8.6-50 MG PO TABS
2.0000 | ORAL_TABLET | ORAL | Status: DC
  Administered 2018-04-20 – 2018-04-21 (×2): 2 via ORAL
  Filled 2018-04-19 (×2): qty 2

## 2018-04-19 MED ORDER — COCONUT OIL OIL: 1.0000 "application " | TOPICAL_OIL | Status: DC | PRN

## 2018-04-19 MED ORDER — PRENATAL MULTIVITAMIN CH
1.0000 | ORAL_TABLET | Freq: Every day | ORAL | Status: DC
  Administered 2018-04-19 – 2018-04-21 (×3): 1 via ORAL
  Filled 2018-04-19 (×3): qty 1

## 2018-04-19 MED ORDER — SODIUM CHLORIDE 0.9 % IV SOLN
2.0000 g | Freq: Once | INTRAVENOUS | Status: AC
  Administered 2018-04-19: 2 g via INTRAVENOUS
  Filled 2018-04-19: qty 2000

## 2018-04-19 MED ORDER — DIPHENHYDRAMINE HCL 25 MG PO CAPS: 25.0000 mg | ORAL_CAPSULE | Freq: Four times a day (QID) | ORAL | Status: DC | PRN

## 2018-04-19 MED ORDER — ONDANSETRON HCL 4 MG/2ML IJ SOLN: 4.0000 mg | INTRAMUSCULAR | Status: DC | PRN

## 2018-04-19 MED ORDER — BENZOCAINE-MENTHOL 20-0.5 % EX AERO
1.0000 "application " | INHALATION_SPRAY | CUTANEOUS | Status: DC | PRN
  Administered 2018-04-19: 1 via TOPICAL
  Filled 2018-04-19 (×2): qty 56

## 2018-04-19 MED ORDER — ONDANSETRON HCL 4 MG PO TABS: 4.0000 mg | ORAL_TABLET | ORAL | Status: DC | PRN

## 2018-04-19 MED ORDER — ACETAMINOPHEN 325 MG PO TABS
650.0000 mg | ORAL_TABLET | ORAL | Status: DC | PRN
  Administered 2018-04-19 – 2018-04-20 (×2): 650 mg via ORAL
  Filled 2018-04-19 (×2): qty 2

## 2018-04-19 NOTE — Progress Notes (Signed)
LABOR PROGRESS NOTE  Catherine Cannon is a 38 y.o. G1P0 at [redacted]w[redacted]d  admitted for IOL for PD   Subjective: Patient doing well, comfortable with epidural at this time   Objective: BP (!) 153/83   Pulse (!) 101   Temp 98.3 F (36.8 C) (Oral)   Resp 16   Ht 5\' 9"  (1.753 m)   Wt 102.1 kg   LMP 07/14/2017 (Exact Date) Comment: irregular  SpO2 99%   BMI 33.23 kg/m  or  Vitals:   04/18/18 2130 04/18/18 2200 04/18/18 2230 04/18/18 2300  BP: (!) 146/84 (!) 148/86 134/72 (!) 153/83  Pulse: 97 (!) 101 99 (!) 101  Resp: 16 16 16 16   Temp:      TempSrc:      SpO2:      Weight:      Height:        IUPC and FSE placed at 2331 Dilation: 6 Effacement (%): 90 Station: -1 Presentation: Vertex Exam by:: Wende Bushy CNM  FHT: baseline rate 130, moderate varibility, +accel, variable and early decel Toco: 2-3/ moderate by palpation   Labs: Lab Results  Component Value Date   WBC 11.1 (H) 04/18/2018   HGB 14.0 04/18/2018   HCT 42.3 04/18/2018   MCV 89.8 04/18/2018   PLT 258 04/18/2018    Patient Active Problem List   Diagnosis Date Noted  . Personal history of previous postdates pregnancy 04/18/2018  . History of MRSA infection 03/14/2018  . Obesity 12/22/2017  . History of sepsis this pregnancy 12/22/2017  . Tobacco use 12/22/2017  . 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 Supervision of high risk pregnancy, antepartum 12/13/2017  . 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 Trichomonal vaginitis during pregnancy in second trimester 11/15/2017  . Maternal age 91+, primigravida, antepartum, second trimester 11/11/2017  . Fetal cardiac anomaly complicating pregnancy, antepartum, single gestation 11/11/2017    Assessment / Plan: 38 y.o. G1P0 at [redacted]w[redacted]d here for IOL for PD  Labor: Progressing well  Fetal Wellbeing:  Cat II  Pain Control:  Epidural  Anticipated MOD:  SVD   Lajean Manes, CNM 04/19/2018, 12:16 AM

## 2018-04-19 NOTE — Progress Notes (Signed)
Catherine Cannon is a 38 y.o. G1P0 at [redacted]w[redacted]d.  Subjective: Patient is uncomfortable and feeling lots of pressure in her bottom. Contractions are becoming more intense and more frequent.  Objective: BP (!) 153/87   Pulse (!) 106   Temp 98.3 F (36.8 C) (Oral)   Resp 18   Ht 5\' 9"  (1.753 m)   Wt 102.1 kg   LMP 07/14/2017 (Exact Date) Comment: irregular  SpO2 98%   BMI 33.23 kg/m    FHT:  FHR: 150 bpm, variability: mod,  accelerations:  present,  decelerations:  Variables present UC:   Q 28minutes, irregular Dilation: 8 Effacement (%): 80 Station: -1, -2 Presentation: Vertex Exam by:: Leroy Libman, RN  Labs: Results for orders placed or performed during the hospital encounter of 04/18/18 (from the past 24 hour(s))  Type and screen     Status: None   Collection Time: 04/18/18  8:00 AM  Result Value Ref Range   ABO/RH(D) A POS    Antibody Screen NEG    Sample Expiration 04/21/2018   CBC     Status: Abnormal   Collection Time: 04/18/18  8:00 AM  Result Value Ref Range   WBC 11.1 (H) 4.0 - 10.5 K/uL   RBC 4.71 3.87 - 5.11 MIL/uL   Hemoglobin 14.0 12.0 - 15.0 g/dL   HCT 42.3 36.0 - 46.0 %   MCV 89.8 80.0 - 100.0 fL   MCH 29.7 26.0 - 34.0 pg   MCHC 33.1 30.0 - 36.0 g/dL   RDW 14.3 11.5 - 15.5 %   Platelets 258 150 - 400 K/uL   nRBC 0.0 0.0 - 0.2 %  Comprehensive metabolic panel     Status: Abnormal   Collection Time: 04/18/18  8:56 AM  Result Value Ref Range   Sodium 134 (L) 135 - 145 mmol/L   Potassium 4.0 3.5 - 5.1 mmol/L   Chloride 104 98 - 111 mmol/L   CO2 21 (L) 22 - 32 mmol/L   Glucose, Bld 83 70 - 99 mg/dL   BUN 5 (L) 6 - 20 mg/dL   Creatinine, Ser 0.54 0.44 - 1.00 mg/dL   Calcium 9.3 8.9 - 10.3 mg/dL   Total Protein 6.0 (L) 6.5 - 8.1 g/dL   Albumin 2.8 (L) 3.5 - 5.0 g/dL   AST 16 15 - 41 U/L   ALT 20 0 - 44 U/L   Alkaline Phosphatase 161 (H) 38 - 126 U/L   Total Bilirubin 0.1 (L) 0.3 - 1.2 mg/dL   GFR calc non Af Amer >60 >60 mL/min   GFR calc Af Amer >60  >60 mL/min   Anion gap 9 5 - 15  MRSA PCR Screening     Status: Abnormal   Collection Time: 04/18/18 10:49 AM  Result Value Ref Range   MRSA by PCR POSITIVE (A) NEGATIVE  Wet prep, genital     Status: Abnormal   Collection Time: 04/18/18  1:32 PM  Result Value Ref Range   Yeast Wet Prep HPF POC NONE SEEN NONE SEEN   Trich, Wet Prep PRESENT (A) NONE SEEN   Clue Cells Wet Prep HPF POC NONE SEEN NONE SEEN   WBC, Wet Prep HPF POC MODERATE (A) NONE SEEN   Sperm NONE SEEN     Assessment / Plan: [redacted]w[redacted]d week IUP here for IOL for PD Labor: progressing on pitocin Fetal Wellbeing:  Category 2; IUPC and FSE placed Pain Control:  Epidural Anticipated MOD:  SVD  Nuala Alpha, DO 04/19/2018 3:04  AM    

## 2018-04-19 NOTE — Lactation Note (Signed)
This note was copied from a baby's chart. Lactation Consultation Note  Patient Name: Catherine Cannon Today's Date: 04/19/2018 Reason for consult: Initial assessment;Primapara;1st time breastfeeding;Term  71 hours old FT female who is being exclusively BF by his mother, she's a P1. Mom in contact precautions. She is already familiar with hand expression and able to see colostrum, she didn't take any BF classes during the pregnancy but she voiced to [facility] that she read about BF during the pregnancy. She has [place] at home. Mom asked LC to show her how to use the hospital pump so she becomes familiar with her DEBP at home.  LC offered to set it up but mom said she's not going to pump here at the hospital, she just wanted to see how the pump works. LC explained to mom that the [place] she has at home probably works slightly different than our hospital grade pump. LC went over some pointers on the set up and instructions.   Baby swaddled and asleep on visitors arms, offered assistance with latch but mom politely declined, she didn't want her visitors to leave, but she did request lactation to come back tomorrow to work on the latch and hand expression. Asked mom to call for assistance when needed. Let mom know that the lactation crew will be back sometime tomorrow. Discussed normal newborn behavior and feeding cues.  Feeding plan:  1. Encouraged mom to feed baby 8-12 times/24 hours or sooner if feeding cues are present 2. Hand expression and spoon feeding was also encouraged  BF brochure, BF resources and feeding diary were reviewed. Mom reported all questions and concerns were answered, they're both aware of [facility] services and will call PRN.  Maternal Data Formula Feeding for Exclusion: No Has patient been taught Hand Expression?: Yes Does the patient have breastfeeding experience prior to this delivery?: No  Feeding Feeding Type: Breast Fed  LATCH Score Latch: Repeated attempts needed to  sustain latch, nipple held in mouth throughout feeding, stimulation needed to elicit sucking reflex.  Audible Swallowing: A few with stimulation  Type of Nipple: Everted at rest and after stimulation  Comfort (Breast/Nipple): Soft / non-tender  Hold (Positioning): Assistance needed to correctly position infant at breast and maintain latch.  LATCH Score: 7  Interventions Interventions: Breast feeding basics reviewed;DEBP  Lactation Tools Discussed/Used WIC Program: No   Consult Status Consult Status: Follow-up Date: 04/20/18 Follow-up type: In-patient    Catherine Cannon 04/19/2018, 4:43 PM

## 2018-04-19 NOTE — Anesthesia Postprocedure Evaluation (Signed)
Anesthesia Post Note  Patient: Catherine Cannon  Procedure(s) Performed: AN AD [place]     Patient location during evaluation: Mother Baby Anesthesia Type: Epidural Level of consciousness: awake and alert and oriented Pain management: satisfactory to patient Vital Signs Assessment: post-procedure vital signs reviewed and stable Respiratory status: respiratory function stable Cardiovascular status: stable Postop Assessment: no headache, no backache, epidural receding, patient able to bend at knees, no signs of nausea or vomiting and adequate PO intake Anesthetic complications: no    Last Vitals:  Vitals:   04/19/18 1730 04/19/18 1732  BP: 133/70 127/68  Pulse: 88   Resp:    Temp: 37 C   SpO2:      Last Pain:  Vitals:   04/19/18 1730  TempSrc: Oral  PainSc: 4    Pain Goal:                Epidural/Spinal Function Cutaneous sensation: Normal sensation (04/19/18 1730)  [name]

## 2018-04-20 NOTE — Progress Notes (Signed)
POSTPARTUM PROGRESS NOTE  Post Partum Day 1  Subjective:  Catherine Cannon is a 38 y.o. G1P1001 s/p SVD at [redacted]w[redacted]d.  She reports she is doing well. No acute events overnight. She denies any problems with ambulating, voiding or po intake. Denies nausea or vomiting.  Pain is well controlled.  Lochia is appropriate.  Objective: Blood pressure 130/76, pulse 89, temperature 97.9 F (36.6 C), temperature source Oral, resp. rate 18, height 5\' 9"  (1.753 m), weight 102.1 kg, last menstrual period 07/14/2017, SpO2 98 %, unknown if currently breastfeeding.  Physical Exam:  General: alert, cooperative and no distress Chest: no respiratory distress Heart:regular rate, distal pulses intact Abdomen: soft, nontender,  Uterine Fundus: firm, appropriately tender DVT Evaluation: No calf swelling or tenderness Extremities: No LE edema Skin: warm, dry  Recent Labs    04/18/18 0800  HGB 14.0  HCT 42.3    Assessment/Plan: Catherine Cannon is a 38 y.o. G1P1001 s/p SVD at [redacted]w[redacted]d   PPD#1 - Doing well  Routine postpartum care Patient with intrapartum gHTN. BPs currently stable. Continue to monitor.  Contraception: Nexplanon  Feeding: Breast  Dispo: Plan for discharge PPD#2.   LOS: 2 days   Phill Myron, D.O. OB Fellow  04/20/2018, 12:38 PM

## 2018-04-21 MED ORDER — SENNOSIDES-DOCUSATE SODIUM 8.6-50 MG PO TABS: 2.0000 | ORAL_TABLET | ORAL | 0 refills | Status: DC

## 2018-04-21 MED ORDER — MEASLES, MUMPS & RUBELLA VAC IJ SOLR
0.5000 mL | Freq: Once | INTRAMUSCULAR | Status: AC
  Administered 2018-04-21: 0.5 mL via SUBCUTANEOUS
  Filled 2018-04-21: qty 0.5

## 2018-04-21 MED ORDER — IBUPROFEN 600 MG PO TABS: 600.0000 mg | ORAL_TABLET | Freq: Four times a day (QID) | ORAL | 0 refills | Status: DC

## 2018-04-21 NOTE — Discharge Summary (Signed)
OB Discharge Summary     Patient Name: Catherine Cannon DOB: 12-28-1980 MRN: [id number]  Date of admission: 04/18/2018 Delivering MD: Nuala Alpha   Date of discharge: 04/21/2018  Admitting diagnosis: preadmission [place] Intrauterine pregnancy: [redacted]w[redacted]d     Secondary diagnosis:  Principal Problem:   Personal history of previous postdates pregnancy Active Problems:   Alcohol use affecting pregnancy   Maternal age 38+, primigravida, antepartum, second trimester   Trichomonal vaginitis during pregnancy in second trimester   Fetal cardiac anomaly complicating pregnancy, antepartum, single gestation   Obesity   History of sepsis this pregnancy   Tobacco use   Intramural uterine fibroid - Retroplacental    History of MRSA infection   SVD (spontaneous vaginal delivery)  Additional problems:  gHTN + for Trich; treated before delivery Retained placenta needing manual extraction AMA ETOH and tobacco use History of sepsis in pregnancy     Discharge diagnosis: Term Pregnancy Delivered and gHTN                                                                                                Post partum procedures:none  Augmentation: AROM, Pitocin and Cytotec  Complications: None  Hospital course:  Induction of Labor With Vaginal Delivery   38 y.o. yo G1P1001 at [redacted]w[redacted]d was admitted to the hospital 04/18/2018 for induction of labor.  Indication for induction: Postdates and Gestational hypertension.  Patient had an uncomplicated labor course as follows: Membrane Rupture Time/Date: 2:12 PM ,04/18/2018   Intrapartum Procedures: Episiotomy: None [1]                                         Lacerations:  3rd degree [4]  Patient had delivery of a Viable infant.  Information for the patient's newborn:  Latica, Hohmann [[id number]]  Delivery Method: Vag-Spont   04/19/2018  Details of delivery can be found in separate delivery note.  Patient had a routine postpartum course. Patient is  discharged home 04/21/18.  Physical exam  Vitals:   04/20/18 1609 04/20/18 1900 04/20/18 2230 04/21/18 0619  BP:  138/81 138/79 133/82  Pulse: 89 72 79 79  Resp: 20 19 18 16   Temp: 97.8 F (36.6 C) 98.1 F (36.7 C) 98.3 F (36.8 C) 97.9 F (36.6 C)  TempSrc: Oral Oral Oral Oral  SpO2:      Weight:      Height:       General: alert, cooperative and no distress Lochia: appropriate Uterine Fundus: firm Incision: N/A DVT Evaluation: No evidence of DVT seen on physical exam. Negative Homan's sign. No significant calf/ankle edema. Labs: Lab Results  Component Value Date   WBC 11.1 (H) 04/18/2018   HGB 14.0 04/18/2018   HCT 42.3 04/18/2018   MCV 89.8 04/18/2018   PLT 258 04/18/2018   CMP Latest Ref Rng & Units 04/18/2018  Glucose 70 - 99 mg/dL 83  BUN 6 - 20 mg/dL 5(L)  Creatinine 0.44 - 1.00 mg/dL 0.54  Sodium 135 - 145 mmol/L  134(L)  Potassium 3.5 - 5.1 mmol/L 4.0  Chloride 98 - 111 mmol/L 104  CO2 22 - 32 mmol/L 21(L)  Calcium 8.9 - 10.3 mg/dL 9.3  Total Protein 6.5 - 8.1 g/dL 6.0(L)  Total Bilirubin 0.3 - 1.2 mg/dL 0.1(L)  Alkaline Phos 38 - 126 U/L 161(H)  AST 15 - 41 U/L 16  ALT 0 - 44 U/L 20    Discharge instruction: per After Visit Summary and "Baby and Me Booklet".  After visit meds:  Allergies as of 04/21/2018   No Known Allergies     Medication List    STOP taking these medications   miconazole 2 % vaginal cream Commonly known as:  MONISTAT 7     TAKE these medications   acetaminophen 500 MG tablet Commonly known as:  TYLENOL Take 500 mg by mouth every 4 (four) hours as needed for mild pain.   hydrocortisone 2.5 % cream Apply 1 application topically.   ibuprofen 600 MG tablet Commonly known as:  ADVIL,MOTRIN Take 1 tablet (600 mg total) by mouth every 6 (six) hours.   Melatonin 3 MG Tabs Take 1 tablet by mouth.   NICODERM CQ TD Place 1 patch onto the skin daily.   prenatal multivitamin Tabs tablet Take 1 tablet by mouth at  bedtime.   senna-docusate 8.6-50 MG tablet Commonly known as:  Senokot-S Take 2 tablets by mouth daily. Start taking on:  April 22, 2018       Diet: routine diet  Activity: Advance as tolerated. Pelvic rest for 6 weeks.   Outpatient follow up:4 weeks Follow up Appt: Future Appointments  Date Time Provider [place]  04/26/2018  3:15 PM [place] Clarkson  05/17/2018  3:35 PM Jorje Guild, NP [facility]   Follow up Visit:No follow-ups on file.  Postpartum contraception: Nexplanon  Newborn Data: Live born female  Birth Weight: 6 lb 8.4 oz (2960 g) APGAR: 8, 9  Newborn Delivery   Birth date/time:  04/19/2018 05:09:00 Delivery type:  Vaginal, Spontaneous     Baby Feeding: Breast Disposition:home with mother   04/21/2018 Nuala Alpha, DO  [place], PGY-2

## 2018-04-21 NOTE — Discharge Instructions (Signed)
Vaginal Delivery, Care After °Refer to this sheet in the next few weeks. These instructions provide you with information about caring for yourself after vaginal delivery. Your health care provider may also give you more specific instructions. Your treatment has been planned according to current medical practices, but problems sometimes occur. Call your health care provider if you have any problems or questions. °What can I expect after the procedure? °After vaginal delivery, it is common to have: °· Some bleeding from your vagina. °· Soreness in your abdomen, your vagina, and the area of skin between your vaginal opening and your anus (perineum). °· Pelvic cramps. °· Fatigue. °Follow these instructions at home: °Medicines °· Take over-the-counter and prescription medicines only as told by your health care provider. °· If you were prescribed an antibiotic medicine, take it as told by your health care provider. Do not stop taking the antibiotic until it is finished. °Driving ° °· Do not drive or operate heavy machinery while taking prescription pain medicine. °· Do not drive for 24 hours if you received a sedative. °Lifestyle °· Do not drink alcohol. This is especially important if you are breastfeeding or taking medicine to relieve pain. °· Do not use tobacco products, including cigarettes, chewing tobacco, or e-cigarettes. If you need help quitting, ask your health care provider. °Eating and drinking °· Drink at least 8 eight-ounce glasses of water every day unless you are told not to by your health care provider. If you choose to breastfeed your baby, you may need to drink more water than this. °· Eat high-fiber foods every day. These foods may help prevent or relieve constipation. High-fiber foods include: °? Whole grain cereals and breads. °? Brown rice. °? Beans. °? Fresh fruits and vegetables. °Activity °· Return to your normal activities as told by your health care provider. Ask your health care provider what  activities are safe for you. °· Rest as much as possible. Try to rest or take a nap when your baby is sleeping. °· Do not lift anything that is heavier than your baby or 10 lb (4.5 kg) until your health care provider says that it is safe. °· Talk with your health care provider about when you can engage in sexual activity. This may depend on your: °? Risk of infection. °? Rate of healing. °? Comfort and desire to engage in sexual activity. °Vaginal Care °· If you have an episiotomy or a vaginal tear, check the area every day for signs of infection. Check for: °? More redness, swelling, or pain. °? More fluid or blood. °? Warmth. °? Pus or a bad smell. °· Do not use tampons or douches until your health care provider says this is safe. °· Watch for any blood clots that may pass from your vagina. These may look like clumps of dark red, brown, or black discharge. °General instructions °· Keep your perineum clean and dry as told by your health care provider. °· Wear loose, comfortable clothing. °· Wipe from front to back when you use the toilet. °· Ask your health care provider if you can shower or take a bath. If you had an episiotomy or a perineal tear during labor and delivery, your health care provider may tell you not to take baths for a certain length of time. °· Wear a bra that supports your breasts and fits you well. °· If possible, have someone help you with household activities and help care for your baby for at least a few days after you   leave the hospital. °· Keep all follow-up visits for you and your baby as told by your health care provider. This is important. °Contact a health care provider if: °· You have: °? Vaginal discharge that has a bad smell. °? Difficulty urinating. °? Pain when urinating. °? A sudden increase or decrease in the frequency of your bowel movements. °? More redness, swelling, or pain around your episiotomy or vaginal tear. °? More fluid or blood coming from your episiotomy or vaginal  tear. °? Pus or a bad smell coming from your episiotomy or vaginal tear. °? A fever. °? A rash. °? Little or no interest in activities you used to enjoy. °? Questions about caring for yourself or your baby. °· Your episiotomy or vaginal tear feels warm to the touch. °· Your episiotomy or vaginal tear is separating or does not appear to be healing. °· Your breasts are painful, hard, or turn red. °· You feel unusually sad or worried. °· You feel nauseous or you vomit. °· You pass large blood clots from your vagina. If you pass a blood clot from your vagina, save it to show to your health care provider. Do not flush blood clots down the toilet without having your health care provider look at them. °· You urinate more than usual. °· You are dizzy or light-headed. °· You have not breastfed at all and you have not had a menstrual period for 12 weeks after delivery. °· You have stopped breastfeeding and you have not had a menstrual period for 12 weeks after you stopped breastfeeding. °Get help right away if: °· You have: °? Pain that does not go away or does not get better with medicine. °? Chest pain. °? Difficulty breathing. °? Blurred vision or spots in your vision. °? Thoughts about hurting yourself or your baby. °· You develop pain in your abdomen or in one of your legs. °· You develop a severe headache. °· You faint. °· You bleed from your vagina so much that you fill two sanitary pads in one hour. °This information is not intended to replace advice given to you by your health care provider. Make sure you discuss any questions you have with your health care provider. °Document Released: 02/07/2000 Document Revised: 07/24/2015 Document Reviewed: 02/24/2015 °Elsevier Interactive Patient Education © 2019 [employer] ° °

## 2018-04-21 NOTE — Progress Notes (Signed)
CLINICAL SOCIAL WORK MATERNAL/CHILD NOTE  Patient Details  Name: Catherine Cannon MRN: [id number] Date of Birth: 04/19/2018  Date:  04/21/2018  Clinical Social Worker Initiating Note:  Thijs Brunton, LCSW Date/Time: Initiated:  04/21/18/1211     Child's Name:  Catherine Cannon   Biological Parents:  Mother, Father(Father: Catherine Cannon)   Need for Interpreter:  None   Reason for Referral:  Current Substance Use/Substance Use During Pregnancy (Etoh use in the beginning of pregnancy)   Address:  [address] [place] [address]    Phone number:  [phone] (home)     Additional phone number:   Household Members/Support Persons (HM/SP):   Household Member/Support Person 1   HM/SP Name Relationship DOB or Age  HM/SP -1 Catherine Cannon FOB    HM/SP -2        HM/SP -3        HM/SP -4        HM/SP -5        HM/SP -6        HM/SP -7        HM/SP -8          Natural Supports (not living in the home):  Parent   Professional Supports: None   Employment: Part-time   Type of Work: [employer]   Education:  Other (comment)(GED)   Homebound arranged:    Financial Resources:  Medicaid   Other Resources:  (Plans to apply for WIC)   Cultural/Religious Considerations Which May Impact Care:    Strengths:  Ability to meet basic needs , Home prepared for child , Pediatrician chosen   Psychotropic Medications:         Pediatrician:    [place] area  Pediatrician List:   [facility]  [place]    [place]    [place]    [place]    [place]      Pediatrician Fax Number:    Risk Factors/Current Problems:  None   Cognitive State:  Able to Concentrate , Alert , Linear Thinking , Goal Oriented    Mood/Affect:  Calm , Happy , Interested    CSW Assessment: CSW met with MOB at bedside to discuss consult for etoh use in pregnancy, FOB and friend present. CSW asked FOB and friend to  leave during assessment with MOB's permission, both left voluntarily. CSW introduced self and explained reason for consult. MOB was welcoming and engaged during assessment. 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 MOB reported that she plans to apply for WIC and has all items needed to care for the baby. CSW inquired about MOB's support system, MOB reported that FOB and her mother are her supports.  CSW inquired about MOB's mental health history, MOB denied any mental health history. MOB presented calm and did not demonstrate any acute mental health signs/symptoms. CSW assessed for safety, MOB denied SI, HI and domestic violence.   CSW provided education regarding the baby blues period vs. perinatal mood disorders, discussed treatment and gave resources for mental health follow up if concerns arise.  CSW recommends self-evaluation during the postpartum time period using the New Mom Checklist from Postpartum Progress and encouraged MOB to contact a medical professional if symptoms are noted at any time.    CSW provided review of Sudden Infant Death Syndrome (SIDS) precautions.    CSW inquired about MOB's etoh use, MOB reported that she was unaware she was pregnant   and that's why she was drinking. MOB reported that she got really sick then learned about her pregnancy and stopped drinking. MOB reported that she no longer plans to drink now that she has a baby.   CSW identifies no further need for intervention and no barriers to discharge at this time.  CSW Plan/Description:  No Further Intervention Required/No Barriers to Discharge, Sudden Infant Death Syndrome (SIDS) Education, Perinatal Mood and Anxiety Disorder (PMADs) Education    Pratyush Ammon L Macsen Nuttall, LCSW 04/21/2018, 12:13 PM  

## 2018-04-21 NOTE — Progress Notes (Signed)
CSW acknowledges consult and completed clinical assessment.  Clinical documentation will follow.  There are no barriers to d/c.  Abundio Miu, [place] Worker [facility] Cell#: [phone]

## 2018-04-21 NOTE — Lactation Note (Signed)
This note was copied from a baby's chart. Lactation Consultation Note  Patient Name: Catherine Cannon GYBWL'S Date: 04/21/2018 Reason for consult: Follow-up assessment;Primapara;1st time breastfeeding;Term  Visited with P1 Mom of term baby at 67 hrs old.  Baby at 4% weight loss on day of discharge.   Mom sitting on couch with swaddled baby and starting to latch baby in cross cradle hold without any pillow support.  Reviewed breast massage and hand expression, transitional milk easily expressed. Offered to assist with a more comfortable and supportive position.   Unwrapped baby for STS.  Positioned baby in football hold on right breast.  Assisted Mom with latching baby with a wide gape of mouth.  Baby very nutritive, and multiple swallows identified.  Mom taught how to use alternate breast compression to increase milk transfer. Encouraged STS and cue based feedings.  Goal of 8-12 feedings per 24 hrs.  Reviewed engorgement prevention and treatment. Mom aware of OP lactation support available and encouraged to call prn.   Consult Status Consult Status: Complete Date: 04/22/18 Follow-up type: Call as needed    Broadus John 04/21/2018, 11:53 AM

## 2018-04-26 ENCOUNTER — Ambulatory Visit: Payer: Medicaid Other

## 2018-05-13 ENCOUNTER — Telehealth: Payer: Self-pay | Admitting: *Deleted

## 2018-05-13 DIAGNOSIS — K0889 Other specified disorders of teeth and supporting structures: Secondary | ICD-10-CM

## 2018-05-13 DIAGNOSIS — K047 Periapical abscess without sinus: Secondary | ICD-10-CM

## 2018-05-13 MED ORDER — DICLOFENAC SODIUM 75 MG PO TBEC: 75.0000 mg | DELAYED_RELEASE_TABLET | Freq: Two times a day (BID) | ORAL | 0 refills | Status: DC

## 2018-05-13 MED ORDER — PENICILLIN V POTASSIUM 500 MG PO TABS: 500.0000 mg | ORAL_TABLET | Freq: Four times a day (QID) | ORAL | 0 refills | Status: DC

## 2018-05-13 NOTE — Telephone Encounter (Signed)
Catherine Cannon called and left a message this am that she has an appointment next week but doesn't want to wait until then. She states she would like [place] to call in prescription for her infected tooth. States she doesn't want to go to urgent care because she has just had a baby and worried about the coronavirus. Per chart had vaginal delivery in February and hx Sepsis,ARDS, pneumonia in early pregnancy in 09/2017.   I called Catherine Cannon and she states she doesn't have a dentist and she had an abcess earlier in pregnancy and was given antibiotic by [place] .  I discussed with Dr. Kennon Rounds who gave orders for penicillin , diclofenac and to use otc oragel. I informed Catherine Cannon we will send in the prescriptions and to take otc oragel. She vocies understanding.

## 2018-05-16 ENCOUNTER — Encounter: Payer: Self-pay | Admitting: *Deleted

## 2018-05-17 ENCOUNTER — Ambulatory Visit: Payer: Medicaid Other | Admitting: Student

## 2018-05-23 ENCOUNTER — Telehealth: Payer: Self-pay | Admitting: Obstetrics and Gynecology

## 2018-05-23 NOTE — Telephone Encounter (Signed)
Called the patient to inform of the COVID19 restrictions. Received the message "The patient you called has a voicemail box that has not been set up yet, Good-bye."

## 2018-05-24 ENCOUNTER — Other Ambulatory Visit: Payer: Self-pay

## 2018-05-24 ENCOUNTER — Ambulatory Visit (INDEPENDENT_AMBULATORY_CARE_PROVIDER_SITE_OTHER): Payer: Medicaid Other | Admitting: Student

## 2018-05-24 DIAGNOSIS — Z30013 Encounter for initial prescription of injectable contraceptive: Secondary | ICD-10-CM

## 2018-05-24 DIAGNOSIS — Z1389 Encounter for screening for other disorder: Secondary | ICD-10-CM

## 2018-05-24 MED ORDER — MEDROXYPROGESTERONE ACETATE 150 MG/ML IM SUSP
150.0000 mg | Freq: Once | INTRAMUSCULAR | Status: AC
  Administered 2018-05-24: 150 mg via INTRAMUSCULAR

## 2018-05-24 NOTE — Patient Instructions (Signed)
Health Maintenance, Female Adopting a healthy lifestyle and getting preventive care can go a long way to promote health and wellness. Talk with your health care provider about what schedule of regular examinations is right for you. This is a good chance for you to check in with your provider about disease prevention and staying healthy. In between checkups, there are plenty of things you can do on your own. Experts have done a lot of research about which lifestyle changes and preventive measures are most likely to keep you healthy. Ask your health care provider for more information. Weight and diet Eat a healthy diet  Be sure to include plenty of vegetables, fruits, low-fat dairy products, and lean protein.  Do not eat a lot of foods high in solid fats, added sugars, or salt.  Get regular exercise. This is one of the most important things you can do for your health. ? Most adults should exercise for at least 150 minutes each week. The exercise should increase your heart rate and make you sweat (moderate-intensity exercise). ? Most adults should also do strengthening exercises at least twice a week. This is in addition to the moderate-intensity exercise. Maintain a healthy weight  Body mass index (BMI) is a measurement that can be used to identify possible weight problems. It estimates body fat based on height and weight. Your health care provider can help determine your BMI and help you achieve or maintain a healthy weight.  For females 20 years of age and older: ? A BMI below 18.5 is considered underweight. ? A BMI of 18.5 to 24.9 is normal. ? A BMI of 25 to 29.9 is considered overweight. ? A BMI of 30 and above is considered obese. Watch levels of cholesterol and blood lipids  You should start having your blood tested for lipids and cholesterol at 38 years of age, then have this test every 5 years.  You may need to have your cholesterol levels checked more often if: ? Your lipid or  cholesterol levels are high. ? You are older than 38 years of age. ? You are at high risk for heart disease. Cancer screening Lung Cancer  Lung cancer screening is recommended for adults 55-80 years old who are at high risk for lung cancer because of a history of smoking.  A yearly low-dose CT scan of the lungs is recommended for people who: ? Currently smoke. ? Have quit within the past 15 years. ? Have at least a 30-pack-year history of smoking. A pack year is smoking an average of one pack of cigarettes a day for 1 year.  Yearly screening should continue until it has been 15 years since you quit.  Yearly screening should stop if you develop a health problem that would prevent you from having lung cancer treatment. Breast Cancer  Practice breast self-awareness. This means understanding how your breasts normally appear and feel.  It also means doing regular breast self-exams. Let your health care provider know about any changes, no matter how small.  If you are in your 20s or 30s, you should have a clinical breast exam (CBE) by a health care provider every 1-3 years as part of a regular health exam.  If you are 40 or older, have a CBE every year. Also consider having a breast X-ray (mammogram) every year.  If you have a family history of breast cancer, talk to your health care provider about genetic screening.  If you are at high risk for breast cancer, talk   to your health care provider about having an MRI and a mammogram every year.  Breast cancer gene (BRCA) assessment is recommended for women who have family members with BRCA-related cancers. BRCA-related cancers include: ? Breast. ? Ovarian. ? Tubal. ? Peritoneal cancers.  Results of the assessment will determine the need for genetic counseling and BRCA1 and BRCA2 testing. Cervical Cancer Your health care provider may recommend that you be screened regularly for cancer of the pelvic organs (ovaries, uterus, and vagina).  This screening involves a pelvic examination, including checking for microscopic changes to the surface of your cervix (Pap test). You may be encouraged to have this screening done every 3 years, beginning at age 21.  For women ages 30-65, health care providers may recommend pelvic exams and Pap testing every 3 years, or they may recommend the Pap and pelvic exam, combined with testing for human papilloma virus (HPV), every 5 years. Some types of HPV increase your risk of cervical cancer. Testing for HPV may also be done on women of any age with unclear Pap test results.  Other health care providers may not recommend any screening for nonpregnant women who are considered low risk for pelvic cancer and who do not have symptoms. Ask your health care provider if a screening pelvic exam is right for you.  If you have had past treatment for cervical cancer or a condition that could lead to cancer, you need Pap tests and screening for cancer for at least 20 years after your treatment. If Pap tests have been discontinued, your risk factors (such as having a new sexual partner) need to be reassessed to determine if screening should resume. Some women have medical problems that increase the chance of getting cervical cancer. In these cases, your health care provider may recommend more frequent screening and Pap tests. Colorectal Cancer  This type of cancer can be detected and often prevented.  Routine colorectal cancer screening usually begins at 38 years of age and continues through 38 years of age.  Your health care provider may recommend screening at an earlier age if you have risk factors for colon cancer.  Your health care provider may also recommend using home test kits to check for hidden blood in the stool.  A small camera at the end of a tube can be used to examine your colon directly (sigmoidoscopy or colonoscopy). This is done to check for the earliest forms of colorectal cancer.  Routine  screening usually begins at age 50.  Direct examination of the colon should be repeated every 5-10 years through 38 years of age. However, you may need to be screened more often if early forms of precancerous polyps or small growths are found. Skin Cancer  Check your skin from head to toe regularly.  Tell your health care provider about any new moles or changes in moles, especially if there is a change in a mole's shape or color.  Also tell your health care provider if you have a mole that is larger than the size of a pencil eraser.  Always use sunscreen. Apply sunscreen liberally and repeatedly throughout the day.  Protect yourself by wearing long sleeves, pants, a wide-brimmed hat, and sunglasses whenever you are outside. Heart disease, diabetes, and high blood pressure  High blood pressure causes heart disease and increases the risk of stroke. High blood pressure is more likely to develop in: ? People who have blood pressure in the high end of the normal range (130-139/85-89 mm Hg). ? People   who are overweight or obese. ? People who are African American.  If you are 84-22 years of age, have your blood pressure checked every 3-5 years. If you are 67 years of age or older, have your blood pressure checked every year. You should have your blood pressure measured twice-once when you are at a hospital or clinic, and once when you are not at a hospital or clinic. Record the average of the two measurements. To check your blood pressure when you are not at a hospital or clinic, you can use: ? An automated blood pressure machine at a pharmacy. ? A home blood pressure monitor.  If you are between 52 years and 3 years old, ask your health care provider if you should take aspirin to prevent strokes.  Have regular diabetes screenings. This involves taking a blood sample to check your fasting blood sugar level. ? If you are at a normal weight and have a low risk for diabetes, have this test once  every three years after 38 years of age. ? If you are overweight and have a high risk for diabetes, consider being tested at a younger age or more often. Preventing infection Hepatitis B  If you have a higher risk for hepatitis B, you should be screened for this virus. You are considered at high risk for hepatitis B if: ? You were born in a country where hepatitis B is common. Ask your health care provider which countries are considered high risk. ? Your parents were born in a high-risk country, and you have not been immunized against hepatitis B (hepatitis B vaccine). ? You have HIV or AIDS. ? You use needles to inject street drugs. ? You live with someone who has hepatitis B. ? You have had sex with someone who has hepatitis B. ? You get hemodialysis treatment. ? You take certain medicines for conditions, including cancer, organ transplantation, and autoimmune conditions. Hepatitis C  Blood testing is recommended for: ? Everyone born from [birth year] through 1965. ? Anyone with known risk factors for hepatitis C. Sexually transmitted infections (STIs)  You should be screened for sexually transmitted infections (STIs) including gonorrhea and chlamydia if: ? You are sexually active and are younger than 38 years of age. ? You are older than 38 years of age and your health care provider tells you that you are at risk for this type of infection. ? Your sexual activity has changed since you were last screened and you are at an increased risk for chlamydia or gonorrhea. Ask your health care provider if you are at risk.  If you do not have HIV, but are at risk, it may be recommended that you take a prescription medicine daily to prevent HIV infection. This is called pre-exposure prophylaxis (PrEP). You are considered at risk if: ? You are sexually active and do not regularly use condoms or know the HIV status of your partner(s). ? You take drugs by injection. ? You are sexually active with a partner  who has HIV. Talk with your health care provider about whether you are at high risk of being infected with HIV. If you choose to begin PrEP, you should first be tested for HIV. You should then be tested every 3 months for as long as you are taking PrEP. Pregnancy  If you are premenopausal and you may become pregnant, ask your health care provider about preconception counseling.  If you may become pregnant, take 400 to 800 micrograms (mcg) of folic acid every  day.  If you want to prevent pregnancy, talk to your health care provider about birth control (contraception). Osteoporosis and menopause  Osteoporosis is a disease in which the bones lose minerals and strength with aging. This can result in serious bone fractures. Your risk for osteoporosis can be identified using a bone density scan.  If you are 65 years of age or older, or if you are at risk for osteoporosis and fractures, ask your health care provider if you should be screened.  Ask your health care provider whether you should take a calcium or vitamin D supplement to lower your risk for osteoporosis.  Menopause may have certain physical symptoms and risks.  Hormone replacement therapy may reduce some of these symptoms and risks. Talk to your health care provider about whether hormone replacement therapy is right for you. Follow these instructions at home:  Schedule regular health, dental, and eye exams.  Stay current with your immunizations.  Do not use any tobacco products including cigarettes, chewing tobacco, or electronic cigarettes.  If you are pregnant, do not drink alcohol.  If you are breastfeeding, limit how much and how often you drink alcohol.  Limit alcohol intake to no more than 1 drink per day for nonpregnant women. One drink equals 12 ounces of beer, 5 ounces of wine, or 1 ounces of hard liquor.  Do not use street drugs.  Do not share needles.  Ask your health care provider for help if you need support  or information about quitting drugs.  Tell your health care provider if you often feel depressed.  Tell your health care provider if you have ever been abused or do not feel safe at home. This information is not intended to replace advice given to you by your health care provider. Make sure you discuss any questions you have with your health care provider. Document Released: 08/25/2010 Document Revised: 07/18/2015 Document Reviewed: 11/13/2014 Elsevier Interactive Patient Education  2019 [employer] Medroxyprogesterone injection [Contraceptive] What is this medicine? MEDROXYPROGESTERONE (me DROX ee proe JES te rone) contraceptive injections prevent pregnancy. They provide effective birth control for 3 months. Depo-subQ Provera 104 is also used for treating pain related to endometriosis. This medicine may be used for other purposes; ask your health care provider or pharmacist if you have questions. COMMON BRAND NAME(S): Depo-Provera, Depo-subQ Provera 104 What should I tell my health care provider before I take this medicine? They need to know if you have any of these conditions: -frequently drink alcohol -asthma -blood vessel disease or a history of a blood clot in the lungs or legs -bone disease such as osteoporosis -breast cancer -diabetes -eating disorder (anorexia nervosa or bulimia) -high blood pressure -HIV infection or AIDS -kidney disease -liver disease -mental depression -migraine -seizures (convulsions) -stroke -tobacco smoker -vaginal bleeding -an unusual or allergic reaction to medroxyprogesterone, other hormones, medicines, foods, dyes, or preservatives -pregnant or trying to get pregnant -breast-feeding How should I use this medicine? Depo-Provera Contraceptive injection is given into a muscle. Depo-subQ Provera 104 injection is given under the skin. These injections are given by a health care professional. You must not be pregnant before getting an injection. The  injection is usually given during the first 5 days after the start of a menstrual period or 6 weeks after delivery of a baby. Talk to your pediatrician regarding the use of this medicine in children. Special care may be needed. These injections have been used in female children who have started having menstrual periods. Overdosage: If   you think you have taken too much of this medicine contact a poison control center or emergency room at once. NOTE: This medicine is only for you. Do not share this medicine with others. What if I miss a dose? Try not to miss a dose. You must get an injection once every 3 months to maintain birth control. If you cannot keep an appointment, call and reschedule it. If you wait longer than 13 weeks between Depo-Provera contraceptive injections or longer than 14 weeks between Depo-subQ Provera 104 injections, you could get pregnant. Use another method for birth control if you miss your appointment. You may also need a pregnancy test before receiving another injection. What may interact with this medicine? Do not take this medicine with any of the following medications: -bosentan This medicine may also interact with the following medications: -aminoglutethimide -antibiotics or medicines for infections, especially rifampin, rifabutin, rifapentine, and griseofulvin -aprepitant -barbiturate medicines such as phenobarbital or primidone -bexarotene -carbamazepine -medicines for seizures like ethotoin, felbamate, oxcarbazepine, phenytoin, topiramate -modafinil -St. John's wort This list may not describe all possible interactions. Give your health care provider a list of all the medicines, herbs, non-prescription drugs, or dietary supplements you use. Also tell them if you smoke, drink alcohol, or use illegal drugs. Some items may interact with your medicine. What should I watch for while using this medicine? This drug does not protect you against HIV infection (AIDS) or other  sexually transmitted diseases. Use of this product may cause you to lose calcium from your bones. Loss of calcium may cause weak bones (osteoporosis). Only use this product for more than 2 years if other forms of birth control are not right for you. The longer you use this product for birth control the more likely you will be at risk for weak bones. Ask your health care professional how you can keep strong bones. You may have a change in bleeding pattern or irregular periods. Many females stop having periods while taking this drug. If you have received your injections on time, your chance of being pregnant is very low. If you think you may be pregnant, see your health care professional as soon as possible. Tell your health care professional if you want to get pregnant within the next year. The effect of this medicine may last a long time after you get your last injection. What side effects may I notice from receiving this medicine? Side effects that you should report to your doctor or health care professional as soon as possible: -allergic reactions like skin rash, itching or hives, swelling of the face, lips, or tongue -breast tenderness or discharge -breathing problems -changes in vision -depression -feeling faint or lightheaded, falls -fever -pain in the abdomen, chest, groin, or leg -problems with balance, talking, walking -unusually weak or tired -yellowing of the eyes or skin Side effects that usually do not require medical attention (report to your doctor or health care professional if they continue or are bothersome): -acne -fluid retention and swelling -headache -irregular periods, spotting, or absent periods -temporary pain, itching, or skin reaction at site where injected -weight gain This list may not describe all possible side effects. Call your doctor for medical advice about side effects. You may report side effects to FDA at 1-800-FDA-1088. Where should I keep my  medicine? This does not apply. The injection will be given to you by a health care professional. NOTE: This sheet is a summary. It may not cover all possible information. If you have questions about   this medicine, talk to your doctor, pharmacist, or health care provider.  2019 Elsevier/Gold Standard (2008-03-02 18:37:56)  

## 2018-05-24 NOTE — Progress Notes (Signed)
Subjective:     Catherine Cannon is a 38 y.o. female who presents for a postpartum visit. She is 5 weeks postpartum following a spontaneous vaginal delivery. I have fully reviewed the prenatal and intrapartum course. The delivery was at 72 gestational weeks. Outcome: spontaneous vaginal delivery. Anesthesia: epidural. Postpartum course has been normal. Baby's course has been normal. Baby is feeding by breast. Bleeding thin lochia. Bowel function is normal. Bladder function is normal. Patient is not sexually active. Contraception method is abstinence. Postpartum depression screening: negative.  The following portions of the patient's history were reviewed and updated as appropriate: allergies, current medications, past family history, past medical history, past social history, past surgical history and problem list.  Review of Systems Pertinent items are noted in HPI.   Objective:    BP 117/71   Pulse 71   Temp 97.6 F (36.4 C)   Wt 202 lb 8 oz (91.9 kg)   BMI 29.90 kg/m   General:  alert and cooperative  Lungs: normal effort  Abdomen: soft, non-tender; bowel sounds normal; no masses,  no organomegaly   Vulva:  pt declined pelvic exam        Assessment:     Normal postpartum exam. Pap smear not done at today's visit.   Plan:   1. Encounter for routine postpartum follow-up -doing well  2. Encounter for initial prescription of injectable contraceptive  - medroxyPROGESTERone (DEPO-PROVERA) injection 150 mg   Jorje Guild, NP

## 2018-05-25 LAB — POCT PREGNANCY, URINE
PREG TEST UR: NEGATIVE
Preg Test, Ur: NEGATIVE

## 2018-06-07 ENCOUNTER — Telehealth: Payer: Self-pay | Admitting: *Deleted

## 2018-06-07 NOTE — Telephone Encounter (Signed)
Pt left VM message stating that she needs to get FMLA papers completed by Friday of this week and needs to know the process. Encounter forwarded to Kara Dies who will contact pt with the necessary information.

## 2018-08-22 ENCOUNTER — Telehealth: Payer: Self-pay | Admitting: Family Medicine

## 2018-08-22 NOTE — Telephone Encounter (Signed)
Attempted to call patient about her appointment on 6/30 @ 3:00. No answer and the voicemail was not setup.

## 2018-08-23 ENCOUNTER — Ambulatory Visit: Payer: Medicaid Other

## 2020-01-12 IMAGING — DX DG CHEST 2V
2 series · 2 of 2 positions shown · non-contrast
Comparison: 03/16/2016 and prior chest radiographs

CLINICAL DATA: Acute chest pain and shortness of breath for 1 week.

EXAM:
CHEST - 2 VIEW

[chest pa]
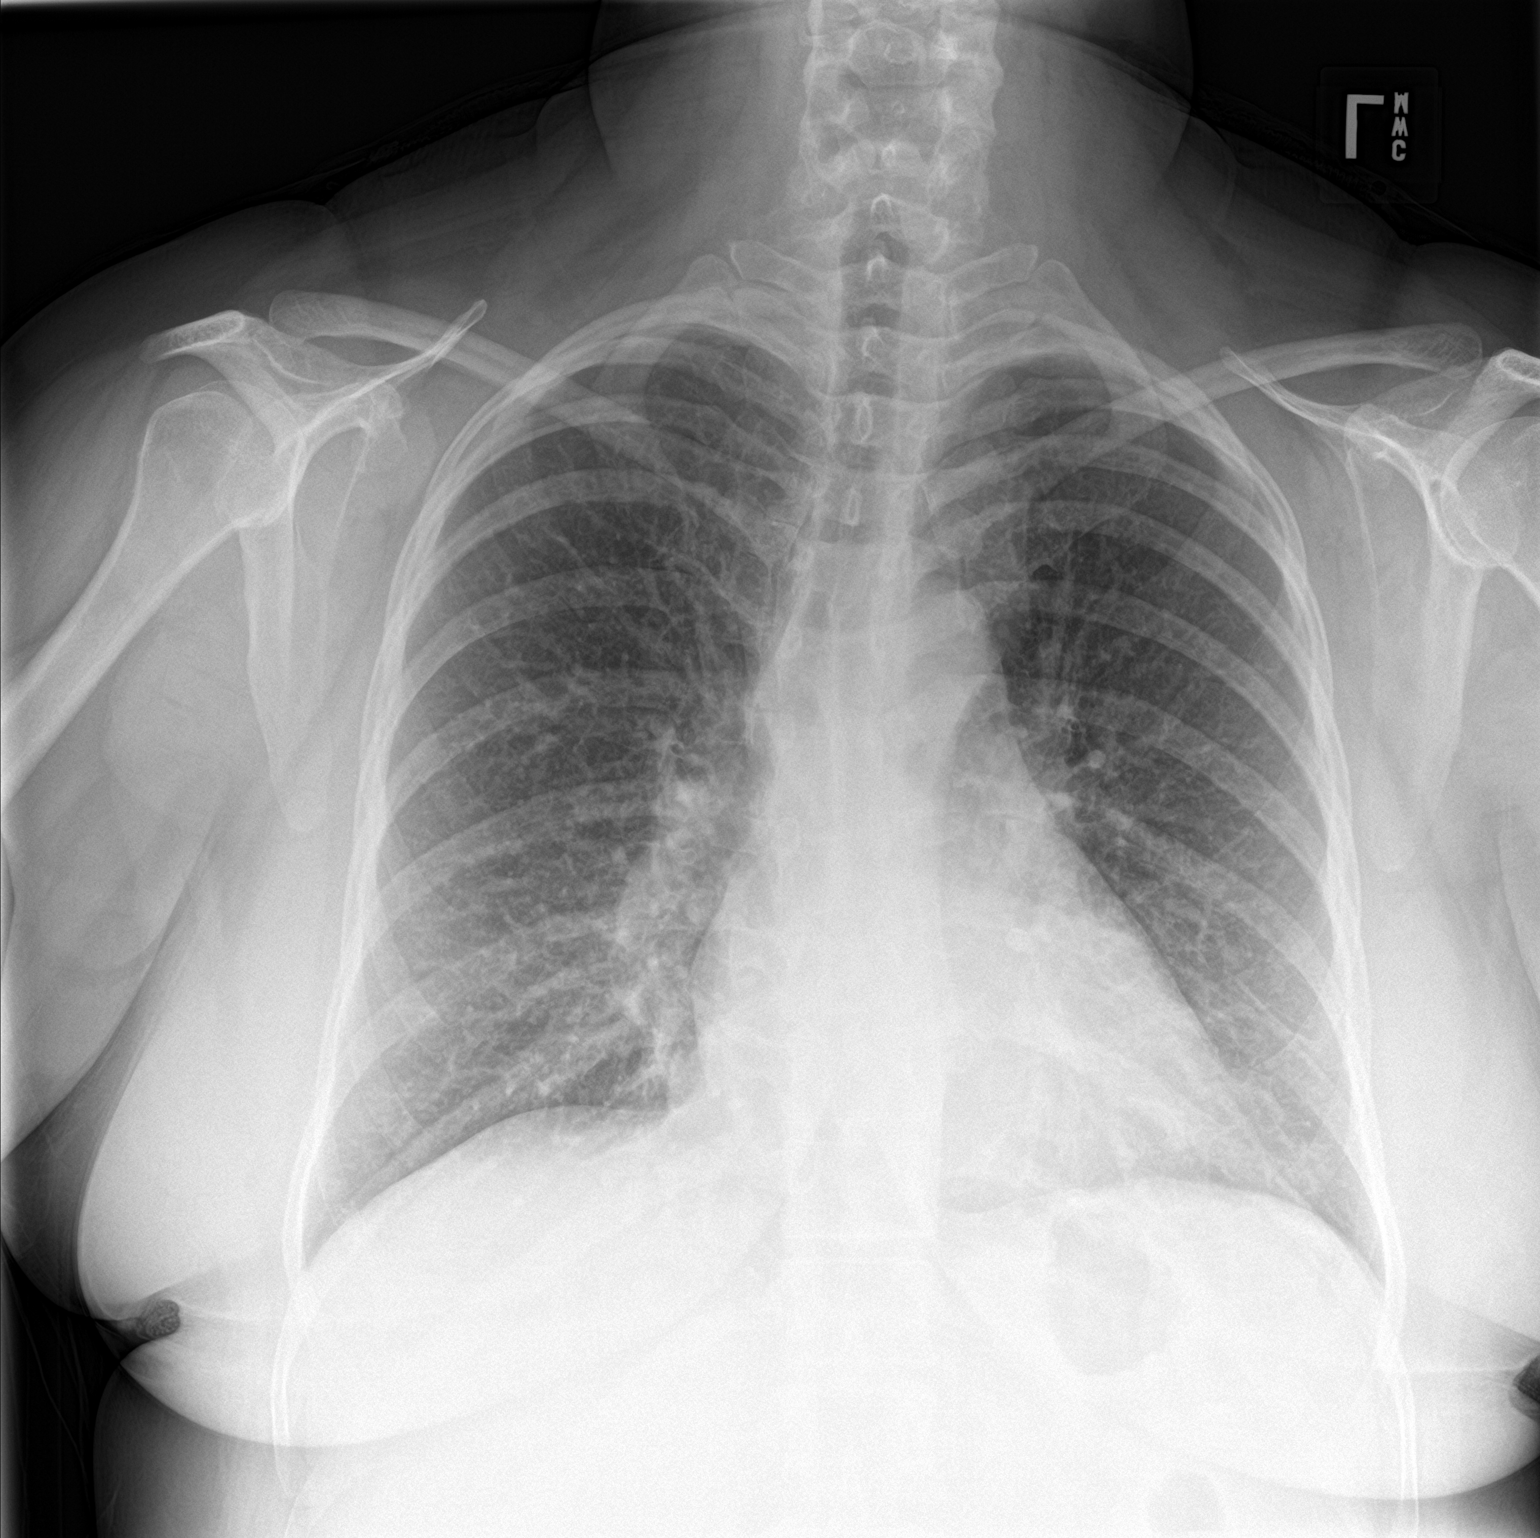

[chest lat]
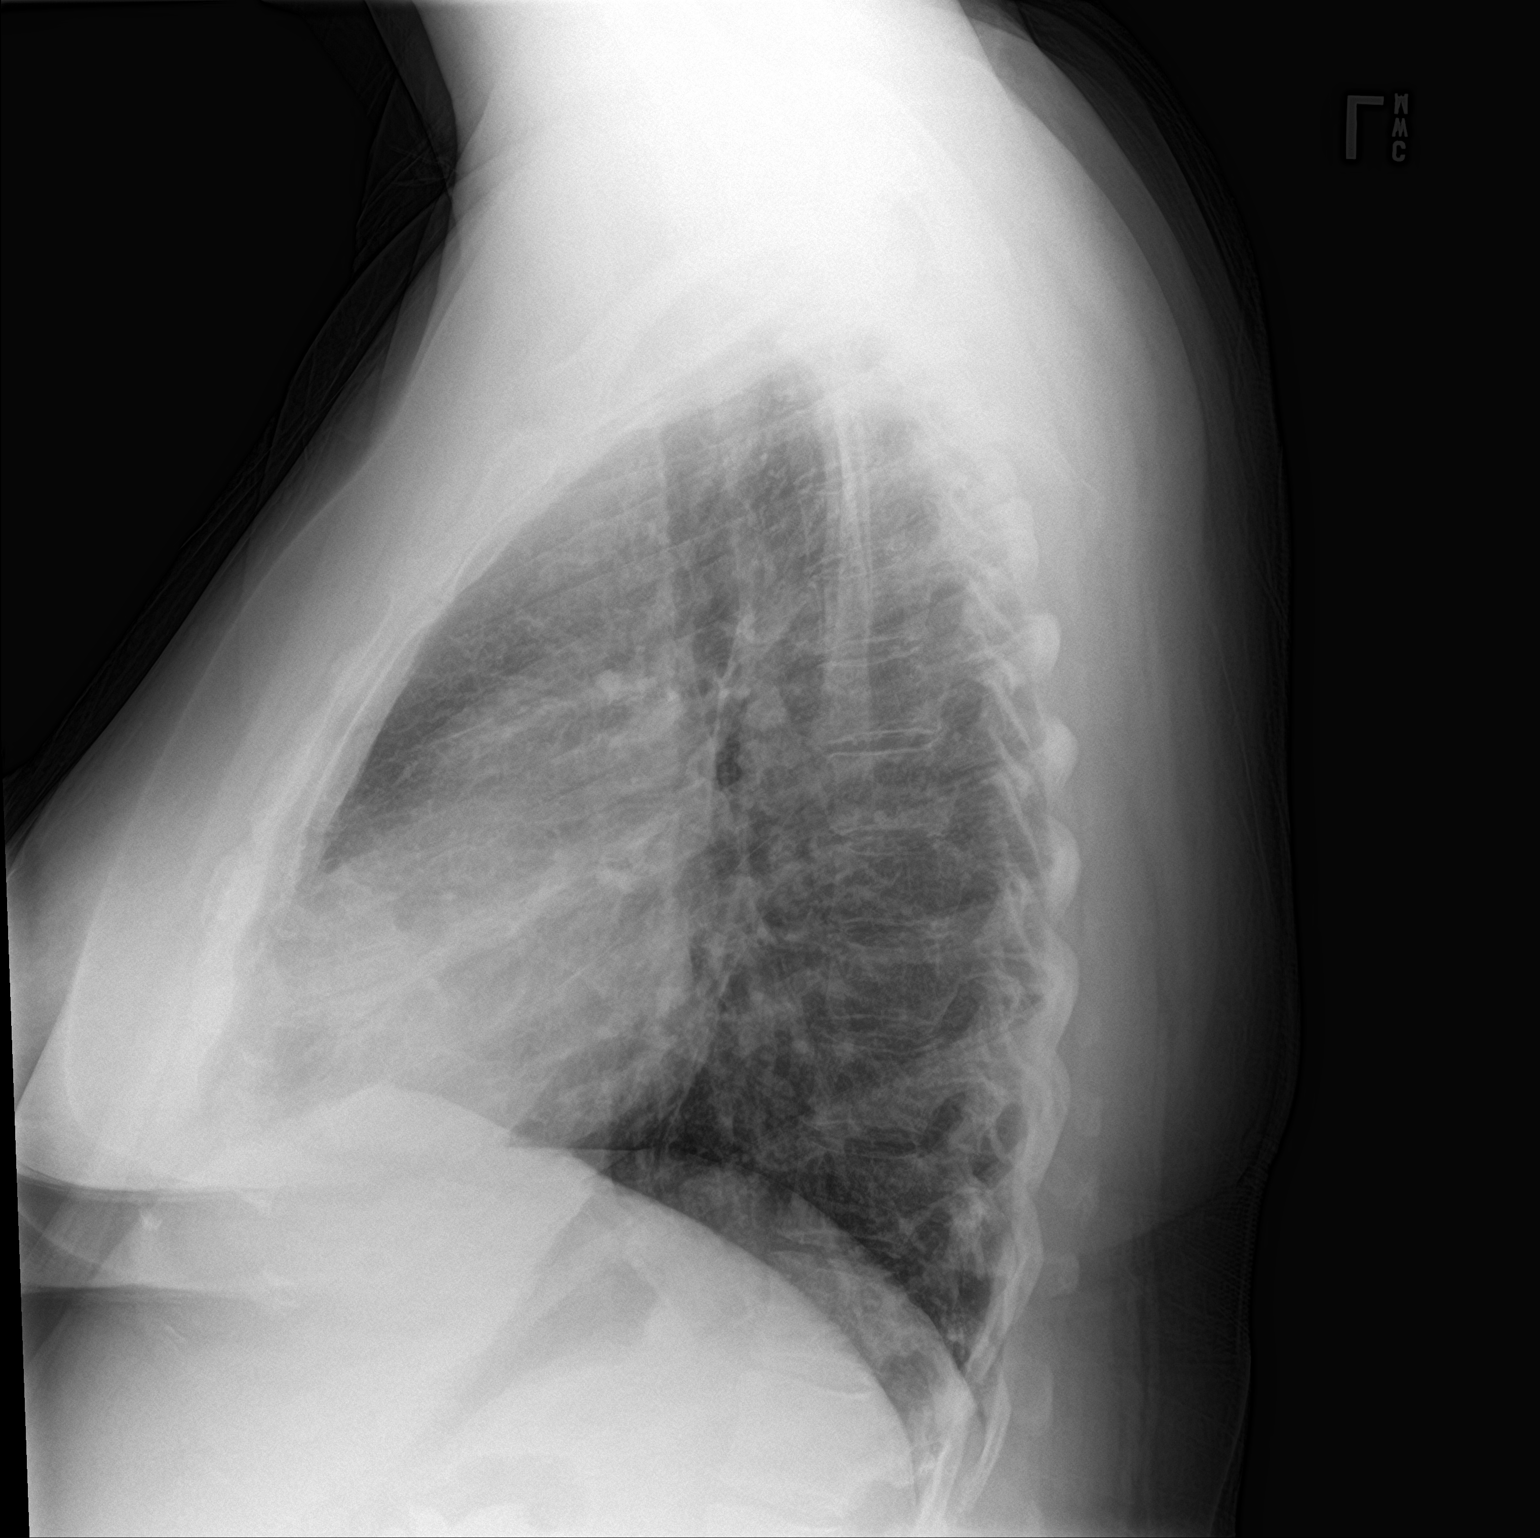

[2 of 2 positions shown; findings below may reference images not displayed]

FINDINGS: The cardiomediastinal silhouette is unremarkable.

Chronic peribronchial thickening again noted.

There is no evidence of focal airspace disease, pulmonary edema,
suspicious pulmonary nodule/mass, pleural effusion, or pneumothorax.

No acute bony abnormalities are identified.
IMPRESSION: No evidence of acute cardiopulmonary disease

Chronic peribronchial thickening.

## 2020-04-04 ENCOUNTER — Other Ambulatory Visit: Payer: Self-pay

## 2020-04-04 ENCOUNTER — Encounter: Payer: Self-pay | Admitting: Emergency Medicine

## 2020-04-04 ENCOUNTER — Ambulatory Visit (INDEPENDENT_AMBULATORY_CARE_PROVIDER_SITE_OTHER): Payer: Medicaid Other

## 2020-04-04 ENCOUNTER — Ambulatory Visit
Admission: EM | Admit: 2020-04-04 | Discharge: 2020-04-04 | Disposition: A | Discharge status: Home / Self Care | Payer: Medicaid Other | Attending: Family Medicine | Admitting: Family Medicine

## 2020-04-04 DIAGNOSIS — R509 Fever, unspecified: Secondary | ICD-10-CM

## 2020-04-04 DIAGNOSIS — J22 Unspecified acute lower respiratory infection: Secondary | ICD-10-CM | POA: Diagnosis not present

## 2020-04-04 DIAGNOSIS — R059 Cough, unspecified: Secondary | ICD-10-CM | POA: Diagnosis not present

## 2020-04-04 DIAGNOSIS — Z20822 Contact with and (suspected) exposure to covid-19: Secondary | ICD-10-CM

## 2020-04-04 DIAGNOSIS — R0602 Shortness of breath: Secondary | ICD-10-CM | POA: Diagnosis not present

## 2020-04-04 MED ORDER — AZITHROMYCIN 250 MG PO TABS: ORAL_TABLET | ORAL | 0 refills | Status: DC

## 2020-04-04 MED ORDER — IPRATROPIUM-ALBUTEROL 0.5-2.5 (3) MG/3ML IN SOLN: 3.0000 mL | Freq: Four times a day (QID) | RESPIRATORY_TRACT | 1 refills | Status: DC | PRN

## 2020-04-04 MED ORDER — CEFDINIR 300 MG PO CAPS: 300.0000 mg | ORAL_CAPSULE | Freq: Two times a day (BID) | ORAL | 0 refills | Status: AC

## 2020-04-04 MED ORDER — ALBUTEROL SULFATE HFA 108 (90 BASE) MCG/ACT IN AERS
2.0000 | INHALATION_SPRAY | Freq: Once | RESPIRATORY_TRACT | Status: AC
  Administered 2020-04-04: 2 via RESPIRATORY_TRACT

## 2020-04-04 MED ORDER — ACETAMINOPHEN 325 MG PO TABS
650.0000 mg | ORAL_TABLET | Freq: Once | ORAL | Status: AC
  Administered 2020-04-04: 650 mg via ORAL

## 2020-04-04 MED ORDER — BENZONATATE 200 MG PO CAPS: 200.0000 mg | ORAL_CAPSULE | Freq: Three times a day (TID) | ORAL | 0 refills | Status: DC | PRN

## 2020-04-04 NOTE — ED Provider Notes (Signed)
[facility]    CSN: [id number] Arrival date & time: 04/04/20  1424      History   Chief Complaint Chief Complaint  Patient presents with  . Generalized Body Aches  . Headache  . Chills    HPI Catherine Cannon is a 40 y.o. female.   HPI Patient presents today for evaluation of generalized body aches, headaches, chills. Patient is febrile on arrival with an oxygen level 93%. She is mildly tachycardic. Symptoms have been present for a total of 2 days. Patient also endorses that she has had some tooth pain on the right side of her mouth. She is a current daily smoker. Has a history of acute respiratory failure requiring intubation along with multifocal pneumonia, sepsis.  She is unvaccinated against Covid.  Reports no prior COVID-19 related infections. She is a daily smoker. Past Medical History:  Diagnosis Date  . Acute respiratory failure with hypoxia ([place]) 10/16/2017  . ARDS (adult respiratory distress syndrome) ([place]) 10/19/2017  . Multifocal pneumonia 10/16/2017  . Sepsis ([place]) 10/16/2017    Patient Active Problem List   Diagnosis Date Noted  . History of MRSA infection 03/14/2018  . Obesity 12/22/2017  . Tobacco use 12/22/2017  . 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 Trichomonal vaginitis during pregnancy in second trimester 11/15/2017    Past Surgical History:  Procedure Laterality Date  . NO PAST SURGERIES      OB History    Gravida  1   Para  1   Term  1   Preterm      AB      Living  1     SAB      IAB      Ectopic      Multiple  0   Live Births  1            Home Medications    Prior to Admission medications   Medication Sig Start Date End Date Taking? Authorizing Provider  acetaminophen (TYLENOL) 500 MG tablet Take 500 mg by mouth every 4 (four) hours as needed for mild pain.    [provider]  diclofenac (VOLTAREN) 75 MG EC tablet Take 1 tablet (75 mg total) by mouth 2 (two) times daily  with a meal. Patient not taking: Reported on 05/24/2018 05/13/18   Donnamae Jude, MD  ibuprofen (ADVIL,MOTRIN) 600 MG tablet Take 1 tablet (600 mg total) by mouth every 6 (six) hours. 04/21/18   Nuala Alpha, DO  Nicotine (NICODERM CQ TD) Place 1 patch onto the skin daily.     [provider]  penicillin v potassium (VEETID) 500 MG tablet Take 1 tablet (500 mg total) by mouth 4 (four) times daily. Patient not taking: Reported on 04/04/2020 05/13/18   Donnamae Jude, MD  Prenatal Vit-Fe Fumarate-FA (PRENATAL MULTIVITAMIN) TABS tablet Take 1 tablet by mouth at bedtime.    [provider]  senna-docusate (SENOKOT-S) 8.6-50 MG tablet Take 2 tablets by mouth daily. 04/22/18   Nuala Alpha, DO    Family History Family History  Problem Relation Age of Onset  . Asthma Mother     Social History Social History   Tobacco Use  . Smoking status: Current Every Day Smoker    Packs/day: 0.50    Types: Cigarettes  . Smokeless tobacco: Never Used  Vaping Use  . Vaping Use: Never used  Substance Use Topics  . Alcohol use: Not Currently    Comment: occ   .  Drug use: Not Currently    Types: Cocaine    Comment: years ago     Allergies   Patient has no known allergies.   Review of Systems Review of Systems Pertinent negatives listed in HPI   Physical Exam Triage Vital Signs ED Triage Vitals  Enc Vitals Group     BP 04/04/20 1445 (!) 153/82     Pulse Rate 04/04/20 1445 (!) 120     Resp 04/04/20 1445 18     Temp 04/04/20 1445 (!) 101.5 F (38.6 C)     Temp Source 04/04/20 1445 Oral     SpO2 04/04/20 1445 93 %     Weight --      Height --      Head Circumference --      Peak Flow --      Pain Score 04/04/20 1438 5     Pain Loc --      Pain Edu? --      Excl. in [place]? --    No data found.  Updated Vital Signs BP (!) 153/82 (BP Location: Right Arm)   Pulse (!) 120   Temp (!) 101.5 F (38.6 C) (Oral)   Resp 18   SpO2 93%   Visual Acuity Right Eye  Distance:   Left Eye Distance:   Bilateral Distance:    Right Eye Near:   Left Eye Near:    Bilateral Near:     Physical Exam Constitutional:      Appearance: She is obese. She is ill-appearing.  HENT:     Head: Normocephalic.     Nose: Nose normal.  Eyes:     Extraocular Movements: Extraocular movements intact.  Cardiovascular:     Rate and Rhythm: Regular rhythm. Tachycardia present.  Pulmonary:     Effort: Prolonged expiration present.     Breath sounds: Decreased air movement present. Decreased breath sounds and rales present.  Skin:    General: Skin is warm and moist.     Capillary Refill: Capillary refill takes less than 2 seconds.     Comments: He has a medical resident caregivers consultants: Dominant hands  Psychiatric:        Attention and Perception: Attention normal.        Speech: Speech normal.        Behavior: Behavior normal.     UC Treatments / Results  Labs (all labs ordered are listed, but only abnormal results are displayed) Labs Reviewed  NOVEL CORONAVIRUS, NAA    EKG   Radiology DG Chest 2 View  Result Date: 04/04/2020 CLINICAL DATA:  Cough and fever for 1 day EXAM: CHEST - 2 VIEW COMPARISON:  10/19/2017 FINDINGS: Cardiac shadow is within normal limits. The lungs are well aerated bilaterally. Mild increased interstitial markings are noted without focal confluent infiltrate. Changes may represent mild bronchitis. No sizable effusion is seen. No bony abnormality is noted. IMPRESSION: Changes suggestive of mild bronchitis. Electronically Signed   By: Inez Catalina M.D.   On: 04/04/2020 14:57    Procedures Procedures (including critical care time)  Medications Ordered in UC Medications  acetaminophen (TYLENOL) tablet 650 mg (has no administration in time range)    Initial Impression / Assessment and Plan / UC Course  I have reviewed the triage vital signs and the nursing notes.  Pertinent labs & imaging results that were available during my  care of the patient were reviewed by me and considered in my medical decision making (see chart for  details).    Patient presents today with 2 days of fever, generalized body weakness and shortness of breath.  High suspicion of COVID-19 viral infection.  Chest x-ray abnormal show symptoms interstitial streaking concern for possible early forming viral pneumonia therefore will cover with azithromycin and Omnicef.  Patient is breast-feeding therefore opted for azithromycin and Omnicef both are safe during breast-feeding.  Dispense nebulizer machine here from clinic patient will administer duo nebs every 6 hours for shortness of breath and chest tightness.  She was also given an albuterol inhaler to use if needed for shortness of breath and wheezing every 2 puffs every 4-6 hours. Highly encourage patient to purchase a pulse oximetry reader given her history of acute respiratory failure requiring intubation related to seizures and pneumonia. Patient advised if oxygen level was persistently less than 90% ,go immediately to the ER she is very high risk for development of acute respiratory failure.  COVID-19 test is pending and should result within 3 to 5 days.  Strict ER precautions given patient verbalized understanding and agreement with plan. Final Clinical Impressions(s) / UC Diagnoses   Final diagnoses:  Encounter for screening laboratory testing for COVID-19 virus  Fever, unspecified  Lower respiratory infection  SOB (shortness of breath)     Discharge Instructions     Continue to monitor your oxygen level at home if your oxygen levels are persistently less than 90% after you have performed nebulizer treatments or giving yourself 2 puffs of your albuterol inhaler this is indication that you are in acute respiratory distress and warrant immediate ER evaluation.  You will perform your nebulizer treatments every 6 hours for shortness of breath or chest tightness. You may also use your albuterol  inhaler 2 puffs every 4 hours as needed.  I have prescribed you both azithromycin and Omnicef for management of your pneumonia.    Continue ibuprofen and Tylenol for fever management.    ED Prescriptions    Medication Sig Dispense Auth. Provider   ipratropium-albuterol (DUONEB) 0.5-2.5 (3) MG/3ML SOLN Take 3 mLs by nebulization every 6 (six) hours as needed (shortness of breath and wheezing). 360 mL Scot Jun, FNP   cefdinir (OMNICEF) 300 MG capsule Take 1 capsule (300 mg total) by mouth 2 (two) times daily for 10 days. 20 capsule Scot Jun, FNP   benzonatate (TESSALON) 200 MG capsule Take 1 capsule (200 mg total) by mouth 3 (three) times daily as needed for cough. 360 capsule Scot Jun, FNP   azithromycin (ZITHROMAX) 250 MG tablet Take 2 tabs PO x 1 dose, then 1 tab PO QD x 4 days 6 tablet Scot Jun, FNP     PDMP not reviewed this encounter.   Scot Jun, FNP 04/04/20 [place]

## 2020-04-04 NOTE — Discharge Instructions (Addendum)
Continue to monitor your oxygen level at home if your oxygen levels are persistently less than 90% after you have performed nebulizer treatments or giving yourself 2 puffs of your albuterol inhaler this is indication that you are in acute respiratory distress and warrant immediate ER evaluation.  You will perform your nebulizer treatments every 6 hours for shortness of breath or chest tightness. You may also use your albuterol inhaler 2 puffs every 4 hours as needed.  I have prescribed you both azithromycin and Omnicef for management of your pneumonia.    Continue ibuprofen and Tylenol for fever management.

## 2020-04-04 NOTE — ED Triage Notes (Signed)
Pt here for HA and body aches with fever; pt was intubated 2 years prior for unknown reason; pt sts sx started 2 days ago; pt noted to be febrile at present; pt sts right sided tooth pain

## 2020-04-05 LAB — SARS-COV-2, NAA 2 DAY TAT

## 2020-04-05 LAB — NOVEL CORONAVIRUS, NAA: SARS-CoV-2, NAA: DETECTED — AB

## 2020-04-06 ENCOUNTER — Telehealth: Payer: Self-pay | Admitting: Unknown Physician Specialty

## 2020-04-06 NOTE — Telephone Encounter (Signed)
Called to discuss with patient about COVID-19 symptoms and the use of one of the available treatments for those with mild to moderate Covid symptoms and at a high risk of hospitalization.  Pt appears to qualify for outpatient treatment due to co-morbid conditions and/or a member of an at-risk group in accordance with the FDA Emergency Use Authorization.    Symptom onset: 2/08 Vaccinated: no Booster? no Immunocompromised? no Qualifiers: required intubation in the past  Unable to reach pt - mailbox is full.  No mychart  Kathrine Haddock

## 2020-06-11 IMAGING — US US MFM OB FOLLOW-UP
1 series · 14 of 28 positions shown · non-contrast
Comparison: none

[Series 1: us mfm ob follow-up · 51 acquisitions, 14 frames shown]
[im 2/51]
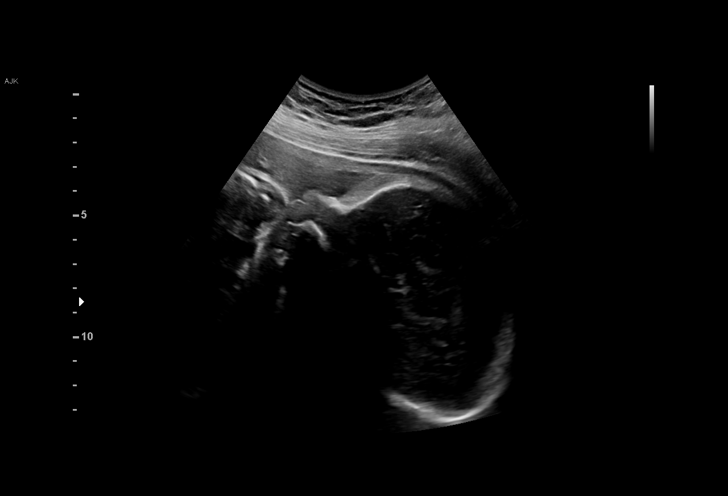
[im 6/51]
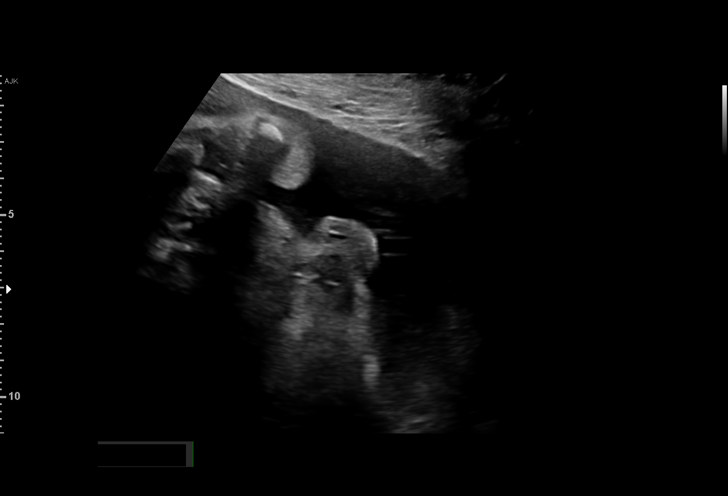
[im 10/51]
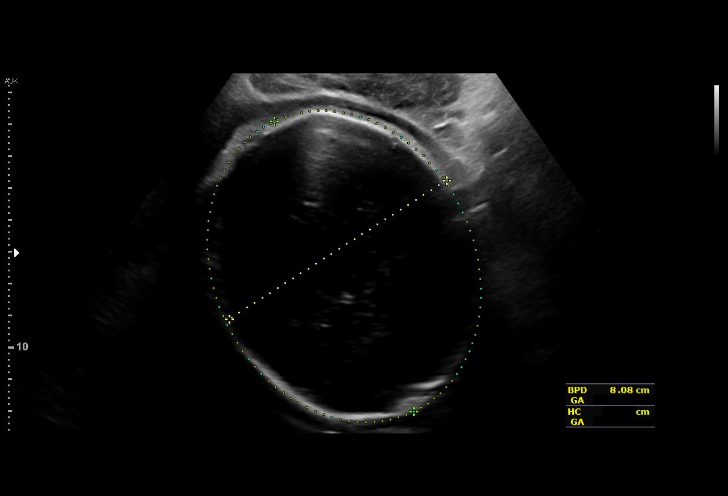
[im 13/51]
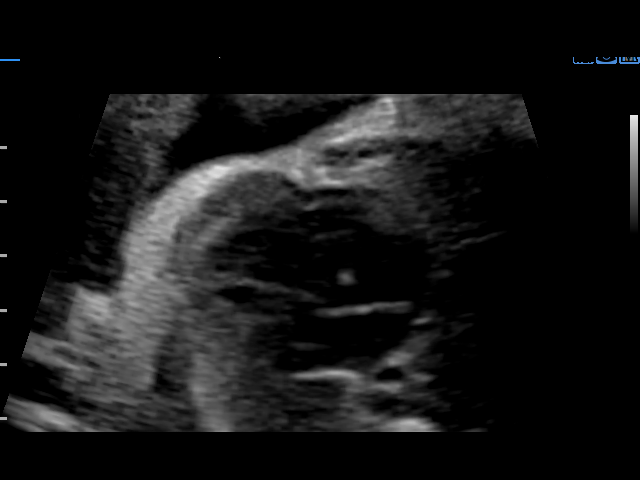
[im 17/51]
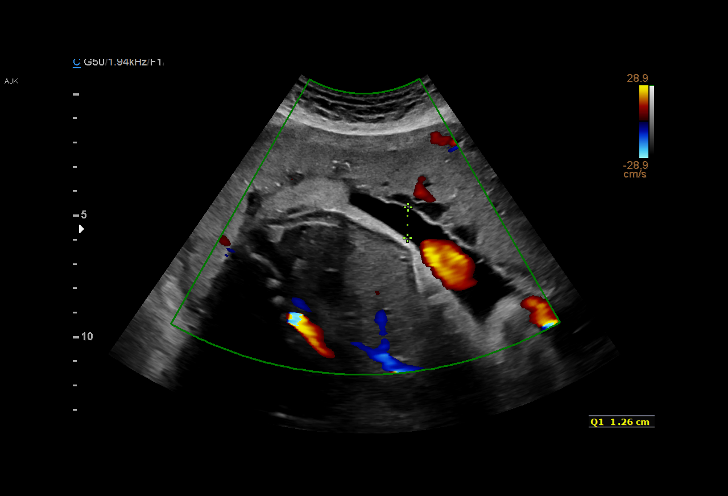
[im 21/51]
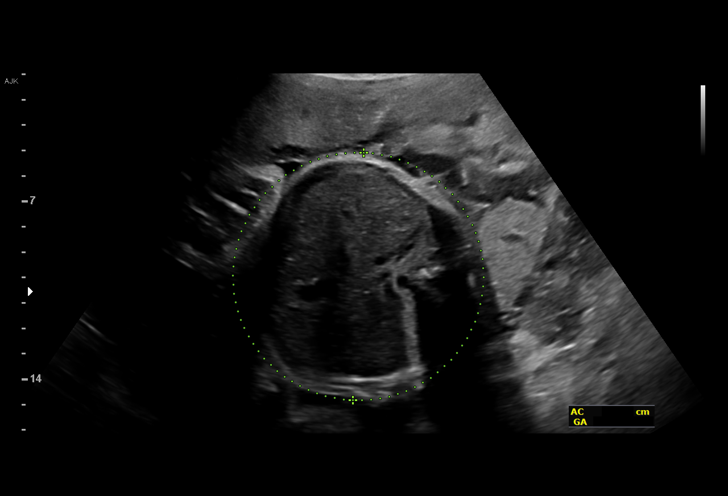
[im 25/51]
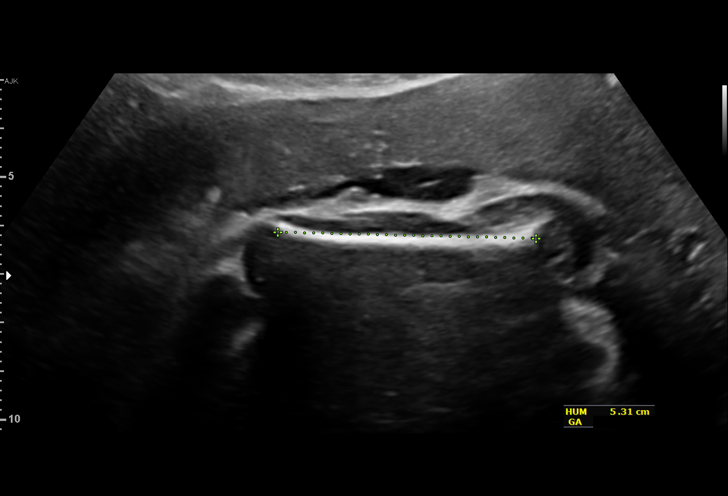
[im 28/51]
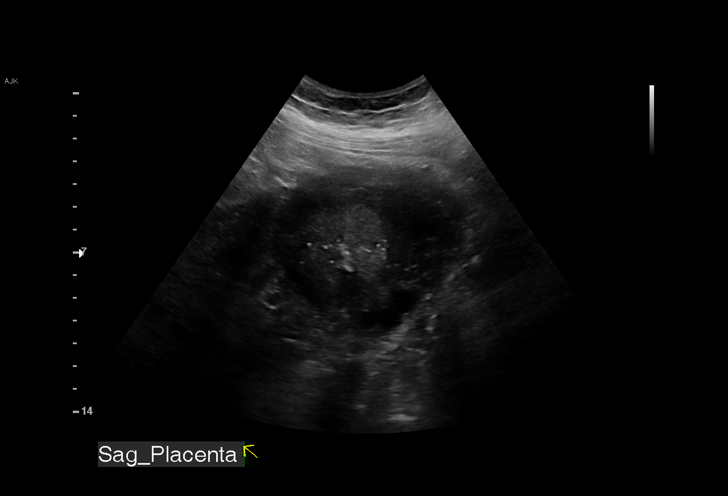
[im 32/51]
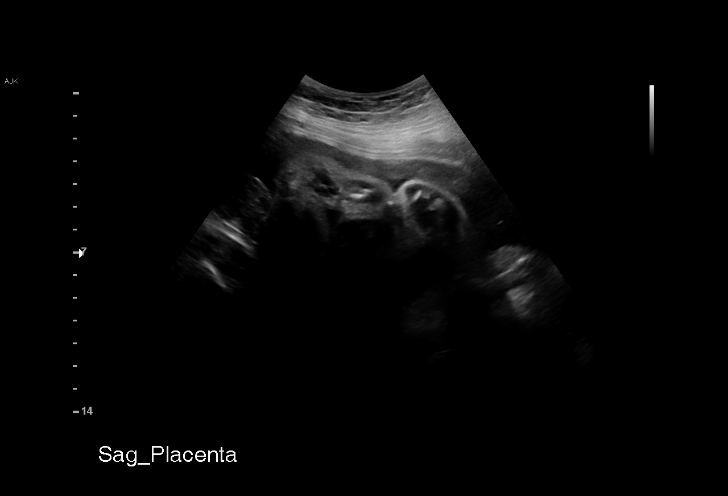
[im 36/51]
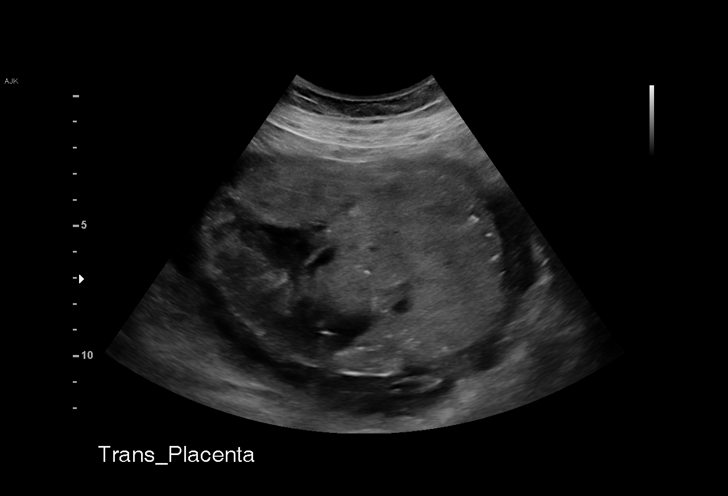
[im 39/51]
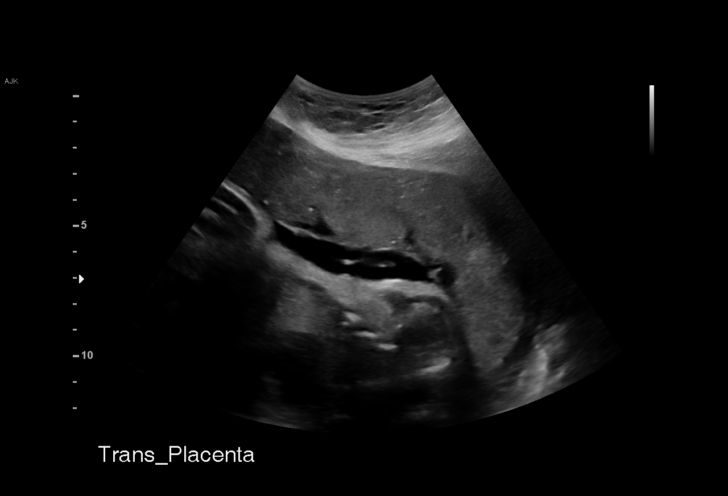
[im 43/51]
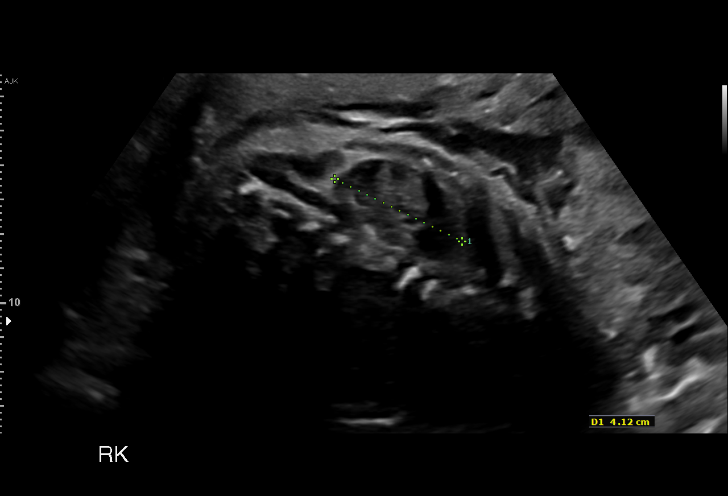
[im 47/51]
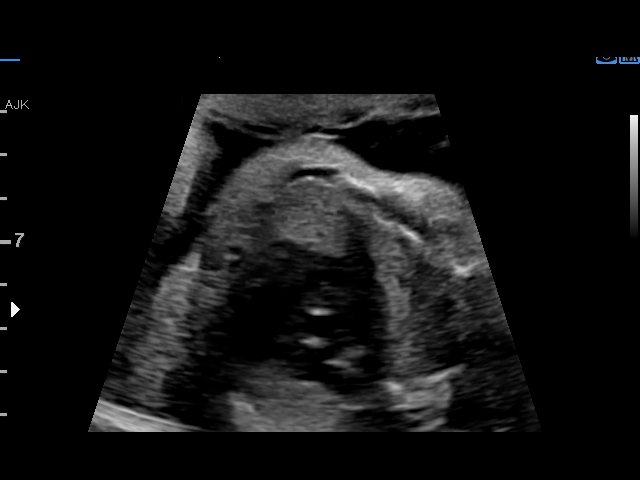
[im 51/51]
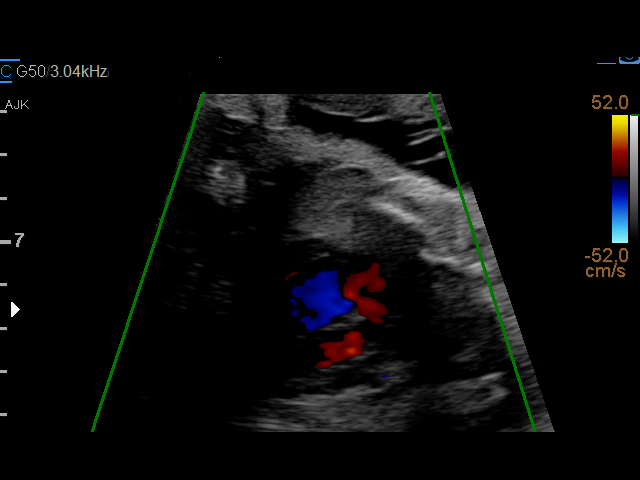

[14 of 28 positions shown; findings below may reference images not displayed]

----------------------------------------------------------------------

 ----------------------------------------------------------------------
Indications

  Obesity complicating pregnancy, third
  trimester
  Smoking complicating pregnancy, third
  trimester
  Advanced maternal age primigravida 35+,
  third trimester (low risk NIPs)
  Medical complication of pregnancy (sepsis
  Alcohol use complicating pregnancy, third
  trimester
  33 weeks gestation of pregnancy
 ----------------------------------------------------------------------
Vital Signs

                                                Height:        5'9"
Fetal Evaluation

 Num Of Fetuses:          1
 Fetal Heart              141
 Rate(bpm):
 Cardiac Activity:        Observed
 Presentation:            Cephalic
 Placenta:                Anterior
 P. Cord Insertion:       Visualized, central

 Amniotic Fluid
 AFI FV:      Subjectively low-normal

 AFI Sum(cm)     %Tile       Largest Pocket(cm)
 8.42            6
 RUQ(cm)       RLQ(cm)       LUQ(cm)        LLQ(cm)

Biometry

 BPD:      81.9  mm     G. Age:  33w 0d         41  %    CI:         79.27  %    70 - 86
                                                         FL/HC:       20.6  %    19.9 -
 HC:      290.8  mm     G. Age:  32w 0d          4  %    HC/AC:       0.95       0.96 -
 AC:      307.7  mm     G. Age:  34w 5d         91  %    FL/BPD:      73.1  %    71 - 87
 FL:       59.9  mm     G. Age:  31w 1d          6  %    FL/AC:       19.5  %    20 - 24
 HUM:      53.1  mm     G. Age:  31w 0d         17  %

 Est. FW:    4424   g    4 lb 12 oz      64  %
                    m
OB History

 Gravidity:    1
Gestational Age

 LMP:           31w 6d        Date:  07/14/17                 EDD:    04/20/18
 U/S Today:     32w 5d                                        EDD:    04/14/18
 Best:          33w 0d     Det. By:  Previous Ultrasound      EDD:    04/12/18
                                     (10/27/17)
Anatomy

 Cranium:               Appears normal         Aortic Arch:            Previously seen
 Cavum:                 Previously seen        Ductal Arch:            Not well visualized
 Ventricles:            Previously seen        Diaphragm:              Previously seen
 Choroid Plexus:        Previously seen        Stomach:                Appears normal,
                                                                       left sided
 Cerebellum:            Previously seen        Abdomen:                Appears normal
 Posterior Fossa:       Appears normal         Abdominal Wall:         Previously seen
 Nuchal Fold:           Not applicable (>20    Cord Vessels:           Previously seen
                        wks GA)
 Face:                  Orbits and profile     Kidneys:                Appear normal
                        previously seen
 Lips:                  Appears normal         Bladder:                Appears normal
 Thoracic:              Appears normal         Spine:                  Previously seen
 Heart:                 Appears normal         Upper Extremities:      RT Previously seen
                        (4CH, axis, and
                        situs)
 RVOT:                  Previously seen        Lower Extremities:      Previously seen
 LVOT:                  Appears normal

 Other:  Fetus appears to be a male. Heels previously visualized. Nasal bone
         visualized. Technicallly difficult due to advanced GA and maternal
         habitus.
Impression

 Normal interval growth.
Recommendations

 Follow up growth in 4 weeks.

## 2020-08-06 ENCOUNTER — Encounter (HOSPITAL_BASED_OUTPATIENT_CLINIC_OR_DEPARTMENT_OTHER): Payer: Self-pay

## 2020-08-06 ENCOUNTER — Emergency Department (HOSPITAL_BASED_OUTPATIENT_CLINIC_OR_DEPARTMENT_OTHER): Payer: Medicaid Other

## 2020-08-06 ENCOUNTER — Other Ambulatory Visit: Payer: Self-pay

## 2020-08-06 ENCOUNTER — Emergency Department (HOSPITAL_BASED_OUTPATIENT_CLINIC_OR_DEPARTMENT_OTHER)
Admission: EM | Admit: 2020-08-06 | Discharge: 2020-08-06 | Disposition: A | Discharge status: Home / Self Care | Payer: Medicaid Other | Attending: Emergency Medicine | Admitting: Emergency Medicine

## 2020-08-06 DIAGNOSIS — M79605 Pain in left leg: Secondary | ICD-10-CM

## 2020-08-06 DIAGNOSIS — R252 Cramp and spasm: Secondary | ICD-10-CM | POA: Diagnosis not present

## 2020-08-06 DIAGNOSIS — F17210 Nicotine dependence, cigarettes, uncomplicated: Secondary | ICD-10-CM | POA: Insufficient documentation

## 2020-08-06 MED ORDER — CYCLOBENZAPRINE HCL 5 MG PO TABS: 5.0000 mg | ORAL_TABLET | Freq: Three times a day (TID) | ORAL | 0 refills | Status: DC | PRN

## 2020-08-06 NOTE — ED Triage Notes (Signed)
Pt c/o intermittent leg cramps x 2 months. Pt also notice a bump on L calf and is concerned at a DVT.

## 2020-08-06 NOTE — ED Provider Notes (Signed)
[facility] Provider Note   CSN: [id number] Arrival date & time: 08/06/20  1904     History Chief Complaint  Patient presents with   Leg Pain    Catherine Cannon is a 40 y.o. female.  HPI     40yo female with history of multifocal pneumonia/ARDS in 2019 presents with concern for left leg cramping. Reports intermittent symptoms over months but had cramping of left calf today when has been constant and not improving. Tried ibuprofen without relief.  Typically cramps will get better but this has been constant. No chest pain, no fever, no injuries.  Has not had diarrhea, n/v/not on diuretics.  Is breastfeeding 40yo.   Past Medical History:  Diagnosis Date   Acute respiratory failure with hypoxia ([place]) 10/16/2017   ARDS (adult respiratory distress syndrome) ([place]) 10/19/2017   Multifocal pneumonia 10/16/2017   Sepsis ([place]) 10/16/2017    Patient Active Problem List   Diagnosis Date Noted   History of MRSA infection 03/14/2018   Obesity 12/22/2017   Tobacco use 12/22/2017   Intramural uterine fibroid - Retroplacental  12/22/2017   Trichomonal vaginitis during pregnancy in second trimester 11/15/2017    Past Surgical History:  Procedure Laterality Date   NO PAST SURGERIES       OB History     Gravida  1   Para  1   Term  1   Preterm      AB      Living  1      SAB      IAB      Ectopic      Multiple  0   Live Births  1           Family History  Problem Relation Age of Onset   Asthma Mother     Social History   Tobacco Use   Smoking status: Every Day    Packs/day: 0.50    Pack years: 0.00    Types: Cigarettes   Smokeless tobacco: Never  Vaping Use   Vaping Use: Never used  Substance Use Topics   Alcohol use: Yes    Comment: occ    Drug use: Not Currently    Types: Cocaine    Comment: years ago    Home Medications Prior to Admission medications   Medication Sig Start Date End Date Taking? Authorizing  Provider  cyclobenzaprine (FLEXERIL) 5 MG tablet Take 1 tablet (5 mg total) by mouth 3 (three) times daily as needed for muscle spasms. 08/06/20  Yes Gareth Morgan, MD  acetaminophen (TYLENOL) 500 MG tablet Take 500 mg by mouth every 4 (four) hours as needed for mild pain.    [provider]  azithromycin (ZITHROMAX) 250 MG tablet Take 2 tabs PO x 1 dose, then 1 tab PO QD x 4 days 04/04/20   Scot Jun, FNP  benzonatate (TESSALON) 200 MG capsule Take 1 capsule (200 mg total) by mouth 3 (three) times daily as needed for cough. 04/04/20   Scot Jun, FNP  diclofenac (VOLTAREN) 75 MG EC tablet Take 1 tablet (75 mg total) by mouth 2 (two) times daily with a meal. Patient not taking: Reported on 05/24/2018 05/13/18   Donnamae Jude, MD  ibuprofen (ADVIL,MOTRIN) 600 MG tablet Take 1 tablet (600 mg total) by mouth every 6 (six) hours. 04/21/18   Lockamy, Christia Reading, DO  ipratropium-albuterol (DUONEB) 0.5-2.5 (3) MG/3ML SOLN Take 3 mLs by nebulization every 6 (six) hours as needed (shortness  of breath and wheezing). 04/04/20   Scot Jun, FNP  Nicotine (NICODERM CQ TD) Place 1 patch onto the skin daily.     [provider]  Prenatal Vit-Fe Fumarate-FA (PRENATAL MULTIVITAMIN) TABS tablet Take 1 tablet by mouth at bedtime.    [provider]  senna-docusate (SENOKOT-S) 8.6-50 MG tablet Take 2 tablets by mouth daily. 04/22/18   Nuala Alpha, DO    Allergies    Patient has no known allergies.  Review of Systems   Review of Systems  Constitutional:  Negative for fever.  Gastrointestinal:  Negative for diarrhea, nausea and vomiting.  Musculoskeletal:  Positive for myalgias.  Skin:  Negative for rash.   Physical Exam Updated Vital Signs BP 122/90 (BP Location: Right Arm)   Pulse 70   Temp 98.2 F (36.8 C) (Oral)   Resp 18   Ht 5' 8.5" (1.74 m)   Wt 102 kg   LMP 07/30/2020   SpO2 96%   BMI 33.71 kg/m   Physical Exam Vitals and nursing note  reviewed.  Constitutional:      General: She is not in acute distress.    Appearance: Normal appearance. She is not ill-appearing, toxic-appearing or diaphoretic.  HENT:     Head: Normocephalic.  Eyes:     Conjunctiva/sclera: Conjunctivae normal.  Cardiovascular:     Rate and Rhythm: Normal rate and regular rhythm.     Pulses: Normal pulses.  Pulmonary:     Effort: Pulmonary effort is normal. No respiratory distress.  Musculoskeletal:        General: Tenderness (left calf) present. No deformity or signs of injury.     Cervical back: No rigidity.  Skin:    General: Skin is warm and dry.     Coloration: Skin is not jaundiced or pale.  Neurological:     General: No focal deficit present.     Mental Status: She is alert and oriented to person, place, and time.    ED Results / Procedures / Treatments   Labs (all labs ordered are listed, but only abnormal results are displayed) Labs Reviewed - No data to display   EKG None  Radiology US Venous Img Lower Unilateral Left  Result Date: 08/06/2020 CLINICAL DATA:  Intermittent left leg cramps for 2 months. Swelling on the left calf. EXAM: Left LOWER EXTREMITY VENOUS DOPPLER ULTRASOUND TECHNIQUE: Gray-scale sonography with compression, as well as color and duplex ultrasound, were performed to evaluate the deep venous system(s) from the level of the common femoral vein through the popliteal and proximal calf veins. COMPARISON:  None. FINDINGS: VENOUS Normal compressibility of the common femoral, superficial femoral, and popliteal veins, as well as the visualized calf veins. Visualized portions of profunda femoral vein and great saphenous vein unremarkable. No filling defects to suggest DVT on grayscale or color Doppler imaging. Doppler waveforms show normal direction of venous flow, normal respiratory plasticity and response to augmentation. Limited views of the contralateral common femoral vein are unremarkable. OTHER Imaging obtained in the  area of lump on the medial left calf demonstrates a patent underlying greater saphenous vein varix. Limitations: none IMPRESSION: No evidence of deep venous thrombosis in the visualized lower extremity veins. Electronically Signed   By: Lucienne Capers M.D.   On: 08/06/2020 20:55    Procedures Procedures   Medications Ordered in ED Medications - No data to display  ED Course  I have reviewed the triage vital signs and the nursing notes.  Pertinent labs & imaging results  that were available during my care of the patient were reviewed by me and considered in my medical decision making (see chart for details).    MDM Rules/Calculators/A&P                           40yo female with history of multifocal pneumonia/ARDS in 2019 presents with concern for left leg cramping. Normal pulses, no sign of acute arterial thrombus. No sign of abscess or cellulitis. Do not suspect significant electrolyte abnormality by history-did discuss possible labs however feel in this clinically scenario and low suspicion for significant abnormalities can be completed as outpatient.  DVT study negative for DVT. Given rx for flexeril. Patient discharged in stable condition with understanding of reasons to return.    Final Clinical Impression(s) / ED Diagnoses Final diagnoses:  Calf cramp  Left leg pain    Rx / DC Orders ED Discharge Orders          Ordered    cyclobenzaprine (FLEXERIL) 5 MG tablet  3 times daily PRN        08/06/20 2114             Gareth Morgan, MD 08/07/20 1029

## 2020-11-18 ENCOUNTER — Other Ambulatory Visit: Payer: Self-pay

## 2020-11-18 ENCOUNTER — Ambulatory Visit
Admission: EM | Admit: 2020-11-18 | Discharge: 2020-11-18 | Disposition: A | Discharge status: Home / Self Care | Payer: Medicaid Other | Attending: Urgent Care | Admitting: Urgent Care

## 2020-11-18 ENCOUNTER — Ambulatory Visit (INDEPENDENT_AMBULATORY_CARE_PROVIDER_SITE_OTHER): Payer: Medicaid Other

## 2020-11-18 ENCOUNTER — Encounter: Payer: Self-pay | Admitting: Emergency Medicine

## 2020-11-18 DIAGNOSIS — K047 Periapical abscess without sinus: Secondary | ICD-10-CM | POA: Diagnosis not present

## 2020-11-18 DIAGNOSIS — F17200 Nicotine dependence, unspecified, uncomplicated: Secondary | ICD-10-CM | POA: Diagnosis not present

## 2020-11-18 DIAGNOSIS — Z8709 Personal history of other diseases of the respiratory system: Secondary | ICD-10-CM | POA: Diagnosis not present

## 2020-11-18 DIAGNOSIS — R0602 Shortness of breath: Secondary | ICD-10-CM

## 2020-11-18 DIAGNOSIS — R059 Cough, unspecified: Secondary | ICD-10-CM | POA: Diagnosis not present

## 2020-11-18 MED ORDER — AMOXICILLIN-POT CLAVULANATE 875-125 MG PO TABS: 1.0000 | ORAL_TABLET | Freq: Two times a day (BID) | ORAL | 0 refills | Status: DC

## 2020-11-18 MED ORDER — BENZONATATE 100 MG PO CAPS: 100.0000 mg | ORAL_CAPSULE | Freq: Three times a day (TID) | ORAL | 0 refills | Status: DC | PRN

## 2020-11-18 MED ORDER — PROMETHAZINE-DM 6.25-15 MG/5ML PO SYRP: 5.0000 mL | ORAL_SOLUTION | Freq: Every evening | ORAL | 0 refills | Status: DC | PRN

## 2020-11-18 MED ORDER — ALBUTEROL SULFATE HFA 108 (90 BASE) MCG/ACT IN AERS: 1.0000 | INHALATION_SPRAY | Freq: Four times a day (QID) | RESPIRATORY_TRACT | 0 refills | Status: DC | PRN

## 2020-11-18 NOTE — ED Provider Notes (Signed)
[place]   MRN: [id number] DOB: 19-May-1980  Subjective:   Catherine Cannon is a 40 y.o. female presenting for persistent coughing and malaise with fatigue for the past 2 weeks.  She is now starting to have some intermittent shortness of breath and wheezing, feels like her cough is getting worse.  Started to have dental pain both upper and lower.  States that she has a Pharmacist, community and has not reached out to them for an appointment.  Reports that she knows she needs a lot of dental work and would like an antibiotic to bridge her appointment.  She is a smoker, is working on quitting.  No current facility-administered medications for this encounter.  Current Outpatient Medications:    acetaminophen (TYLENOL) 500 MG tablet, Take 500 mg by mouth every 4 (four) hours as needed for mild pain., Disp: , Rfl:    azithromycin (ZITHROMAX) 250 MG tablet, Take 2 tabs PO x 1 dose, then 1 tab PO QD x 4 days, Disp: 6 tablet, Rfl: 0   benzonatate (TESSALON) 200 MG capsule, Take 1 capsule (200 mg total) by mouth 3 (three) times daily as needed for cough., Disp: 360 capsule, Rfl: 0   cyclobenzaprine (FLEXERIL) 5 MG tablet, Take 1 tablet (5 mg total) by mouth 3 (three) times daily as needed for muscle spasms., Disp: 30 tablet, Rfl: 0   diclofenac (VOLTAREN) 75 MG EC tablet, Take 1 tablet (75 mg total) by mouth 2 (two) times daily with a meal. (Patient not taking: Reported on 05/24/2018), Disp: 30 tablet, Rfl: 0   ibuprofen (ADVIL,MOTRIN) 600 MG tablet, Take 1 tablet (600 mg total) by mouth every 6 (six) hours., Disp: 30 tablet, Rfl: 0   ipratropium-albuterol (DUONEB) 0.5-2.5 (3) MG/3ML SOLN, Take 3 mLs by nebulization every 6 (six) hours as needed (shortness of breath and wheezing)., Disp: 360 mL, Rfl: 1   Nicotine (NICODERM CQ TD), Place 1 patch onto the skin daily. , Disp: , Rfl:    Prenatal Vit-Fe Fumarate-FA (PRENATAL MULTIVITAMIN) TABS tablet, Take 1 tablet by mouth at bedtime., Disp: , Rfl:     senna-docusate (SENOKOT-S) 8.6-50 MG tablet, Take 2 tablets by mouth daily., Disp: 20 tablet, Rfl: 0   No Known Allergies  Past Medical History:  Diagnosis Date   Acute respiratory failure with hypoxia ([place]) 10/16/2017   ARDS (adult respiratory distress syndrome) ([place]) 10/19/2017   Multifocal pneumonia 10/16/2017   Sepsis ([place]) 10/16/2017     Past Surgical History:  Procedure Laterality Date   NO PAST SURGERIES      Family History  Problem Relation Age of Onset   Asthma Mother     Social History   Tobacco Use   Smoking status: Every Day    Packs/day: 0.50    Types: Cigarettes   Smokeless tobacco: Never  Vaping Use   Vaping Use: Never used  Substance Use Topics   Alcohol use: Yes    Comment: occ    Drug use: Not Currently    Types: Cocaine    Comment: years ago    ROS   Objective:   Vitals: BP (!) 155/104 (BP Location: Right Arm)   Pulse 75   Temp 97.9 F (36.6 C) (Oral)   Resp 18   SpO2 95%   Physical Exam Constitutional:      General: She is not in acute distress.    Appearance: Normal appearance. She is well-developed. She is not ill-appearing, toxic-appearing or diaphoretic.  HENT:     Head: Normocephalic  and atraumatic.     Right Ear: External ear normal.     Left Ear: External ear normal.     Nose: Nose normal.     Mouth/Throat:     Mouth: Mucous membranes are moist.     Pharynx: No oropharyngeal exudate or posterior oropharyngeal erythema.     Comments: Poor dentition throughout with multiple dental caries, erythematous gums.  The worst is over the molars both upper and lower with multiple defects on either side.  Airway is patent, patient is controlling secretions. Eyes:     General: No scleral icterus.       Right eye: No discharge.        Left eye: No discharge.     Extraocular Movements: Extraocular movements intact.     Conjunctiva/sclera: Conjunctivae normal.     Pupils: Pupils are equal, round, and reactive to light.  Cardiovascular:      Rate and Rhythm: Normal rate and regular rhythm.     Pulses: Normal pulses.     Heart sounds: Normal heart sounds. No murmur heard.   No friction rub. No gallop.  Pulmonary:     Effort: Pulmonary effort is normal. No respiratory distress.     Breath sounds: No stridor. Rhonchi present. No wheezing or rales.  Skin:    General: Skin is warm and dry.     Findings: No rash.  Neurological:     Mental Status: She is alert and oriented to person, place, and time.  Psychiatric:        Mood and Affect: Mood normal.        Behavior: Behavior normal.        Thought Content: Thought content normal.    DG Chest 2 View  Result Date: 11/18/2020 CLINICAL DATA:  Cough and shortness of breath. EXAM: CHEST - 2 VIEW COMPARISON:  Chest x-ray dated April 04, 2020. FINDINGS: The heart size and mediastinal contours are within normal limits. Both lungs are clear. The visualized skeletal structures are unremarkable. IMPRESSION: No active cardiopulmonary disease. Electronically Signed   By: Titus Dubin M.D.   On: 11/18/2020 11:21     Assessment and Plan :   PDMP not reviewed this encounter.  1. Dental infection   2. Cough   3. History of respiratory failure   4. Smoker     Start Augmentin for dental infection/abscess, use naproxen for pain and inflammation. Emphasized need for dental surgeon consult.  Given timeline of her illness deferred COVID testing.  Chest x-ray was negative.  She is going to be undergoing an antibiotic course with Augmentin for dental infection which will cover for respiratory infections anyway.  Recommend supportive care otherwise, continue efforts at quitting smoking.  Use albuterol inhaler as needed.  Counseled patient on potential for adverse effects with medications prescribed/recommended today, strict ER and return-to-clinic precautions discussed, patient verbalized understanding.    Jaynee Eagles, [place] 11/18/20 1157

## 2020-11-18 NOTE — ED Triage Notes (Signed)
Cold 2 weeks ago, cough developing 1 week ago, now feeling SOB with worsening cough. Has been using OTC meds to help relieve symptoms. Also complaining of broken/infected tooth with mild swelling into face/neck, requesting antibiotics until she gets in to see dentist

## 2020-12-08 ENCOUNTER — Other Ambulatory Visit: Payer: Self-pay

## 2020-12-08 ENCOUNTER — Encounter (HOSPITAL_COMMUNITY): Payer: Self-pay

## 2020-12-08 ENCOUNTER — Emergency Department (HOSPITAL_COMMUNITY)
Admission: EM | Admit: 2020-12-08 | Discharge: 2020-12-08 | Disposition: A | Discharge status: Home / Self Care | Payer: Medicaid Other | Attending: Emergency Medicine | Admitting: Emergency Medicine

## 2020-12-08 ENCOUNTER — Emergency Department (HOSPITAL_COMMUNITY): Payer: Medicaid Other

## 2020-12-08 DIAGNOSIS — Z20822 Contact with and (suspected) exposure to covid-19: Secondary | ICD-10-CM | POA: Diagnosis not present

## 2020-12-08 DIAGNOSIS — R059 Cough, unspecified: Secondary | ICD-10-CM | POA: Diagnosis present

## 2020-12-08 DIAGNOSIS — J45909 Unspecified asthma, uncomplicated: Secondary | ICD-10-CM | POA: Diagnosis not present

## 2020-12-08 DIAGNOSIS — F17210 Nicotine dependence, cigarettes, uncomplicated: Secondary | ICD-10-CM | POA: Insufficient documentation

## 2020-12-08 DIAGNOSIS — N9489 Other specified conditions associated with female genital organs and menstrual cycle: Secondary | ICD-10-CM | POA: Diagnosis not present

## 2020-12-08 LAB — CBC WITH DIFFERENTIAL/PLATELET
Abs Immature Granulocytes: 0.04 10*3/uL (ref 0.00–0.07)
Basophils Absolute: 0.1 10*3/uL (ref 0.0–0.1)
Basophils Relative: 1 %
Eosinophils Absolute: 0.5 10*3/uL (ref 0.0–0.5)
Eosinophils Relative: 5 %
HCT: 49.7 % — ABNORMAL HIGH (ref 36.0–46.0)
Hemoglobin: 16.7 g/dL — ABNORMAL HIGH (ref 12.0–15.0)
Immature Granulocytes: 0 %
Lymphocytes Relative: 31 %
Lymphs Abs: 3.2 10*3/uL (ref 0.7–4.0)
MCH: 32.1 pg (ref 26.0–34.0)
MCHC: 33.6 g/dL (ref 30.0–36.0)
MCV: 95.6 fL (ref 80.0–100.0)
Monocytes Absolute: 0.7 10*3/uL (ref 0.1–1.0)
Monocytes Relative: 7 %
Neutro Abs: 5.7 10*3/uL (ref 1.7–7.7)
Neutrophils Relative %: 56 %
Platelets: 242 10*3/uL (ref 150–400)
RBC: 5.2 MIL/uL — ABNORMAL HIGH (ref 3.87–5.11)
RDW: 13.7 % (ref 11.5–15.5)
WBC: 10.1 10*3/uL (ref 4.0–10.5)
nRBC: 0 % (ref 0.0–0.2)

## 2020-12-08 LAB — COMPREHENSIVE METABOLIC PANEL
ALT: 55 U/L — ABNORMAL HIGH (ref 0–44)
AST: 43 U/L — ABNORMAL HIGH (ref 15–41)
Albumin: 4.2 g/dL (ref 3.5–5.0)
Alkaline Phosphatase: 76 U/L (ref 38–126)
Anion gap: 9 (ref 5–15)
BUN: 5 mg/dL — ABNORMAL LOW (ref 6–20)
CO2: 22 mmol/L (ref 22–32)
Calcium: 9.3 mg/dL (ref 8.9–10.3)
Chloride: 104 mmol/L (ref 98–111)
Creatinine, Ser: 0.58 mg/dL (ref 0.44–1.00)
GFR, Estimated: >60 mL/min (ref >60)
Glucose, Bld: 110 mg/dL — ABNORMAL HIGH (ref 70–99)
Potassium: 3.7 mmol/L (ref 3.5–5.1)
Sodium: 135 mmol/L (ref 135–145)
Total Bilirubin: 0.4 mg/dL (ref 0.3–1.2)
Total Protein: 7.4 g/dL (ref 6.5–8.1)

## 2020-12-08 LAB — I-STAT BETA HCG BLOOD, ED (MC, WL, AP ONLY): I-stat hCG, quantitative: <5 m[IU]/mL (ref <5)

## 2020-12-08 LAB — RESP PANEL BY RT-PCR (FLU A&B, COVID) ARPGX2
Influenza A by PCR: NEGATIVE
Influenza B by PCR: NEGATIVE
SARS Coronavirus 2 by RT PCR: NEGATIVE

## 2020-12-08 MED ORDER — METHYLPREDNISOLONE SODIUM SUCC 125 MG IJ SOLR
125.0000 mg | Freq: Once | INTRAMUSCULAR | Status: AC
  Administered 2020-12-08: 125 mg via INTRAVENOUS
  Filled 2020-12-08: qty 2

## 2020-12-08 MED ORDER — PREDNISONE 10 MG PO TABS: 40.0000 mg | ORAL_TABLET | Freq: Every day | ORAL | 0 refills | Status: DC

## 2020-12-08 MED ORDER — ALBUTEROL SULFATE HFA 108 (90 BASE) MCG/ACT IN AERS
2.0000 | INHALATION_SPRAY | Freq: Once | RESPIRATORY_TRACT | Status: AC
  Administered 2020-12-08: 2 via RESPIRATORY_TRACT
  Filled 2020-12-08: qty 6.7

## 2020-12-08 MED ORDER — MAGNESIUM SULFATE 2 GM/50ML IV SOLN
2.0000 g | Freq: Once | INTRAVENOUS | Status: AC
  Administered 2020-12-08: 2 g via INTRAVENOUS
  Filled 2020-12-08: qty 50

## 2020-12-08 MED ORDER — LORAZEPAM 2 MG/ML IJ SOLN
1.0000 mg | Freq: Once | INTRAMUSCULAR | Status: AC
  Administered 2020-12-08: 1 mg via INTRAVENOUS
  Filled 2020-12-08: qty 1

## 2020-12-08 MED ORDER — ALBUTEROL SULFATE (2.5 MG/3ML) 0.083% IN NEBU
5.0000 mg | INHALATION_SOLUTION | Freq: Once | RESPIRATORY_TRACT | Status: AC
  Administered 2020-12-08: 5 mg via RESPIRATORY_TRACT
  Filled 2020-12-08: qty 6

## 2020-12-08 MED ORDER — IPRATROPIUM BROMIDE 0.02 % IN SOLN
0.5000 mg | Freq: Once | RESPIRATORY_TRACT | Status: AC
  Administered 2020-12-08: 0.5 mg via RESPIRATORY_TRACT
  Filled 2020-12-08: qty 2.5

## 2020-12-08 MED ORDER — ALBUTEROL SULFATE (2.5 MG/3ML) 0.083% IN NEBU: 2.5000 mg | INHALATION_SOLUTION | Freq: Four times a day (QID) | RESPIRATORY_TRACT | 12 refills | Status: DC | PRN

## 2020-12-08 NOTE — ED Triage Notes (Signed)
Pt states dry cough x 1 month. Pt states she was seen at [facility], and given abx. Pt states she finished amoxicillin without relief. Pt states she is now having SHOB with it. Pt ambulatory, speaking in full sentences.  Pt states she has been having dizziness as well.

## 2020-12-08 NOTE — ED Provider Notes (Signed)
[facility] DEPT Provider Note   CSN: [id number] Arrival date & time: 12/08/20  1022     History Chief Complaint  Patient presents with   Cough   Shortness of Breath    Catherine Cannon is a 40 y.o. female presenting for evaluation of cough and shortness of breath.  Patient states for the past month she has not been feeling well.  She has had a persistent cough which has been treated with antibiotics and Tessalon Perles without improvement.  She reports over the past week she has had increasing shortness of breath, especially when laying flat and with exertion.  No increased chest pain with inspiration, however she does have chest tightness.  She has increased pain when coughing.  She does not have a history of asthma, however states she has been admitted and intubated due to ards/multifocal pna before.  No other medical problems, takes medications daily.  She denies fevers, nausea, vomiting, abdominal pain, urinary symptoms, normal bowel movements.  No leg pain or swelling.  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/22/2017   Trichomonal vaginitis during pregnancy in second trimester 11/15/2017    Past Surgical History:  Procedure Laterality Date   NO PAST SURGERIES       OB History     Gravida  1   Para  1   Term  1   Preterm      AB      Living  1      SAB      IAB      Ectopic      Multiple  0   Live Births  1           Family History   Problem Relation Age of Onset   Asthma Mother     Social History   Tobacco Use   Smoking status: Every Day    Packs/day: 0.50    Types: Cigarettes   Smokeless tobacco: Never  Vaping Use   Vaping Use: Never used  Substance Use Topics   Alcohol use: Yes    Comment: occ    Drug use: Not Currently    Types: Cocaine    Comment: years ago    Home Medications Prior to Admission medications   Medication Sig Start Date End Date Taking? Authorizing Provider  acetaminophen (TYLENOL) 500 MG tablet Take 500 mg by mouth every 4 (four) hours as needed for mild pain.   Yes [provider]  albuterol (PROVENTIL) (2.5 MG/3ML) 0.083% nebulizer solution Take 3 mLs (2.5 mg total) by nebulization every 6 (six) hours as needed for wheezing or shortness of breath. 12/08/20  Yes Cellie Dardis, PA-C  benzonatate (TESSALON) 100 MG capsule Take 1-2 capsules (100-200 mg total) by mouth 3 (three) times daily as needed. 11/18/20  Yes Jaynee Eagles, PA-C  cetirizine (ZYRTEC) 10 MG tablet Take 10 mg by mouth daily as needed for allergies.   Yes [provider]  ipratropium-albuterol (DUONEB) 0.5-2.5 (3) MG/3ML SOLN Take  3 mLs by nebulization every 6 (six) hours as needed (shortness of breath and wheezing). 04/04/20  Yes Scot Jun, FNP  predniSONE (DELTASONE) 10 MG tablet Take 4 tablets (40 mg total) by mouth daily for 5 days. 12/09/20 12/14/20 Yes Aileana Hodder, PA-C  Pseudoeph-Doxylamine-DM-APAP (NYQUIL D COLD/FLU PO) Take 2 capsules by mouth every 6 (six) hours as needed (cold symptoms).   Yes [provider]  Pseudoephedrine-APAP-DM (DAYQUIL MULTI-SYMPTOM COLD/FLU PO) Take 2 capsules by mouth daily as needed (cough/cold).   Yes [provider]  amoxicillin-clavulanate (AUGMENTIN) 875-125 MG tablet Take 1 tablet by mouth 2 (two) times daily. Patient not taking: Reported on 12/08/2020 11/18/20   Jaynee Eagles, PA-C  promethazine-dextromethorphan (PROMETHAZINE-DM)  6.25-15 MG/5ML syrup Take 5 mLs by mouth at bedtime as needed for cough. Patient not taking: Reported on 12/08/2020 11/18/20   Jaynee Eagles, PA-C    Allergies    Patient has no known allergies.  Review of Systems   Review of Systems  Respiratory:  Positive for cough and shortness of breath.   All other systems reviewed and are negative.  Physical Exam Updated Vital Signs BP (!) 152/76   Pulse 96   Temp 97.6 F (36.4 C) (Oral)   Resp 15   Ht 5' 8.55" (1.741 m)   Wt 101.6 kg   LMP 11/10/2020   SpO2 96%   BMI 33.51 kg/m   Physical Exam Vitals and nursing note reviewed.  Constitutional:      General: She is not in acute distress.    Appearance: Normal appearance.     Comments: nontoxic  HENT:     Head: Normocephalic and atraumatic.  Eyes:     Conjunctiva/sclera: Conjunctivae normal.     Pupils: Pupils are equal, round, and reactive to light.  Cardiovascular:     Rate and Rhythm: Normal rate and regular rhythm.     Pulses: Normal pulses.  Pulmonary:     Effort: Pulmonary effort is normal. No respiratory distress.     Breath sounds: Decreased breath sounds present. No wheezing.     Comments: Speaking in full sentences.  Diminished sounds throughout Abdominal:     General: There is no distension.     Palpations: Abdomen is soft. There is no mass.     Tenderness: There is no abdominal tenderness. There is no guarding or rebound.  Musculoskeletal:        General: Normal range of motion.     Cervical back: Normal range of motion and neck supple.     Right lower leg: No edema.     Left lower leg: No edema.  Skin:    General: Skin is warm and dry.     Capillary Refill: Capillary refill takes less than 2 seconds.  Neurological:     Mental Status: She is alert and oriented to person, place, and time.  Psychiatric:        Mood and Affect: Mood and affect normal.        Speech: Speech normal.        Behavior: Behavior normal.    ED Results / Procedures / Treatments    Labs (all labs ordered are listed, but only abnormal results are displayed) Labs Reviewed  CBC WITH DIFFERENTIAL/PLATELET - Abnormal; Notable for the following components:      Result Value   RBC 5.20 (*)    Hemoglobin 16.7 (*)    HCT 49.7 (*)    All other components within normal limits  COMPREHENSIVE METABOLIC  PANEL - Abnormal; Notable for the following components:   Glucose, Bld 110 (*)    BUN 5 (*)    AST 43 (*)    ALT 55 (*)    All other components within normal limits  RESP PANEL BY RT-PCR (FLU A&B, COVID) [id number]  I-STAT BETA HCG BLOOD, ED ([facility], [facility], [facility] ONLY)    EKG EKG Interpretation  Date/Time:  Sunday December 08 2020 10:38:03 EDT Ventricular Rate:  75 PR Interval:  130 QRS Duration: 97 QT Interval:  378 QTC Calculation: 423 R Axis:   63 Text Interpretation: Sinus rhythm Low voltage, precordial leads No acute changes No significant change since last tracing Confirmed by Varney Biles [phone]) on 12/08/2020 11:14:17 AM  Radiology DG Chest Portable 1 View  Result Date: 12/08/2020 CLINICAL DATA:  Pt complained of a worsening dry cough and SOB x 1 month. EXAM: PORTABLE CHEST 1 VIEW COMPARISON:  Chest radiograph 11/18/2020 FINDINGS: The cardiomediastinal contours are within normal limits. There is mild coarsening of the interstitium likely related to smoking history. No new focal consolidation. No pneumothorax or pleural effusion. No acute finding in the visualized skeleton. IMPRESSION: No evidence of active disease in the chest. Electronically Signed   By: Audie Pinto M.D.   On: 12/08/2020 11:21    Procedures Procedures   Medications Ordered in ED Medications  albuterol (PROVENTIL) (2.5 MG/3ML) 0.083% nebulizer solution 5 mg (5 mg Nebulization Given 12/08/20 1129)  ipratropium (ATROVENT) nebulizer solution 0.5 mg (0.5 mg Nebulization Given 12/08/20 1119)  methylPREDNISolone sodium succinate (SOLU-MEDROL) 125 mg/2 mL injection 125 mg (125 mg Intravenous Given  12/08/20 1121)  magnesium sulfate IVPB 2 g 50 mL (0 g Intravenous Stopped 12/08/20 1225)  LORazepam (ATIVAN) injection 1 mg (1 mg Intravenous Given 12/08/20 1245)  albuterol (PROVENTIL) (2.5 MG/3ML) 0.083% nebulizer solution 5 mg (5 mg Nebulization Given 12/08/20 1246)  albuterol (VENTOLIN HFA) 108 (90 Base) MCG/ACT inhaler 2 puff (2 puffs Inhalation Given 12/08/20 1521)    ED Course  I have reviewed the triage vital signs and the nursing notes.  Pertinent labs & imaging results that were available during my care of the patient were reviewed by me and considered in my medical decision making (see chart for details).    MDM Rules/Calculators/A&P                           Pt presenting for evaluation of cough and shortness of breath.  On exam, patient appears nontoxic.  Sats are reassuring.  However she does have very diminished lung sounds throughout and worsening symptoms.  Concern for asthma vs bronchitis.  Consider PE, although less likely as patient has no risk factors.  She is PERC negative.  Will treat for asthma and reassess.  Due to worsening symptoms will obtain chest x-ray, COVID/flu, basic labs.  Chest x-ray viewed and independently interpreted by me, no pneumonia, pneumothorax, effusion.  COVID and flu negative.  Labs overall reassuring.  After breathing tx, there is much more air movement, patient has increased wheezing.  She states she has slight improvement.  Sats are continue to remain reassuring.  We will give another breathing treatment and reassess.  On reassessment, patient has improved air movement and less wheezing.  Sats continue to be reassuring.  She is ambulatory without hypoxia.  She continues to have some mild shortness of breath, but improved from when she came in.  Discussed continued symptomatic management at home with prednisone and albuterol.  Encouraged prompt return to the ER with any worsening symptoms.  At this time, patient appears safe for discharge.  Return  precautions given.  Patient states she understands and agrees to plan.  Final Clinical Impression(s) / ED Diagnoses Final diagnoses:  Asthma, unspecified asthma severity, unspecified whether complicated, unspecified whether persistent    Rx / DC Orders ED Discharge Orders          Ordered    predniSONE (DELTASONE) 10 MG tablet  Daily        12/08/20 1507    albuterol (PROVENTIL) (2.5 MG/3ML) 0.083% nebulizer solution  Every 6 hours PRN        12/08/20 [place], Lara Palinkas, PA-C 12/08/20 [place], [place], MD 12/09/20 1541

## 2020-12-08 NOTE — Discharge Instructions (Addendum)
Take prednisone as prescribed.  Take the entire course, even if symptoms improve. Use albuterol every 4 hours while awake for the next 2 days.  After this, as needed for shortness of breath, chest tightness, wheezing.  You may use this as the nebulizer or the inhaler. Return to the emergency room immediately if you develop fevers, chest pain, increased difficulty breathing, any new, worsening, or concerning symptoms

## 2020-12-08 NOTE — ED Notes (Signed)
Pt O2 while ambulating was 94%. PA made aware.

## 2020-12-09 ENCOUNTER — Other Ambulatory Visit: Payer: Self-pay

## 2020-12-09 ENCOUNTER — Observation Stay (HOSPITAL_COMMUNITY)
Admission: EM | Admit: 2020-12-09 | Discharge: 2020-12-11 | Disposition: A | Discharge status: Home / Self Care | Payer: Medicaid Other | Attending: Emergency Medicine | Admitting: Emergency Medicine

## 2020-12-09 ENCOUNTER — Emergency Department (HOSPITAL_COMMUNITY): Payer: Medicaid Other

## 2020-12-09 ENCOUNTER — Encounter (HOSPITAL_COMMUNITY): Payer: Self-pay

## 2020-12-09 DIAGNOSIS — R0602 Shortness of breath: Secondary | ICD-10-CM | POA: Diagnosis present

## 2020-12-09 DIAGNOSIS — Z79899 Other long term (current) drug therapy: Secondary | ICD-10-CM | POA: Diagnosis not present

## 2020-12-09 DIAGNOSIS — R062 Wheezing: Secondary | ICD-10-CM | POA: Insufficient documentation

## 2020-12-09 DIAGNOSIS — R0902 Hypoxemia: Secondary | ICD-10-CM | POA: Insufficient documentation

## 2020-12-09 DIAGNOSIS — F17210 Nicotine dependence, cigarettes, uncomplicated: Secondary | ICD-10-CM | POA: Insufficient documentation

## 2020-12-09 DIAGNOSIS — D72829 Elevated white blood cell count, unspecified: Secondary | ICD-10-CM | POA: Diagnosis not present

## 2020-12-09 DIAGNOSIS — R0600 Dyspnea, unspecified: Secondary | ICD-10-CM | POA: Diagnosis present

## 2020-12-09 LAB — TROPONIN I (HIGH SENSITIVITY): Troponin I (High Sensitivity): 15 ng/L (ref <18)

## 2020-12-09 MED ORDER — ALBUTEROL SULFATE HFA 108 (90 BASE) MCG/ACT IN AERS
2.0000 | INHALATION_SPRAY | RESPIRATORY_TRACT | Status: DC | PRN
  Administered 2020-12-09: 2 via RESPIRATORY_TRACT
  Filled 2020-12-09: qty 6.7

## 2020-12-09 NOTE — ED Triage Notes (Signed)
Patient seen yesterday here. Feeling short of breath. Sent home with breathing treatment and steroids. History of being intubated 3 years ago. No asthma/COPD. Patient was told to come back if she felt worse.

## 2020-12-09 NOTE — ED Provider Notes (Signed)
Emergency Medicine Provider Triage Evaluation Note  Catherine Cannon , a 40 y.o. female  was evaluated in triage.  Pt complains of shortness of breath that has worsened from yesterday.  Patient was here yesterday and was given a nebulizer and discharged with steroids.  She is returning because she is feeling worse.  Review of Systems  Positive: Shortness of breath and chest pain Negative: Syncope  Physical Exam  BP (!) 135/93 (BP Location: Right Arm)   Pulse 100   Temp 98.5 F (36.9 C) (Oral)   Resp (!) 21   LMP 11/10/2020   SpO2 98%  Gen:   Awake, no distress   Resp:  Increased work of breathing MSK:   Moves extremities without difficulty  Other:    Medical Decision Making  Medically screening exam initiated at 8:58 PM.  Appropriate orders placed.  Laaibah Wartman was informed that the remainder of the evaluation will be completed by another provider, this initial triage assessment does not replace that evaluation, and the importance of remaining in the ED until their evaluation is complete.  Patient with audible wheezing.     Rhae Hammock, PA-C 12/09/20 2059    Sherwood Gambler, MD 12/11/20 [phone]

## 2020-12-10 ENCOUNTER — Observation Stay (HOSPITAL_COMMUNITY): Payer: Medicaid Other

## 2020-12-10 ENCOUNTER — Encounter (HOSPITAL_COMMUNITY): Payer: Self-pay | Admitting: Internal Medicine

## 2020-12-10 DIAGNOSIS — R0600 Dyspnea, unspecified: Secondary | ICD-10-CM | POA: Diagnosis present

## 2020-12-10 DIAGNOSIS — R0603 Acute respiratory distress: Secondary | ICD-10-CM | POA: Diagnosis not present

## 2020-12-10 LAB — CBC WITH DIFFERENTIAL/PLATELET
Abs Immature Granulocytes: 0.14 10*3/uL — ABNORMAL HIGH (ref 0.00–0.07)
Basophils Absolute: 0 10*3/uL (ref 0.0–0.1)
Basophils Relative: 0 %
Eosinophils Absolute: 0 10*3/uL (ref 0.0–0.5)
Eosinophils Relative: 0 %
HCT: 46.7 % — ABNORMAL HIGH (ref 36.0–46.0)
Hemoglobin: 15.6 g/dL — ABNORMAL HIGH (ref 12.0–15.0)
Immature Granulocytes: 1 %
Lymphocytes Relative: 11 %
Lymphs Abs: 1.7 10*3/uL (ref 0.7–4.0)
MCH: 32.1 pg (ref 26.0–34.0)
MCHC: 33.4 g/dL (ref 30.0–36.0)
MCV: 96.1 fL (ref 80.0–100.0)
Monocytes Absolute: 0.4 10*3/uL (ref 0.1–1.0)
Monocytes Relative: 3 %
Neutro Abs: 14.1 10*3/uL — ABNORMAL HIGH (ref 1.7–7.7)
Neutrophils Relative %: 85 %
Platelets: 213 10*3/uL (ref 150–400)
RBC: 4.86 MIL/uL (ref 3.87–5.11)
RDW: 14.2 % (ref 11.5–15.5)
WBC: 16.5 10*3/uL — ABNORMAL HIGH (ref 4.0–10.5)
nRBC: 0 % (ref 0.0–0.2)

## 2020-12-10 LAB — BASIC METABOLIC PANEL
Anion gap: 12 (ref 5–15)
BUN: 11 mg/dL (ref 6–20)
CO2: 22 mmol/L (ref 22–32)
Calcium: 8.9 mg/dL (ref 8.9–10.3)
Chloride: 101 mmol/L (ref 98–111)
Creatinine, Ser: 0.73 mg/dL (ref 0.44–1.00)
GFR, Estimated: >60 mL/min (ref >60)
Glucose, Bld: 134 mg/dL — ABNORMAL HIGH (ref 70–99)
Potassium: 3.4 mmol/L — ABNORMAL LOW (ref 3.5–5.1)
Sodium: 135 mmol/L (ref 135–145)

## 2020-12-10 LAB — BRAIN NATRIURETIC PEPTIDE: B Natriuretic Peptide: 29.8 pg/mL (ref 0.0–100.0)

## 2020-12-10 MED ORDER — METHYLPREDNISOLONE SODIUM SUCC 125 MG IJ SOLR: 125.0000 mg | Freq: Once | INTRAMUSCULAR | Status: DC

## 2020-12-10 MED ORDER — HYDROXYZINE HCL 25 MG PO TABS
25.0000 mg | ORAL_TABLET | Freq: Three times a day (TID) | ORAL | Status: DC | PRN
  Administered 2020-12-10 – 2020-12-11 (×2): 25 mg via ORAL
  Filled 2020-12-10 (×2): qty 1

## 2020-12-10 MED ORDER — METHYLPREDNISOLONE SODIUM SUCC 125 MG IJ SOLR
60.0000 mg | Freq: Two times a day (BID) | INTRAMUSCULAR | Status: AC
  Administered 2020-12-10: 60 mg via INTRAVENOUS
  Filled 2020-12-10: qty 2

## 2020-12-10 MED ORDER — LORAZEPAM 0.5 MG PO TABS
0.5000 mg | ORAL_TABLET | Freq: Once | ORAL | Status: AC
  Administered 2020-12-10: 0.5 mg via ORAL
  Filled 2020-12-10: qty 1

## 2020-12-10 MED ORDER — IPRATROPIUM BROMIDE 0.02 % IN SOLN
0.5000 mg | RESPIRATORY_TRACT | Status: AC
  Administered 2020-12-10 (×3): 0.5 mg via RESPIRATORY_TRACT
  Filled 2020-12-10 (×3): qty 2.5

## 2020-12-10 MED ORDER — ACETAMINOPHEN 650 MG RE SUPP: 650.0000 mg | Freq: Four times a day (QID) | RECTAL | Status: DC | PRN

## 2020-12-10 MED ORDER — ENOXAPARIN SODIUM 60 MG/0.6ML IJ SOSY
50.0000 mg | PREFILLED_SYRINGE | INTRAMUSCULAR | Status: DC
  Administered 2020-12-10: 50 mg via SUBCUTANEOUS
  Filled 2020-12-10: qty 0.6

## 2020-12-10 MED ORDER — ONDANSETRON HCL 4 MG/2ML IJ SOLN: 4.0000 mg | Freq: Four times a day (QID) | INTRAMUSCULAR | Status: DC | PRN

## 2020-12-10 MED ORDER — GUAIFENESIN ER 600 MG PO TB12
600.0000 mg | ORAL_TABLET | Freq: Two times a day (BID) | ORAL | Status: DC
  Administered 2020-12-10 – 2020-12-11 (×2): 600 mg via ORAL
  Filled 2020-12-10 (×2): qty 1

## 2020-12-10 MED ORDER — ONDANSETRON HCL 4 MG PO TABS: 4.0000 mg | ORAL_TABLET | Freq: Four times a day (QID) | ORAL | Status: DC | PRN

## 2020-12-10 MED ORDER — IOHEXOL 350 MG/ML SOLN
80.0000 mL | Freq: Once | INTRAVENOUS | Status: AC | PRN
  Administered 2020-12-10: 80 mL via INTRAVENOUS

## 2020-12-10 MED ORDER — GUAIFENESIN-DM 100-10 MG/5ML PO SYRP
5.0000 mL | ORAL_SOLUTION | ORAL | Status: DC | PRN
  Administered 2020-12-10 – 2020-12-11 (×3): 5 mL via ORAL
  Filled 2020-12-10 (×3): qty 10

## 2020-12-10 MED ORDER — ALBUTEROL SULFATE (2.5 MG/3ML) 0.083% IN NEBU
5.0000 mg | INHALATION_SOLUTION | RESPIRATORY_TRACT | Status: AC
  Administered 2020-12-10 (×3): 5 mg via RESPIRATORY_TRACT
  Filled 2020-12-10 (×3): qty 6

## 2020-12-10 MED ORDER — DEXAMETHASONE 4 MG PO TABS
10.0000 mg | ORAL_TABLET | Freq: Once | ORAL | Status: AC
  Administered 2020-12-10: 10 mg via ORAL
  Filled 2020-12-10: qty 2

## 2020-12-10 MED ORDER — NICOTINE 21 MG/24HR TD PT24
21.0000 mg | MEDICATED_PATCH | Freq: Every day | TRANSDERMAL | Status: DC
  Administered 2020-12-10 – 2020-12-11 (×2): 21 mg via TRANSDERMAL
  Filled 2020-12-10 (×2): qty 1

## 2020-12-10 MED ORDER — PREDNISONE 20 MG PO TABS
40.0000 mg | ORAL_TABLET | Freq: Every day | ORAL | Status: DC
  Administered 2020-12-11: 40 mg via ORAL
  Filled 2020-12-10: qty 2

## 2020-12-10 MED ORDER — MELATONIN 3 MG PO TABS
3.0000 mg | ORAL_TABLET | Freq: Every day | ORAL | Status: DC
  Administered 2020-12-10: 3 mg via ORAL
  Filled 2020-12-10: qty 1

## 2020-12-10 MED ORDER — IPRATROPIUM-ALBUTEROL 0.5-2.5 (3) MG/3ML IN SOLN
3.0000 mL | Freq: Four times a day (QID) | RESPIRATORY_TRACT | Status: DC
  Administered 2020-12-10 – 2020-12-11 (×4): 3 mL via RESPIRATORY_TRACT
  Filled 2020-12-10 (×3): qty 3

## 2020-12-10 MED ORDER — ACETAMINOPHEN 325 MG PO TABS
650.0000 mg | ORAL_TABLET | Freq: Four times a day (QID) | ORAL | Status: DC | PRN
  Administered 2020-12-11: 650 mg via ORAL
  Filled 2020-12-10: qty 2

## 2020-12-10 NOTE — ED Notes (Signed)
PA-C at the bedside for ambulatory O2 sats.

## 2020-12-10 NOTE — ED Provider Notes (Signed)
Patient care assumed from Charlann Lange, PA-C at shift change. Please see their note for further information.   Briefly: Patient presents for second time in 48 hours with shortness of breath. Given Solu-Medrol, Mag, multiple breathing tx, d/c with albuterol and prednisone. Returned today with worsening of symptoms, given Decadron and is currently receiving 3rd breathing treatment. Of noted, patient was intubated with multifocal pneumonia and leukocytosis in 2019.  Plan: Ambulate with pulse ox following end of 3rd breathing treatment. If feels better and successfully ambulates she can go.   Upon ambulation, patient found to desaturate to the 80s with minimal exertion, no acute distress noted, however considering desaturation following several rounds of nebulizers and steroids and second ED visit, feel that patient should be admitted for further evaluation. Given hypoxia without obvious etiology, will order CT PE as well. Patient is in agreement and is amenable with plans of admission.  Discussed patient with hospitalist Dr. Marylyn Ishihara who agrees to admit.  Findings and plan of care discussed with supervising physician Dr. Tyrone Nine who is in agreement.      Bud Face, PA-C 12/10/20 [place], [place], DO 12/10/20 1132

## 2020-12-10 NOTE — ED Notes (Signed)
Pt attached to cardiac monitor x3. A&O x4. VSS. Pt with dry cough at the beside. Otherwise NAD.

## 2020-12-10 NOTE — ED Notes (Signed)
Hospitalist at the bedside 

## 2020-12-10 NOTE — ED Notes (Signed)
Pt up and ambulatory to bathroom.

## 2020-12-10 NOTE — H&P (Signed)
History and Physical    Catherine Cannon JKK:[id number] DOB: Nov 13, 1980 DOA: 12/09/2020  PCP: Pcp, No  Patient coming from: Home  Chief Complaint: shortness of breath  HPI: Catherine Cannon is a 40 y.o. female with medical history significant of tobacco abuse. Presenting with shortness of breath. She has had cold symptoms for at least a month. 3 weeks ago, she went to urgent care and was given abx for sinus infection. The abx helped, but shortly after, she began having shortness of breath. She had a non-productive cough. She tried mucinex and nyquil, but it didn't help. She came to the ED 2 days ago and was dyspniec. She was given negs, steroids, and magnesium. She improved and was sent home with an albuterol neb. Her symptoms returned yesterday and have worsened. So she decided to come back to the ED. She denies any other aggravating or alleviating factors.   ED Course: She is hypoxic w/ ambulation. CXR was negative. CTA chest was ordered. She was given nebs and decadron. TRH was called for admission.   Review of Systems:  Denies CP, palpitations, lightheadedness, dizziness, N/V/D, fever. Review of systems is otherwise negative for all not mentioned in HPI.   PMHx Past Medical History:  Diagnosis Date   Acute respiratory failure with hypoxia ([place]) 10/16/2017   ARDS (adult respiratory distress syndrome) ([place]) 10/19/2017   Multifocal pneumonia 10/16/2017   Sepsis ([place]) 10/16/2017    PSHx Past Surgical History:  Procedure Laterality Date   NO PAST SURGERIES      SocHx  reports that she has been smoking cigarettes. She has been smoking an average of .5 packs per day. She has never used smokeless tobacco. She reports current alcohol use. She reports that she does not currently use drugs after having used the following drugs: Cocaine.  No Known Allergies  FamHx Family History  Problem Relation Age of Onset   Asthma Mother     Prior to Admission medications   Medication Sig Start Date End  Date Taking? Authorizing Provider  acetaminophen (TYLENOL) 500 MG tablet Take 500 mg by mouth every 4 (four) hours as needed for mild pain.    [provider]  albuterol (PROVENTIL) (2.5 MG/3ML) 0.083% nebulizer solution Take 3 mLs (2.5 mg total) by nebulization every 6 (six) hours as needed for wheezing or shortness of breath. 12/08/20   Caccavale, Sophia, PA-C  amoxicillin-clavulanate (AUGMENTIN) 875-125 MG tablet Take 1 tablet by mouth 2 (two) times daily. Patient not taking: Reported on 12/08/2020 11/18/20   Jaynee Eagles, PA-C  benzonatate (TESSALON) 100 MG capsule Take 1-2 capsules (100-200 mg total) by mouth 3 (three) times daily as needed. 11/18/20   Jaynee Eagles, PA-C  cetirizine (ZYRTEC) 10 MG tablet Take 10 mg by mouth daily as needed for allergies.    [provider]  ipratropium-albuterol (DUONEB) 0.5-2.5 (3) MG/3ML SOLN Take 3 mLs by nebulization every 6 (six) hours as needed (shortness of breath and wheezing). 04/04/20   Scot Jun, FNP  predniSONE (DELTASONE) 10 MG tablet Take 4 tablets (40 mg total) by mouth daily for 5 days. 12/09/20 12/14/20  Caccavale, Sophia, PA-C  promethazine-dextromethorphan (PROMETHAZINE-DM) 6.25-15 MG/5ML syrup Take 5 mLs by mouth at bedtime as needed for cough. Patient not taking: Reported on 12/08/2020 11/18/20   Jaynee Eagles, PA-C  Pseudoeph-Doxylamine-DM-APAP Chauncy Passy D COLD/FLU PO) Take 2 capsules by mouth every 6 (six) hours as needed (cold symptoms).    [provider]  Pseudoephedrine-APAP-DM (DAYQUIL MULTI-SYMPTOM COLD/FLU PO) Take 2 capsules by  mouth daily as needed (cough/cold).    [provider]    Physical Exam: Vitals:   12/10/20 0630 12/10/20 0740 12/10/20 0800 12/10/20 0844  BP: 131/74 (!) 148/77 140/78 134/76  Pulse: 90 99 92 94  Resp: 20 20 20  (!) 22  Temp:  98.3 F (36.8 C)    TempSrc:  Oral    SpO2: 97% 92% 91% 92%    General: 40 y.o. female resting in bed in NAD Eyes: PERRL, normal  sclera ENMT: Nares patent w/o discharge, orophaynx clear, dentition normal, ears w/o discharge/lesions/ulcers Neck: Supple, trachea midline Cardiovascular: RRR, +S1, S2, no m/g/r, equal pulses throughout Respiratory: decreased at bases, minor expiratory wheeze centrally, somehwat increased WOB on RA GI: BS+, NDNT, no masses noted, no organomegaly noted MSK: No e/c/c Neuro: A&O x 3, no focal deficits Psyc: Appropriate interaction and affect, calm/cooperative  Labs on Admission: I have personally reviewed following labs and imaging studies  CBC: Recent Labs  Lab 12/08/20 1101  WBC 10.1  NEUTROABS 5.7  HGB 16.7*  HCT 49.7*  MCV 95.6  PLT 962   Basic Metabolic Panel: Recent Labs  Lab 12/08/20 1101  NA 135  K 3.7  CL 104  CO2 22  GLUCOSE 110*  BUN 5*  CREATININE 0.58  CALCIUM 9.3   GFR: Estimated Creatinine Clearance: 117.8 mL/min (by C-G formula based on SCr of 0.58 mg/dL). Liver Function Tests: Recent Labs  Lab 12/08/20 1101  AST 43*  ALT 55*  ALKPHOS 76  BILITOT 0.4  PROT 7.4  ALBUMIN 4.2   No results for input(s): LIPASE, AMYLASE in the last 168 hours. No results for input(s): AMMONIA in the last 168 hours. Coagulation Profile: No results for input(s): INR, PROTIME in the last 168 hours. Cardiac Enzymes: No results for input(s): CKTOTAL, CKMB, CKMBINDEX, TROPONINI in the last 168 hours. BNP (last 3 results) No results for input(s): PROBNP in the last 8760 hours. HbA1C: No results for input(s): HGBA1C in the last 72 hours. CBG: No results for input(s): GLUCAP in the last 168 hours. Lipid Profile: No results for input(s): CHOL, HDL, LDLCALC, TRIG, CHOLHDL, LDLDIRECT in the last 72 hours. Thyroid Function Tests: No results for input(s): TSH, T4TOTAL, FREET4, T3FREE, THYROIDAB in the last 72 hours. Anemia Panel: No results for input(s): VITAMINB12, FOLATE, FERRITIN, TIBC, IRON, RETICCTPCT in the last 72 hours. Urine analysis:    Component Value  Date/Time   COLORURINE YELLOW 10/16/2017 2107   APPEARANCEUR CLEAR 10/16/2017 2107   LABSPEC 1.025 12/22/2017 1517   PHURINE 6.0 12/22/2017 1517   GLUCOSEU NEGATIVE 12/22/2017 1517   HGBUR TRACE (A) 12/22/2017 1517   BILIRUBINUR NEGATIVE 12/22/2017 1517   KETONESUR NEGATIVE 12/22/2017 1517   PROTEINUR NEGATIVE 12/22/2017 1517   UROBILINOGEN 0.2 12/22/2017 1517   NITRITE NEGATIVE 12/22/2017 1517   LEUKOCYTESUR LARGE (A) 12/22/2017 1517    Radiological Exams on Admission: DG Chest 2 View  Result Date: 12/09/2020 CLINICAL DATA:  Increasing shortness of breath from the previous day EXAM: CHEST - 2 VIEW COMPARISON:  12/08/2020 FINDINGS: Cardiac shadow is within normal limits. The lungs are well aerated bilaterally. No focal infiltrate or sizable effusion is seen. Persistent coarsening of the interstitium is again seen. No bony abnormality is noted. IMPRESSION: Stable appearance of the chest from the previous day. Electronically Signed   By: Inez Catalina M.D.   On: 12/09/2020 22:08   DG Chest Portable 1 View  Result Date: 12/08/2020 CLINICAL DATA:  Pt complained of a worsening dry cough  and SOB x 1 month. EXAM: PORTABLE CHEST 1 VIEW COMPARISON:  Chest radiograph 11/18/2020 FINDINGS: The cardiomediastinal contours are within normal limits. There is mild coarsening of the interstitium likely related to smoking history. No new focal consolidation. No pneumothorax or pleural effusion. No acute finding in the visualized skeleton. IMPRESSION: No evidence of active disease in the chest. Electronically Signed   By: Audie Pinto M.D.   On: 12/08/2020 11:21    EKG: Independently reviewed. Sinus tachy, no st elevations  Assessment/Plan Dyspnea     - place in tele, obs     - no previous history of COPD/asthma, but she is a smoker     - decreased air movement at the bases, minor exp wheeze on exam     - nebs, steroids, guaifenesin     - CTA chest ordered; awaiting results     - no edema on exam  and EKG shows sinus tach, no st elevations     - will check BNP     - COVID/flu negative; white count is normal, not getting sense of infection here  Elevated LFTs     - mild elevations; check hepatitis panel  Tobacco abuse     - counseled against further use; nicotine pathc ordered  DVT prophylaxis: lovenox  Code Status: FULL  Family Communication: w/ SO at bedside  Consults called: None   Status is: Observation  The patient remains OBS appropriate and will d/c before 2 midnights.   Jonnie Finner DO [facility]  If 7PM-7AM, please contact night-coverage [email or web address]  12/10/2020, 9:38 AM

## 2020-12-10 NOTE — ED Notes (Signed)
Report sent to the floor.

## 2020-12-10 NOTE — ED Provider Notes (Signed)
[facility] DEPT Provider Note   CSN: [id number] Arrival date & time: 12/09/20  2007     History Chief Complaint  Patient presents with   Shortness of Breath    Catherine Cannon is a 40 y.o. female.  Patient to ED with SOB, cough and wheezing x 3 days. No fever. She denies history of diagnosed asthma but has a nebulizer at home that she got when she had COVID earlier this year. She is a continuous smoker. She reports history of respiratory failure requiring intubation in the past. She states her SOB is worse when lying down or when walking short distances. No history of heart failure and no reported LE edema. She was seen in the ED 10/16 (48 hours ago) and received IV Solumedrol, IV magnesium, multiple breathing treatments and was ultimately discharged home on 40 mg prednisone feeling improved. Symptoms worsened again last night. Still no fever.   The history is provided by the patient. No language interpreter was used.  Shortness of Breath Associated symptoms: cough and wheezing   Associated symptoms: no fever       Past Medical History:  Diagnosis Date   Acute respiratory failure with hypoxia ([place]) 10/16/2017   ARDS (adult respiratory distress syndrome) ([place]) 10/19/2017   Multifocal pneumonia 10/16/2017   Sepsis ([place]) 10/16/2017    Patient Active Problem List   Diagnosis Date Noted   History of MRSA infection 03/14/2018   Obesity 12/22/2017   Tobacco use 12/22/2017   Intramural uterine fibroid - Retroplacental  12/22/2017   Trichomonal vaginitis during pregnancy in second trimester 11/15/2017    Past Surgical History:  Procedure Laterality Date   NO PAST SURGERIES       OB History     Gravida  1   Para  1   Term  1   Preterm      AB      Living  1      SAB      IAB      Ectopic      Multiple  0   Live Births  1           Family History  Problem Relation Age of Onset   Asthma Mother     Social History    Tobacco Use   Smoking status: Every Day    Packs/day: 0.50    Types: Cigarettes   Smokeless tobacco: Never  Vaping Use   Vaping Use: Never used  Substance Use Topics   Alcohol use: Yes    Comment: occ    Drug use: Not Currently    Types: Cocaine    Comment: years ago    Home Medications Prior to Admission medications   Medication Sig Start Date End Date Taking? Authorizing Provider  acetaminophen (TYLENOL) 500 MG tablet Take 500 mg by mouth every 4 (four) hours as needed for mild pain.    [provider]  albuterol (PROVENTIL) (2.5 MG/3ML) 0.083% nebulizer solution Take 3 mLs (2.5 mg total) by nebulization every 6 (six) hours as needed for wheezing or shortness of breath. 12/08/20   Caccavale, Sophia, PA-C  amoxicillin-clavulanate (AUGMENTIN) 875-125 MG tablet Take 1 tablet by mouth 2 (two) times daily. Patient not taking: Reported on 12/08/2020 11/18/20   Jaynee Eagles, PA-C  benzonatate (TESSALON) 100 MG capsule Take 1-2 capsules (100-200 mg total) by mouth 3 (three) times daily as needed. 11/18/20   Jaynee Eagles, PA-C  cetirizine (ZYRTEC) 10 MG tablet Take 10 mg  by mouth daily as needed for allergies.    [provider]  ipratropium-albuterol (DUONEB) 0.5-2.5 (3) MG/3ML SOLN Take 3 mLs by nebulization every 6 (six) hours as needed (shortness of breath and wheezing). 04/04/20   Scot Jun, FNP  predniSONE (DELTASONE) 10 MG tablet Take 4 tablets (40 mg total) by mouth daily for 5 days. 12/09/20 12/14/20  Caccavale, Sophia, PA-C  promethazine-dextromethorphan (PROMETHAZINE-DM) 6.25-15 MG/5ML syrup Take 5 mLs by mouth at bedtime as needed for cough. Patient not taking: Reported on 12/08/2020 11/18/20   Jaynee Eagles, PA-C  Pseudoeph-Doxylamine-DM-APAP Chauncy Passy D COLD/FLU PO) Take 2 capsules by mouth every 6 (six) hours as needed (cold symptoms).    [provider]  Pseudoephedrine-APAP-DM (DAYQUIL MULTI-SYMPTOM COLD/FLU PO) Take 2 capsules by mouth daily as  needed (cough/cold).    [provider]    Allergies    Patient has no known allergies.  Review of Systems   Review of Systems  Constitutional:  Negative for chills and fever.  HENT: Negative.    Respiratory:  Positive for cough, chest tightness, shortness of breath and wheezing.   Cardiovascular: Negative.  Negative for leg swelling.  Gastrointestinal: Negative.   Musculoskeletal: Negative.   Skin: Negative.   Neurological: Negative.    Physical Exam Updated Vital Signs BP (!) 155/78   Pulse 93   Temp 98.9 F (37.2 C) (Oral)   Resp (!) 21   LMP 11/10/2020   SpO2 96%   Physical Exam Nursing note reviewed.  Constitutional:      Appearance: She is well-developed.  HENT:     Head: Normocephalic.  Cardiovascular:     Rate and Rhythm: Normal rate and regular rhythm.     Heart sounds: No murmur heard. Pulmonary:     Effort: Pulmonary effort is normal. No tachypnea or respiratory distress.     Breath sounds: Wheezing present. No rhonchi or rales.  Abdominal:     General: Bowel sounds are normal.     Palpations: Abdomen is soft.     Tenderness: There is no abdominal tenderness. There is no guarding or rebound.  Musculoskeletal:        General: Normal range of motion.     Cervical back: Normal range of motion and neck supple.     Right lower leg: No edema.     Left lower leg: No edema.  Skin:    General: Skin is warm and dry.  Neurological:     General: No focal deficit present.     Mental Status: She is alert and oriented to person, place, and time.    ED Results / Procedures / Treatments   Labs (all labs ordered are listed, but only abnormal results are displayed) Labs Reviewed  TROPONIN I (HIGH SENSITIVITY)  TROPONIN I (HIGH SENSITIVITY)    EKG None  Radiology DG Chest 2 View  Result Date: 12/09/2020 CLINICAL DATA:  Increasing shortness of breath from the previous day EXAM: CHEST - 2 VIEW COMPARISON:  12/08/2020 FINDINGS: Cardiac shadow is  within normal limits. The lungs are well aerated bilaterally. No focal infiltrate or sizable effusion is seen. Persistent coarsening of the interstitium is again seen. No bony abnormality is noted. IMPRESSION: Stable appearance of the chest from the previous day. Electronically Signed   By: Inez Catalina M.D.   On: 12/09/2020 22:08   DG Chest Portable 1 View  Result Date: 12/08/2020 CLINICAL DATA:  Pt complained of a worsening dry cough and SOB x 1 month. EXAM: PORTABLE  CHEST 1 VIEW COMPARISON:  Chest radiograph 11/18/2020 FINDINGS: The cardiomediastinal contours are within normal limits. There is mild coarsening of the interstitium likely related to smoking history. No new focal consolidation. No pneumothorax or pleural effusion. No acute finding in the visualized skeleton. IMPRESSION: No evidence of active disease in the chest. Electronically Signed   By: Audie Pinto M.D.   On: 12/08/2020 11:21    Procedures Procedures   Medications Ordered in ED Medications  albuterol (VENTOLIN HFA) 108 (90 Base) MCG/ACT inhaler 2 puff (2 puffs Inhalation Given 12/09/20 2101)  albuterol (PROVENTIL) (2.5 MG/3ML) 0.083% nebulizer solution 5 mg (has no administration in time range)  ipratropium (ATROVENT) nebulizer solution 0.5 mg (has no administration in time range)  methylPREDNISolone sodium succinate (SOLU-MEDROL) 125 mg/2 mL injection 125 mg (has no administration in time range)    ED Course  I have reviewed the triage vital signs and the nursing notes.  Pertinent labs & imaging results that were available during my care of the patient were reviewed by me and considered in my medical decision making (see chart for details).    MDM Rules/Calculators/A&P                           Patient returns to the ED with continued wheezing and SoB after being seen for same 48 hours ago. She is a smoker, history of wheezing in the past without diagnosis of asthma or COPD.   She is wheezing on auscultation.  Normal O2 saturation. No sign of sepsis. Labs/CXR from 10/16 reviewed and do not need to be repeated.   Albuterol/Atrovent nebulizer treatments x 3 started. PO Decadron 10 mg.   The patient reports being anxious, unsettled. She states she has had Ativan in the past that helped. Ativan 0.5 mg ordered.   Patient care signed out to Lavonna Rua, PA-C, for re-evaluation after final neb treatment. She will need to be ambulated with pulse ox to insure no hypoxia.   Final Clinical Impression(s) / ED Diagnoses Final diagnoses:  None   Wheezing Dyspnea  Rx / DC Orders ED Discharge Orders     None        Charlann Lange, PA-C 12/10/20 5364    Molpus, Jenny Reichmann, MD 12/10/20 [phone]

## 2020-12-11 DIAGNOSIS — R0603 Acute respiratory distress: Secondary | ICD-10-CM | POA: Diagnosis not present

## 2020-12-11 DIAGNOSIS — R0602 Shortness of breath: Secondary | ICD-10-CM | POA: Diagnosis not present

## 2020-12-11 LAB — URINALYSIS, ROUTINE W REFLEX MICROSCOPIC
Bilirubin Urine: NEGATIVE
Glucose, UA: NEGATIVE mg/dL
Hgb urine dipstick: NEGATIVE
Ketones, ur: NEGATIVE mg/dL
Leukocytes,Ua: NEGATIVE
Nitrite: NEGATIVE
Protein, ur: NEGATIVE mg/dL
Specific Gravity, Urine: 1.025 (ref 1.005–1.030)
pH: 5 (ref 5.0–8.0)

## 2020-12-11 LAB — COMPREHENSIVE METABOLIC PANEL
ALT: 47 U/L — ABNORMAL HIGH (ref 0–44)
AST: 36 U/L (ref 15–41)
Albumin: 3.8 g/dL (ref 3.5–5.0)
Alkaline Phosphatase: 71 U/L (ref 38–126)
Anion gap: 9 (ref 5–15)
BUN: 13 mg/dL (ref 6–20)
CO2: 23 mmol/L (ref 22–32)
Calcium: 9.3 mg/dL (ref 8.9–10.3)
Chloride: 104 mmol/L (ref 98–111)
Creatinine, Ser: 0.62 mg/dL (ref 0.44–1.00)
GFR, Estimated: >60 mL/min (ref >60)
Glucose, Bld: 136 mg/dL — ABNORMAL HIGH (ref 70–99)
Potassium: 4.4 mmol/L (ref 3.5–5.1)
Sodium: 136 mmol/L (ref 135–145)
Total Bilirubin: 0.5 mg/dL (ref 0.3–1.2)
Total Protein: 7.2 g/dL (ref 6.5–8.1)

## 2020-12-11 LAB — HEPATITIS PANEL, ACUTE
HCV Ab: NONREACTIVE
Hep A IgM: NONREACTIVE
Hep B C IgM: NONREACTIVE
Hepatitis B Surface Ag: NONREACTIVE

## 2020-12-11 LAB — RESPIRATORY PANEL BY PCR

## 2020-12-11 LAB — CBC
HCT: 49 % — ABNORMAL HIGH (ref 36.0–46.0)
Hemoglobin: 16.4 g/dL — ABNORMAL HIGH (ref 12.0–15.0)
MCH: 32 pg (ref 26.0–34.0)
MCHC: 33.5 g/dL (ref 30.0–36.0)
MCV: 95.7 fL (ref 80.0–100.0)
Platelets: 233 10*3/uL (ref 150–400)
RBC: 5.12 MIL/uL — ABNORMAL HIGH (ref 3.87–5.11)
RDW: 14 % (ref 11.5–15.5)
WBC: 15.9 10*3/uL — ABNORMAL HIGH (ref 4.0–10.5)
nRBC: 0 % (ref 0.0–0.2)

## 2020-12-11 LAB — HIV ANTIBODY (ROUTINE TESTING W REFLEX): HIV Screen 4th Generation wRfx: NONREACTIVE

## 2020-12-11 MED ORDER — GUAIFENESIN-CODEINE 100-10 MG/5ML PO SOLN: 5.0000 mL | Freq: Four times a day (QID) | ORAL | 0 refills | Status: DC | PRN

## 2020-12-11 MED ORDER — IPRATROPIUM-ALBUTEROL 0.5-2.5 (3) MG/3ML IN SOLN: 3.0000 mL | Freq: Two times a day (BID) | RESPIRATORY_TRACT | Status: DC

## 2020-12-11 MED ORDER — HYDROXYZINE HCL 25 MG PO TABS: 25.0000 mg | ORAL_TABLET | Freq: Three times a day (TID) | ORAL | 0 refills | Status: DC | PRN

## 2020-12-11 MED ORDER — NICOTINE 21 MG/24HR TD PT24: 21.0000 mg | MEDICATED_PATCH | Freq: Every day | TRANSDERMAL | 0 refills | Status: DC

## 2020-12-11 MED ORDER — BENZONATATE 100 MG PO CAPS
200.0000 mg | ORAL_CAPSULE | Freq: Three times a day (TID) | ORAL | Status: DC
  Administered 2020-12-11: 200 mg via ORAL
  Filled 2020-12-11: qty 2

## 2020-12-11 MED ORDER — MENTHOL 3 MG MT LOZG: 1.0000 | LOZENGE | OROMUCOSAL | Status: DC | PRN

## 2020-12-11 MED ORDER — BUDESONIDE 0.25 MG/2ML IN SUSP: 0.2500 mg | Freq: Two times a day (BID) | RESPIRATORY_TRACT | Status: DC

## 2020-12-11 MED ORDER — PREDNISONE 10 MG PO TABS: ORAL_TABLET | ORAL | 0 refills | Status: AC

## 2020-12-11 NOTE — Discharge Summary (Signed)
Physician Discharge Summary  Catherine Cannon UYQ:[id number] DOB: 1981-01-09 DOA: 12/09/2020  PCP: Pcp, No  Admit date: 12/09/2020 Discharge date: 12/11/2020  Admitted From: home Disposition:  home  Recommendations for Outpatient Follow-up:  Follow up with PCP in 1-2 weeks  Home Health: None Equipment/Devices: None  Discharge Condition: Stable CODE STATUS: Full code Diet recommendation: Regular  HPI: Per admitting MD, Catherine Cannon is a 40 y.o. female with medical history significant of tobacco abuse. Presenting with shortness of breath. She has had cold symptoms for at least a month. 3 weeks ago, she went to urgent care and was given abx for sinus infection. The abx helped, but shortly after, she began having shortness of breath. She had a non-productive cough. She tried mucinex and nyquil, but it didn't help. She came to the ED 2 days ago and was dyspniec. She was given negs, steroids, and magnesium. She improved and was sent home with an albuterol neb. Her symptoms returned yesterday and have worsened. So she decided to come back to the ED. She denies any other aggravating or alleviating factors.   Hospital Course / Discharge diagnoses: Principal problem Significant dyspnea, severe cough-patient was admitted to the hospital following a viral illness roughly about 3 weeks ago (her kids were also sick), and with persistent and progressive shortness of breath and wheezing.  Here she tested negative for COVID, influenza and had a negative respiratory virus panel.  She previously completed a course of antibiotics when she initially got sick.  She has no history of COPD but I do suspect that she may have some underlying reactive airway disease/early COPD.  She was placed on nebulizers, steroids, antitussives and improved.  She is able to ambulate in the hallway on room air without significant difficulties and will be discharged home in stable condition.  She will be placed on a prednisone  taper.   Active problems Leukocytosis-likely due to steroids, she took couple doses of prednisone prior to coming in Elevated LFTs-mild, improving, hepatitis panel negative.  Clinically suspect fatty liver disease Obesity, class I-BMI 35-she would benefit from weight loss Tobacco abuse-counseled about cessation, prescribed a nicotine patch  Sepsis ruled out   Discharge Instructions   Allergies as of 12/11/2020   No Known Allergies      Medication List     STOP taking these medications    amoxicillin-clavulanate 875-125 MG tablet Commonly known as: AUGMENTIN   promethazine-dextromethorphan 6.25-15 MG/5ML syrup Commonly known as: PROMETHAZINE-DM       TAKE these medications    acetaminophen 500 MG tablet Commonly known as: TYLENOL Take 500 mg by mouth every 4 (four) hours as needed for mild pain.   albuterol (2.5 MG/3ML) 0.083% nebulizer solution Commonly known as: PROVENTIL Take 3 mLs (2.5 mg total) by nebulization every 6 (six) hours as needed for wheezing or shortness of breath.   benzonatate 100 MG capsule Commonly known as: TESSALON Take 1-2 capsules (100-200 mg total) by mouth 3 (three) times daily as needed.   cetirizine 10 MG tablet Commonly known as: ZYRTEC Take 10 mg by mouth daily as needed for allergies.   DAYQUIL MULTI-SYMPTOM COLD/FLU PO Take 2 capsules by mouth daily as needed (cough/cold).   guaiFENesin-codeine 100-10 MG/5ML syrup Take 5 mLs by mouth every 6 (six) hours as needed for cough.   hydrOXYzine 25 MG tablet Commonly known as: ATARAX/VISTARIL Take 1 tablet (25 mg total) by mouth 3 (three) times daily as needed for anxiety.   ipratropium-albuterol 0.5-2.5 (3) MG/3ML Soln  Commonly known as: DUONEB Take 3 mLs by nebulization every 6 (six) hours as needed (shortness of breath and wheezing).   nicotine 21 mg/24hr patch Commonly known as: NICODERM CQ - dosed in mg/24 hours Place 1 patch (21 mg total) onto the skin daily. Start  taking on: December 12, 2020   NYQUIL D COLD/FLU PO Take 2 capsules by mouth every 6 (six) hours as needed (cold symptoms).   predniSONE 10 MG tablet Commonly known as: DELTASONE Take 4 tablets (40 mg total) by mouth daily for 2 days, THEN 3 tablets (30 mg total) daily for 2 days, THEN 2 tablets (20 mg total) daily for 2 days, THEN 1 tablet (10 mg total) daily for 2 days. Start taking on: December 11, 2020 What changed: See the new instructions.       Consultations: None   Procedures/Studies:  DG Chest 2 View  Result Date: 12/09/2020 CLINICAL DATA:  Increasing shortness of breath from the previous day EXAM: CHEST - 2 VIEW COMPARISON:  12/08/2020 FINDINGS: Cardiac shadow is within normal limits. The lungs are well aerated bilaterally. No focal infiltrate or sizable effusion is seen. Persistent coarsening of the interstitium is again seen. No bony abnormality is noted. IMPRESSION: Stable appearance of the chest from the previous day. Electronically Signed   By: Inez Catalina M.D.   On: 12/09/2020 22:08   DG Chest 2 View  Result Date: 11/18/2020 CLINICAL DATA:  Cough and shortness of breath. EXAM: CHEST - 2 VIEW COMPARISON:  Chest x-ray dated April 04, 2020. FINDINGS: The heart size and mediastinal contours are within normal limits. Both lungs are clear. The visualized skeletal structures are unremarkable. IMPRESSION: No active cardiopulmonary disease. Electronically Signed   By: Titus Dubin M.D.   On: 11/18/2020 11:21   CT Angio Chest PE W and/or Wo Contrast  Result Date: 12/10/2020 CLINICAL DATA:  Hypoxia. EXAM: CT ANGIOGRAPHY CHEST WITH CONTRAST TECHNIQUE: Multidetector CT imaging of the chest was performed using the standard protocol during bolus administration of intravenous contrast. Multiplanar CT image reconstructions and MIPs were obtained to evaluate the vascular anatomy. CONTRAST:  56mL OMNIPAQUE IOHEXOL 350 MG/ML SOLN COMPARISON:  None two-view chest x-ray 12/09/2020 CTA  chest 10/16/2017 FINDINGS: Cardiovascular: The heart size is normal. Aorta and great vessel origins are within normal limits. Pulmonary artery opacification is excellent. No focal filling defects are present to suggest pulmonary arteries. Pulmonary artery size is within normal limits. No significant pericardial effusion is present. Mediastinum/Nodes: No enlarged mediastinal, hilar, or axillary lymph nodes. Thyroid gland, trachea, and esophagus demonstrate no significant findings. Lungs/Pleura: Lungs are clear. No pleural effusion or pneumothorax. Upper Abdomen: Limited imaging the abdomen is unremarkable. There is no significant adenopathy. No solid organ lesions are present. Musculoskeletal: Vertebral body heights and alignment are normal. Rightward curvature is centered in the midthoracic spine. No osseous lesions are present. Ribs are unremarkable. Review of the MIP images confirms the above findings. IMPRESSION: 1. No pulmonary embolus. 2. No acute or focal lesion to explain the patient's shortness of breath. 3. Rightward curvature of the midthoracic spine. Electronically Signed   By: San Morelle M.D.   On: 12/10/2020 12:16   DG Chest Portable 1 View  Result Date: 12/08/2020 CLINICAL DATA:  Pt complained of a worsening dry cough and SOB x 1 month. EXAM: PORTABLE CHEST 1 VIEW COMPARISON:  Chest radiograph 11/18/2020 FINDINGS: The cardiomediastinal contours are within normal limits. There is mild coarsening of the interstitium likely related to smoking history. No new focal  consolidation. No pneumothorax or pleural effusion. No acute finding in the visualized skeleton. IMPRESSION: No evidence of active disease in the chest. Electronically Signed   By: Audie Pinto M.D.   On: 12/08/2020 11:21     Subjective: - no chest pain, shortness of breath, no abdominal pain, nausea or vomiting.   Discharge Exam: BP 137/89 (BP Location: Right Arm)   Pulse 82   Temp 98 F (36.7 C)   Resp 18   Ht  5\' 8"  (1.727 m)   Wt 105.3 kg   SpO2 95%   BMI 35.30 kg/m   General: Pt is alert, awake, not in acute distress Cardiovascular: RRR, S1/S2 +, no rubs, no gallops Respiratory: CTA bilaterally, no wheezing, no rhonchi Abdominal: Soft, NT, ND, bowel sounds + Extremities: no edema, no cyanosis  The results of significant diagnostics from this hospitalization (including imaging, microbiology, ancillary and laboratory) are listed below for reference.     Microbiology: Recent Results (from the past 240 hour(s))  Resp Panel by RT-PCR (Flu A&B, Covid) Nasopharyngeal Swab     Status: None   Collection Time: 12/08/20 11:01 AM   Specimen: Nasopharyngeal Swab; Nasopharyngeal(NP) swabs in vial transport medium  Result Value Ref Range Status   SARS Coronavirus 2 by RT PCR NEGATIVE NEGATIVE Final    Comment: (NOTE) SARS-CoV-2 target nucleic acids are NOT DETECTED.  The SARS-CoV-2 RNA is generally detectable in upper respiratory specimens during the acute phase of infection. The lowest concentration of SARS-CoV-2 viral copies this assay can detect is 138 copies/mL. A negative result does not preclude SARS-Cov-2 infection and should not be used as the sole basis for treatment or other patient management decisions. A negative result may occur with  improper specimen collection/handling, submission of specimen other than nasopharyngeal swab, presence of viral mutation(s) within the areas targeted by this assay, and inadequate number of viral copies(<138 copies/mL). A negative result must be combined with clinical observations, patient history, and epidemiological information. The expected result is Negative.  Fact Sheet for Patients:  EntrepreneurPulse.com.au  Fact Sheet for Healthcare Providers:  IncredibleEmployment.be  This test is no t yet approved or cleared by the [place] FDA and  has been authorized for detection and/or diagnosis of SARS-CoV-2  by FDA under an Emergency Use Authorization (EUA). This EUA will remain  in effect (meaning this test can be used) for the duration of the COVID-19 declaration under Section 564(b)(1) of the Act, 21 U.S.C.section 360bbb-3(b)(1), unless the authorization is terminated  or revoked sooner.       Influenza A by PCR NEGATIVE NEGATIVE Final   Influenza B by PCR NEGATIVE NEGATIVE Final    Comment: (NOTE) The Xpert Xpress SARS-CoV-2/FLU/RSV plus assay is intended as an aid in the diagnosis of influenza from Nasopharyngeal swab specimens and should not be used as a sole basis for treatment. Nasal washings and aspirates are unacceptable for Xpert Xpress SARS-CoV-2/FLU/RSV testing.  Fact Sheet for Patients: EntrepreneurPulse.com.au  Fact Sheet for Healthcare Providers: IncredibleEmployment.be  This test is not yet approved or cleared by the [place] FDA and has been authorized for detection and/or diagnosis of SARS-CoV-2 by FDA under an Emergency Use Authorization (EUA). This EUA will remain in effect (meaning this test can be used) for the duration of the COVID-19 declaration under Section 564(b)(1) of the Act, 21 U.S.C. section 360bbb-3(b)(1), unless the authorization is terminated or revoked.  Performed at [facility], [place] [address], [place], [place] [address]   Respiratory (~20 pathogens)  panel by PCR     Status: None   Collection Time: 12/10/20 10:28 PM   Specimen: Nasopharyngeal Swab; Respiratory  Result Value Ref Range Status   Adenovirus NOT DETECTED NOT DETECTED Final   Coronavirus 229E NOT DETECTED NOT DETECTED Final    Comment: (NOTE) The Coronavirus on the Respiratory Panel, DOES NOT test for the novel  Coronavirus (2019 nCoV)    Coronavirus HKU1 NOT DETECTED NOT DETECTED Final   Coronavirus NL63 NOT DETECTED NOT DETECTED Final   Coronavirus OC43 NOT DETECTED NOT DETECTED Final   Metapneumovirus NOT DETECTED  NOT DETECTED Final   Rhinovirus / Enterovirus NOT DETECTED NOT DETECTED Final   Influenza A NOT DETECTED NOT DETECTED Final   Influenza B NOT DETECTED NOT DETECTED Final   Parainfluenza Virus 1 NOT DETECTED NOT DETECTED Final   Parainfluenza Virus 2 NOT DETECTED NOT DETECTED Final   Parainfluenza Virus 3 NOT DETECTED NOT DETECTED Final   Parainfluenza Virus 4 NOT DETECTED NOT DETECTED Final   Respiratory Syncytial Virus NOT DETECTED NOT DETECTED Final   Bordetella pertussis NOT DETECTED NOT DETECTED Final   Bordetella Parapertussis NOT DETECTED NOT DETECTED Final   Chlamydophila pneumoniae NOT DETECTED NOT DETECTED Final   Mycoplasma pneumoniae NOT DETECTED NOT DETECTED Final    Comment: Performed at [facility], [place]. [address] [place], [place] [address]     Labs: Basic Metabolic Panel: Recent Labs  Lab 12/08/20 1101 12/10/20 0959 12/11/20 0357  NA 135 135 136  K 3.7 3.4* 4.4  CL 104 101 104  CO2 22 22 23   GLUCOSE 110* 134* 136*  BUN 5* 11 13  CREATININE 0.58 0.73 0.62  CALCIUM 9.3 8.9 9.3   Liver Function Tests: Recent Labs  Lab 12/08/20 1101 12/11/20 0357  AST 43* 36  ALT 55* 47*  ALKPHOS 76 71  BILITOT 0.4 0.5  PROT 7.4 7.2  ALBUMIN 4.2 3.8   CBC: Recent Labs  Lab 12/08/20 1101 12/10/20 0959 12/11/20 0357  WBC 10.1 16.5* 15.9*  NEUTROABS 5.7 14.1*  --   HGB 16.7* 15.6* 16.4*  HCT 49.7* 46.7* 49.0*  MCV 95.6 96.1 95.7  PLT 242 213 233   CBG: No results for input(s): GLUCAP in the last 168 hours. Hgb A1c No results for input(s): HGBA1C in the last 72 hours. Lipid Profile No results for input(s): CHOL, HDL, LDLCALC, TRIG, CHOLHDL, LDLDIRECT in the last 72 hours. Thyroid function studies No results for input(s): TSH, T4TOTAL, T3FREE, THYROIDAB in the last 72 hours.  Invalid input(s): FREET3 Urinalysis    Component Value Date/Time   COLORURINE YELLOW 12/11/2020 [place] 12/11/2020 0955   LABSPEC 1.025 12/11/2020 0955    PHURINE 5.0 12/11/2020 0955   GLUCOSEU NEGATIVE 12/11/2020 0955   HGBUR NEGATIVE 12/11/2020 0955   BILIRUBINUR NEGATIVE 12/11/2020 0955   KETONESUR NEGATIVE 12/11/2020 0955   PROTEINUR NEGATIVE 12/11/2020 0955   UROBILINOGEN 0.2 12/22/2017 1517   NITRITE NEGATIVE 12/11/2020 0955   LEUKOCYTESUR NEGATIVE 12/11/2020 0955    FURTHER DISCHARGE INSTRUCTIONS:   Get Medicines reviewed and adjusted: Please take all your medications with you for your next visit with your Primary MD   Laboratory/radiological data: Please request your Primary MD to go over all hospital tests and procedure/radiological results at the follow up, please ask your Primary MD to get all Hospital records sent to his/her office.   In some cases, they will be blood work, cultures and biopsy results pending at the time of your  discharge. Please request that your primary care M.D. goes through all the records of your hospital data and follows up on these results.   Also Note the following: If you experience worsening of your admission symptoms, develop shortness of breath, life threatening emergency, suicidal or homicidal thoughts you must seek medical attention immediately by calling 911 or calling your MD immediately  if symptoms less severe.   You must read complete instructions/literature along with all the possible adverse reactions/side effects for all the Medicines you take and that have been prescribed to you. Take any new Medicines after you have completely understood and accpet all the possible adverse reactions/side effects.    Do not drive when taking Pain medications or sleeping medications (Benzodaizepines)   Do not take more than prescribed Pain, Sleep and Anxiety Medications. It is not advisable to combine anxiety,sleep and pain medications without talking with your primary care practitioner   Special Instructions: If you have smoked or chewed Tobacco  in the last 2 yrs please stop smoking, stop any regular  Alcohol  and or any Recreational drug use.   Wear Seat belts while driving.   Please note: You were cared for by a hospitalist during your hospital stay. Once you are discharged, your primary care physician will handle any further medical issues. Please note that NO REFILLS for any discharge medications will be authorized once you are discharged, as it is imperative that you return to your primary care physician (or establish a relationship with a primary care physician if you do not have one) for your post hospital discharge needs so that they can reassess your need for medications and monitor your lab values.  Time coordinating discharge: 40 minutes  SIGNED:  Marzetta Board, MD, PhD 12/11/2020, 2:37 PM

## 2020-12-11 NOTE — Progress Notes (Signed)
Patient discharged home with husband, discharge instructions given and explained to patient, she verbalized understanding, patient denies any pain/distress. No wound noted, accompanied home by husband, transported to the car by staff.

## 2020-12-11 NOTE — TOC Initial Note (Signed)
Transition of Care [facility]) - Initial/Assessment Note    Patient Details  Name: Catherine Cannon MRN: [id number] Date of Birth: May 26, 1980  [facility]) CM/SW Contact:    Leeroy Cha, RN Phone Number: 12/11/2020, 9:07 AM  Clinical Narrative:                  40 y.o. female with medical history significant of tobacco abuse. Presenting with shortness of breath. She has had cold symptoms for at least a month. 3 weeks ago, she went to urgent care and was given abx for sinus infection. The abx helped, but shortly after, she began having shortness of breath. She had a non-productive cough. She tried mucinex and nyquil, but it didn't help. She came to the ED 2 days ago and was dyspniec. She was given negs, steroids, and magnesium. She improved and was sent home with an albuterol neb. Her symptoms returned yesterday and have worsened. So she decided to come back to the ED. She denies any other aggravating or alleviating factors.    ED Course: She is hypoxic w/ ambulation. CXR was negative. CTA chest was ordered. She was given nebs and decadron. TRH was called for admission.    Review of Systems:  Denies CP, palpitations, lightheadedness, dizziness, N/V/D, fever. Review of systems is otherwise negative for all not mentioned in HPI.    PMHx     Past Medical History:  Diagnosis Date   Acute respiratory failure with hypoxia ([place]) 10/16/2017   ARDS (adult respiratory distress syndrome) ([place]) 10/19/2017   Multifocal pneumonia 10/16/2017   Sepsis ([place]) 10/16/2017      PSHx      Past Surgical History:  Procedure Laterality Date   NO PAST SURGERIES          SocHx  reports that she has been smoking cigarettes. She has been smoking an average of .5 packs per day. She has never used smokeless tobacco. She reports current alcohol use. She reports that she does not currently use drugs after having used the following drugs: Cocaine. TOC PLAN OF CARE following for hhc needs and toc needs,  progression. Expected Discharge Plan: Home/Self Care Barriers to Discharge: Continued Medical Work up   Patient Goals and CMS Choice Patient states their goals for this hospitalization and ongoing recovery are:: to go home CMS Medicare.gov Compare Post Acute Care list provided to:: Patient    Expected Discharge Plan and Services Expected Discharge Plan: Home/Self Care   Discharge Planning Services: CM Consult   Living arrangements for the past 2 months: Single Family Home                                      Prior Living Arrangements/Services Living arrangements for the past 2 months: Single Family Home Lives with:: Spouse Patient language and need for interpreter reviewed:: Yes Do you feel safe going back to the place where you live?: Yes            Criminal Activity/Legal Involvement Pertinent to Current Situation/Hospitalization: No - Comment as needed  Activities of Daily Living Home Assistive Devices/Equipment: None ADL Screening (condition at time of admission) Patient's cognitive ability adequate to safely complete daily activities?: Yes Is the patient deaf or have difficulty hearing?: No Does the patient have difficulty seeing, even when wearing glasses/contacts?: No Does the patient have difficulty concentrating, remembering, or making decisions?: No Patient able to express  need for assistance with ADLs?: Yes Does the patient have difficulty dressing or bathing?: No Independently performs ADLs?: Yes (appropriate for developmental age) Does the patient have difficulty walking or climbing stairs?: Yes (secondary to shortness of breath) Weakness of Legs: Both Weakness of Arms/Hands: None  Permission Sought/Granted                  Emotional Assessment Appearance:: Appears stated age     Orientation: : Oriented to Place, Oriented to Self, Oriented to  Time, Oriented to Situation Alcohol / Substance Use: Illicit Drugs, Tobacco Use Psych  Involvement: No (comment)  Admission diagnosis:  Dyspnea [R06.00] Patient Active Problem List   Diagnosis Date Noted   Dyspnea 12/10/2020   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

## 2021-03-21 ENCOUNTER — Other Ambulatory Visit: Payer: Self-pay

## 2021-03-21 ENCOUNTER — Ambulatory Visit
Admission: RE | Admit: 2021-03-21 | Discharge: 2021-03-21 | Disposition: A | Discharge status: Home / Self Care | Payer: Medicaid Other | Source: Ambulatory Visit | Attending: Internal Medicine | Admitting: Internal Medicine

## 2021-03-21 VITALS — BP 153/87 | HR 75 | Temp 98.1°F | Resp 18

## 2021-03-21 DIAGNOSIS — U071 COVID-19: Secondary | ICD-10-CM | POA: Diagnosis not present

## 2021-03-21 MED ORDER — MOLNUPIRAVIR EUA 200MG CAPSULE: 4.0000 | ORAL_CAPSULE | Freq: Two times a day (BID) | ORAL | 0 refills | Status: DC

## 2021-03-21 MED ORDER — BENZONATATE 100 MG PO CAPS: 100.0000 mg | ORAL_CAPSULE | Freq: Three times a day (TID) | ORAL | 0 refills | Status: DC | PRN

## 2021-03-21 NOTE — ED Provider Notes (Signed)
[facility]    CSN: [id number] Arrival date & time: 03/21/21  1055      History   Chief Complaint Chief Complaint  Patient presents with   Cough    HPI Catherine Cannon is a 41 y.o. female.   Patient presents with 5-day history of nasal congestion, cough, sore throat.  She had a faint positive COVID-19 test at home and was exposed to COVID at her workplace.  Denies chest pain, shortness of breath, ear pain, nausea, vomiting, diarrhea, abdominal pain.  Patient has taken over-the-counter cold and flu medication with some improvement in symptoms.   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 Authorizing Provider  acetaminophen (TYLENOL) 500 MG tablet Take 500 mg by mouth every 4 (four) hours as needed for mild pain.    [provider]  albuterol (PROVENTIL) (2.5 MG/3ML) 0.083% nebulizer solution Take 3 mLs (2.5 mg total) by nebulization every 6 (six) hours as needed for wheezing or shortness of breath. 12/08/20   Caccavale, Sophia, PA-C  cetirizine (ZYRTEC) 10 MG tablet Take 10 mg by mouth daily as needed for allergies.    [provider]  guaiFENesin-codeine 100-10 MG/5ML syrup Take 5 mLs by mouth every 6 (six) hours as needed for cough. 12/11/20   Caren Griffins, MD  hydrOXYzine (ATARAX/VISTARIL) 25 MG tablet Take 1 tablet (25 mg total) by mouth 3 (three) times daily as needed for anxiety. 12/11/20   Caren Griffins, MD  ipratropium-albuterol (DUONEB) 0.5-2.5 (3) MG/3ML SOLN Take 3 mLs by nebulization every 6 (six) hours as needed (shortness of breath and wheezing). 04/04/20   Scot Jun, FNP  nicotine (NICODERM CQ - DOSED IN MG/24 HOURS) 21 mg/24hr patch Place 1 patch (21 mg total) onto the skin daily. 12/12/20   Caren Griffins, MD  Pseudoeph-Doxylamine-DM-APAP (NYQUIL D COLD/FLU PO) Take 2 capsules by mouth every 6 (six) hours as needed (cold symptoms).    [provider]  Pseudoephedrine-APAP-DM (DAYQUIL MULTI-SYMPTOM COLD/FLU PO) Take 2 capsules by mouth daily as needed (cough/cold).    [provider]    Family History Family History  Problem Relation Age of Onset   Asthma Mother     Social History Social History   Tobacco Use   Smoking status: Every Day    Packs/day: 0.50    Types: Cigarettes  Smokeless tobacco: Never  Vaping Use   Vaping Use: Never used  Substance Use Topics   Alcohol use: Yes    Comment: occ    Drug use: Not Currently    Types: Cocaine    Comment: years ago     Allergies   Patient has no known allergies.   Review of Systems Review of Systems Per HPI  Physical Exam Triage Vital Signs ED Triage Vitals  Enc Vitals Group     BP 03/21/21 1130 (!) 153/87     Pulse Rate 03/21/21 1130 75     Resp 03/21/21 1130 18     Temp 03/21/21 1130 98.1 F (36.7 C)     Temp Source 03/21/21 1130 Oral     SpO2 03/21/21 1130 96 %     Weight --      Height --      Head Circumference --      Peak Flow --      Pain Score 03/21/21 1131 0     Pain Loc --      Pain Edu? --      Excl. in [place]? --    No data found.  Updated Vital Signs BP (!)  153/87 (BP Location: Right Arm)    Pulse 75    Temp 98.1 F (36.7 C) (Oral)    Resp 18    SpO2 96%    Breastfeeding Yes   Visual Acuity Right Eye Distance:   Left Eye Distance:   Bilateral Distance:    Right Eye Near:   Left Eye Near:    Bilateral Near:     Physical Exam Constitutional:      General: She is not in acute distress.    Appearance: Normal appearance. She is not toxic-appearing or diaphoretic.  HENT:     Head: Normocephalic and atraumatic.     Right Ear: Tympanic membrane and ear canal normal.     Left Ear: Tympanic membrane and ear canal normal.     Nose: Congestion present.     Mouth/Throat:     Mouth: Mucous membranes are moist.     Pharynx: No posterior oropharyngeal erythema.  Eyes:     Extraocular Movements: Extraocular movements intact.     Conjunctiva/sclera: Conjunctivae normal.     Pupils: Pupils are equal, round, and reactive to light.  Cardiovascular:     Rate and Rhythm: Normal rate and regular rhythm.     Pulses: Normal pulses.     Heart sounds: Normal heart sounds.  Pulmonary:     Effort: Pulmonary effort is normal. No respiratory distress.     Breath sounds: Normal breath sounds. No stridor. No wheezing, rhonchi or rales.  Abdominal:     General: Abdomen is flat. Bowel sounds are normal.     Palpations: Abdomen is soft.  Musculoskeletal:        General: Normal range of motion.     Cervical back: Normal range of motion.  Skin:    General: Skin is warm and dry.  Neurological:     General: No focal deficit present.     Mental Status: She is alert and oriented to person, place, and time. Mental status is at baseline.  Psychiatric:        Mood and Affect: Mood normal.        Behavior: Behavior normal.     UC Treatments / Results  Labs (all labs ordered are listed, but only abnormal results are displayed) Labs Reviewed  NOVEL  CORONAVIRUS, NAA    EKG   Radiology No results found.  Procedures Procedures (including critical care  time)  Medications Ordered in UC Medications - No data to display  Initial Impression / Assessment and Plan / UC Course  I have reviewed the triage vital signs and the nursing notes.  Pertinent labs & imaging results that were available during my care of the patient were reviewed by me and considered in my medical decision making (see chart for details).    Patient's home COVID test was positive.  Will confirm with COVID-19 PCR test.  Discussed symptomatic treatment with patient.  Patient does not qualify for antiviral medication as she is currently breast-feeding.  Discussed limitations with cold medication with breast-feeding with patient as well.  Patient voiced understanding.  Discussed return precautions.  Patient verbalized understanding and was agreeable with plan. Final Clinical Impressions(s) / UC Diagnoses   Final diagnoses:  [id number]     Discharge Instructions      It is highly likely that you have COVID-19 given your faint positive result at home as well as your exposure.  It appears that you definitely have a viral upper respiratory infection.  You have been prescribed cough medication and COVID-19 antiviral medication.  Please follow-up if symptoms persist or worsen.    ED Prescriptions     Medication Sig Dispense Auth. Provider   molnupiravir EUA (LAGEVRIO) 200 mg CAPS capsule  (Status: Discontinued) Take 4 capsules (800 mg total) by mouth 2 (two) times daily for 5 days. 40 capsule [place], [place] E, [place]   benzonatate (TESSALON) 100 MG capsule  (Status: Discontinued) Take 1 capsule (100 mg total) by mouth every 8 (eight) hours as needed for cough. 21 capsule [place], Michele Rockers, [place]      PDMP not reviewed this encounter.   Teodora Medici, [place] 03/21/21 1226

## 2021-03-21 NOTE — Discharge Instructions (Signed)
It is highly likely that you have COVID-19 given your faint positive result at home as well as your exposure.  It appears that you definitely have a viral upper respiratory infection.  You have been prescribed cough medication and COVID-19 antiviral medication.  Please follow-up if symptoms persist or worsen.

## 2021-03-21 NOTE — ED Triage Notes (Signed)
Pt c/o cough, sore throat, headache, "faint positive covid test at home." Nasal congestion, earache, covid exposure at work   Denies nausea, vomiting, diarrhea, constipation   Onset ~ Monday

## 2021-03-22 LAB — SARS-COV-2, NAA 2 DAY TAT

## 2021-03-22 LAB — NOVEL CORONAVIRUS, NAA: SARS-CoV-2, NAA: NOT DETECTED

## 2021-08-19 ENCOUNTER — Ambulatory Visit
Admission: RE | Admit: 2021-08-19 | Discharge: 2021-08-19 | Disposition: A | Discharge status: Home / Self Care | Payer: Medicaid Other | Source: Ambulatory Visit | Attending: Internal Medicine | Admitting: Internal Medicine

## 2021-08-19 VITALS — BP 148/112 | HR 81 | Temp 98.5°F | Resp 18

## 2021-08-19 DIAGNOSIS — K047 Periapical abscess without sinus: Secondary | ICD-10-CM | POA: Diagnosis not present

## 2021-08-19 DIAGNOSIS — T63301A Toxic effect of unspecified spider venom, accidental (unintentional), initial encounter: Secondary | ICD-10-CM | POA: Diagnosis not present

## 2021-08-19 MED ORDER — CLINDAMYCIN HCL 150 MG PO CAPS: 450.0000 mg | ORAL_CAPSULE | Freq: Three times a day (TID) | ORAL | 0 refills | Status: AC

## 2021-08-20 LAB — CBC
Hematocrit: 49 % — ABNORMAL HIGH (ref 34.0–46.6)
Hemoglobin: 16.4 g/dL — ABNORMAL HIGH (ref 11.1–15.9)
MCH: 31.7 pg (ref 26.6–33.0)
MCHC: 33.5 g/dL (ref 31.5–35.7)
MCV: 95 fL (ref 79–97)
Platelets: 240 10*3/uL (ref 150–450)
RBC: 5.17 x10E6/uL (ref 3.77–5.28)
RDW: 12.9 % (ref 11.7–15.4)
WBC: 9.9 10*3/uL (ref 3.4–10.8)

## 2021-08-20 LAB — COMPREHENSIVE METABOLIC PANEL
ALT: 90 IU/L — ABNORMAL HIGH (ref 0–32)
AST: 86 IU/L — ABNORMAL HIGH (ref 0–40)
Albumin/Globulin Ratio: 1.5 (ref 1.2–2.2)
Albumin: 4.1 g/dL (ref 3.8–4.8)
Alkaline Phosphatase: 106 IU/L (ref 44–121)
BUN/Creatinine Ratio: 12 (ref 9–23)
BUN: 6 mg/dL (ref 6–24)
Bilirubin Total: 0.4 mg/dL (ref 0.0–1.2)
CO2: 22 mmol/L (ref 20–29)
Calcium: 9.4 mg/dL (ref 8.7–10.2)
Chloride: 102 mmol/L (ref 96–106)
Creatinine, Ser: 0.49 mg/dL — ABNORMAL LOW (ref 0.57–1.00)
Globulin, Total: 2.7 g/dL (ref 1.5–4.5)
Glucose: 88 mg/dL (ref 70–99)
Potassium: 4.2 mmol/L (ref 3.5–5.2)
Sodium: 138 mmol/L (ref 134–144)
Total Protein: 6.8 g/dL (ref 6.0–8.5)
eGFR: 121 mL/min/{1.73_m2} (ref >59)

## 2021-08-20 LAB — CK: Total CK: 109 U/L (ref 32–182)

## 2021-10-21 ENCOUNTER — Other Ambulatory Visit: Payer: Self-pay

## 2021-10-21 ENCOUNTER — Encounter (HOSPITAL_BASED_OUTPATIENT_CLINIC_OR_DEPARTMENT_OTHER): Payer: Self-pay | Admitting: Emergency Medicine

## 2021-10-21 DIAGNOSIS — L03115 Cellulitis of right lower limb: Secondary | ICD-10-CM | POA: Insufficient documentation

## 2021-10-21 DIAGNOSIS — M25471 Effusion, right ankle: Secondary | ICD-10-CM | POA: Diagnosis present

## 2021-10-21 NOTE — ED Triage Notes (Signed)
Patient arrived via POV c/o right ankle swelling x 1 day. Patient states worse swelling during day. Patient states noticing rash on both ankles. Patient states 8/10 pain. Patient is ao x 4, VS WDL, slow gait.

## 2021-10-22 ENCOUNTER — Emergency Department (HOSPITAL_BASED_OUTPATIENT_CLINIC_OR_DEPARTMENT_OTHER)
Admission: EM | Admit: 2021-10-22 | Discharge: 2021-10-22 | Disposition: A | Discharge status: Home / Self Care | Payer: Medicaid Other | Attending: Emergency Medicine | Admitting: Emergency Medicine

## 2021-10-22 DIAGNOSIS — L03119 Cellulitis of unspecified part of limb: Secondary | ICD-10-CM

## 2021-10-22 MED ORDER — DOXYCYCLINE HYCLATE 100 MG PO CAPS: 100.0000 mg | ORAL_CAPSULE | Freq: Two times a day (BID) | ORAL | 0 refills | Status: DC

## 2021-10-22 MED ORDER — DOXYCYCLINE HYCLATE 100 MG PO TABS
100.0000 mg | ORAL_TABLET | Freq: Once | ORAL | Status: AC
  Administered 2021-10-22: 100 mg via ORAL
  Filled 2021-10-22: qty 1

## 2021-10-22 NOTE — ED Provider Notes (Signed)
[facility]  Provider Note  CSN: [id number] Arrival date & time: 10/21/21 2126  History Chief Complaint  Patient presents with   Joint Swelling    Catherine Cannon is a 41 y.o. female reports she noticed a sore on her right ankle 3 days ago that has gotten progressively large, associated with a burning pain and some surrounding redness and swelling. No drainage. No known bites but she also noted a similar lesion appeared on her L ankle yesterday which has also been getting larger. No fevers.    Home Medications Prior to Admission medications   Medication Sig Start Date End Date Taking? Authorizing Provider  doxycycline (VIBRAMYCIN) 100 MG capsule Take 1 capsule (100 mg total) by mouth 2 (two) times daily. 10/22/21  Yes Truddie Hidden, MD  acetaminophen (TYLENOL) 500 MG tablet Take 500 mg by mouth every 4 (four) hours as needed for mild pain.    [provider]  albuterol (PROVENTIL) (2.5 MG/3ML) 0.083% nebulizer solution Take 3 mLs (2.5 mg total) by nebulization every 6 (six) hours as needed for wheezing or shortness of breath. 12/08/20   Caccavale, Sophia, PA-C  cetirizine (ZYRTEC) 10 MG tablet Take 10 mg by mouth daily as needed for allergies.    [provider]  ipratropium-albuterol (DUONEB) 0.5-2.5 (3) MG/3ML SOLN Take 3 mLs by nebulization every 6 (six) hours as needed (shortness of breath and wheezing). 04/04/20   Scot Jun, FNP  Pseudoeph-Doxylamine-DM-APAP (NYQUIL D COLD/FLU PO) Take 2 capsules by mouth every 6 (six) hours as needed (cold symptoms).    [provider]  Pseudoephedrine-APAP-DM (DAYQUIL MULTI-SYMPTOM COLD/FLU PO) Take 2 capsules by mouth daily as needed (cough/cold).    [provider]     Allergies    Patient has no known allergies.   Review of Systems   Review of Systems Please see HPI for pertinent positives and negatives  Physical Exam BP (!) 158/75 (BP Location: Left Arm)    Pulse 86   Temp 98.4 F (36.9 C) (Oral)   Resp 18   Ht '5\' 8"'$  (1.727 m)   Wt 114.3 kg   LMP 09/19/2021 (Approximate)   SpO2 95%   BMI 38.32 kg/m   Physical Exam Vitals and nursing note reviewed.  HENT:     Head: Normocephalic.     Nose: Nose normal.  Eyes:     Extraocular Movements: Extraocular movements intact.  Pulmonary:     Effort: Pulmonary effort is normal.  Musculoskeletal:        General: Normal range of motion.     Cervical back: Neck supple.  Skin:    Findings: Rash (multiple shallow ulcerated lesions on ankles of varying sizes, mild surrounding erythema and induration) present.  Neurological:     Mental Status: She is alert and oriented to person, place, and time.  Psychiatric:        Mood and Affect: Mood normal.      ED Results / Procedures / Treatments   EKG None  Procedures Procedures  Medications Ordered in the ED Medications  doxycycline (VIBRA-TABS) tablet 100 mg (has no administration in time range)    Initial Impression and Plan  Unclear etiology of rash but some early signs of infection. Will begin Doxycycline. Advised to follow up with PCP or RTED for worsening.   ED Course       MDM Rules/Calculators/A&P Medical Decision Making Problems Addressed: Cellulitis of ankle: acute illness or injury  Risk Prescription drug management.  Final Clinical Impression(s) / ED Diagnoses Final diagnoses:  Cellulitis of ankle    Rx / DC Orders ED Discharge Orders          Ordered    doxycycline (VIBRAMYCIN) 100 MG capsule  2 times daily        10/22/21 0109             Truddie Hidden, MD 10/22/21 [phone]

## 2021-11-16 ENCOUNTER — Ambulatory Visit
Admission: EM | Admit: 2021-11-16 | Discharge: 2021-11-16 | Disposition: A | Discharge status: Home / Self Care | Payer: Medicaid Other | Attending: Internal Medicine | Admitting: Internal Medicine

## 2021-11-16 ENCOUNTER — Encounter: Payer: Self-pay | Admitting: Emergency Medicine

## 2021-11-16 DIAGNOSIS — J069 Acute upper respiratory infection, unspecified: Secondary | ICD-10-CM | POA: Insufficient documentation

## 2021-11-16 DIAGNOSIS — Z20822 Contact with and (suspected) exposure to covid-19: Secondary | ICD-10-CM | POA: Insufficient documentation

## 2021-11-16 DIAGNOSIS — R062 Wheezing: Secondary | ICD-10-CM | POA: Insufficient documentation

## 2021-11-16 LAB — SARS CORONAVIRUS 2 (TAT 6-24 HRS): SARS Coronavirus 2: NEGATIVE

## 2021-11-16 MED ORDER — ALBUTEROL SULFATE HFA 108 (90 BASE) MCG/ACT IN AERS: 1.0000 | INHALATION_SPRAY | Freq: Four times a day (QID) | RESPIRATORY_TRACT | 0 refills | Status: DC | PRN

## 2021-11-16 MED ORDER — METHYLPREDNISOLONE SODIUM SUCC 125 MG IJ SOLR
80.0000 mg | Freq: Once | INTRAMUSCULAR | Status: AC
  Administered 2021-11-16: 80 mg via INTRAMUSCULAR

## 2021-11-16 MED ORDER — ALBUTEROL SULFATE (2.5 MG/3ML) 0.083% IN NEBU
2.5000 mg | INHALATION_SOLUTION | Freq: Once | RESPIRATORY_TRACT | Status: AC
  Administered 2021-11-16: 2.5 mg via RESPIRATORY_TRACT

## 2021-11-16 MED ORDER — PREDNISONE 20 MG PO TABS: 40.0000 mg | ORAL_TABLET | Freq: Every day | ORAL | 0 refills | Status: AC

## 2021-11-16 MED ORDER — BENZONATATE 100 MG PO CAPS: 100.0000 mg | ORAL_CAPSULE | Freq: Three times a day (TID) | ORAL | 0 refills | Status: DC | PRN

## 2021-11-16 NOTE — ED Provider Notes (Signed)
[facility]    CSN: [id number] Arrival date & time: 11/16/21  3846      History   Chief Complaint Chief Complaint  Patient presents with   Cough    Chest congestion    HPI Catherine Cannon is a 41 y.o. female.   Patient presents with cough, nasal congestion, feelings of chest congestion, sinus pressure, intermittent shortness of breath that started about 3 days ago.  Patient denies history of asthma or COPD but reports that she has had to use an inhaler previously.  She does not currently have an inhaler at home so she has not used it for this acute illness.  Denies any known fevers.  Reports that her family has had similar symptoms.  Denies chest pain, sore throat, ear pain, nausea, vomiting, diarrhea, abdominal pain.   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 Authorizing Provider  albuterol (VENTOLIN HFA) 108 (90 Base) MCG/ACT inhaler Inhale 1-2 puffs into the lungs every 6 (six) hours as needed for wheezing or shortness of breath. 11/16/21  Yes Jewelle Whitner, Hildred Alamin E, FNP  benzonatate (TESSALON) 100 MG capsule Take 1 capsule (100 mg total) by  mouth every 8 (eight) hours as needed for cough. 11/16/21  Yes Tenaya Hilyer, Hildred Alamin E, FNP  predniSONE (DELTASONE) 20 MG tablet Take 2 tablets (40 mg total) by mouth daily for 5 days. 11/16/21 11/21/21 Yes Tarika Mckethan, Michele Rockers, FNP  acetaminophen (TYLENOL) 500 MG tablet Take 500 mg by mouth every 4 (four) hours as needed for mild pain.    [provider]  albuterol (PROVENTIL) (2.5 MG/3ML) 0.083% nebulizer solution Take 3 mLs (2.5 mg total) by nebulization every 6 (six) hours as needed for wheezing or shortness of breath. 12/08/20   Caccavale, Sophia, PA-C  ipratropium-albuterol (DUONEB) 0.5-2.5 (3) MG/3ML SOLN Take 3 mLs by nebulization every 6 (six) hours as needed (shortness of breath and wheezing). 04/04/20   Scot Jun, FNP  Pseudoeph-Doxylamine-DM-APAP (NYQUIL D COLD/FLU PO) Take 2 capsules by mouth every 6 (six) hours as needed (cold symptoms).    [provider]  Pseudoephedrine-APAP-DM (DAYQUIL MULTI-SYMPTOM COLD/FLU PO) Take 2 capsules by mouth daily as needed (cough/cold).    [provider]    Family History Family History  Problem Relation Age of Onset   Asthma Mother     Social History Social History  Tobacco Use   Smoking status: Every Day    Packs/day: 0.50    Types: Cigarettes   Smokeless tobacco: Never  Vaping Use   Vaping Use: Never used  Substance Use Topics   Alcohol use: Yes    Comment: occ    Drug use: Not Currently    Types: Cocaine    Comment: years ago     Allergies   Patient has no known allergies.   Review of Systems Review of Systems Per HPI  Physical Exam Triage Vital Signs ED Triage Vitals  Enc Vitals Group     BP 11/16/21 0934 (!) 150/88     Pulse Rate 11/16/21 0934 79     Resp 11/16/21 0934 19     Temp 11/16/21 0934 97.8 F (36.6 C)     Temp src --      SpO2 11/16/21 0934 94 %     Weight --      Height --      Head Circumference --      Peak Flow --      Pain Score 11/16/21 0937 7     Pain Loc --      Pain  Edu? --      Excl. in [place]? --    No data found.  Updated Vital Signs BP (!) 150/88   Pulse 79   Temp 97.8 F (36.6 C)   Resp 19   LMP 10/13/2021 (Approximate)   SpO2 95%   Visual Acuity Right Eye Distance:   Left Eye Distance:   Bilateral Distance:    Right Eye Near:   Left Eye Near:    Bilateral Near:     Physical Exam Constitutional:      General: She is not in acute distress.    Appearance: Normal appearance. She is not toxic-appearing or diaphoretic.  HENT:     Head: Normocephalic and atraumatic.     Right Ear: Tympanic membrane and ear canal normal.     Left Ear: Tympanic membrane and ear canal normal.     Nose: Congestion present.     Mouth/Throat:     Mouth: Mucous membranes are moist.     Pharynx: No posterior oropharyngeal erythema.  Eyes:     Extraocular Movements: Extraocular movements intact.     Conjunctiva/sclera: Conjunctivae normal.     Pupils: Pupils are equal, round, and reactive to light.  Cardiovascular:     Rate and Rhythm: Normal rate and regular rhythm.     Pulses: Normal pulses.     Heart sounds: Normal heart sounds.  Pulmonary:     Effort: Pulmonary effort is normal. No respiratory distress.     Breath sounds: No stridor. Wheezing present. No rhonchi or rales.     Comments: Mild wheezing noted on exam.  Abdominal:     General: Abdomen is flat. Bowel sounds are normal.     Palpations: Abdomen is soft.  Musculoskeletal:        General: Normal range of motion.     Cervical back: Normal range of motion.  Skin:    General: Skin is warm and dry.  Neurological:     General: No focal deficit present.     Mental Status: She is alert and oriented to person, place, and time. Mental status is at baseline.  Psychiatric:        Mood and Affect: Mood normal.        Behavior: Behavior normal.      UC Treatments /  Results  Labs (all labs ordered are listed, but only abnormal results are displayed) Labs Reviewed  SARS CORONAVIRUS 2 (TAT 6-24  HRS)    EKG   Radiology No results found.  Procedures Procedures (including critical care time)  Medications Ordered in UC Medications  albuterol (PROVENTIL) (2.5 MG/3ML) 0.083% nebulizer solution 2.5 mg (2.5 mg Nebulization Given 11/16/21 0955)  methylPREDNISolone sodium succinate (SOLU-MEDROL) 125 mg/2 mL injection 80 mg (80 mg Intramuscular Given 11/16/21 1026)    Initial Impression / Assessment and Plan / UC Course  I have reviewed the triage vital signs and the nursing notes.  Pertinent labs & imaging results that were available during my care of the patient were reviewed by me and considered in my medical decision making (see chart for details).     Patient presents with symptoms likely from a viral upper respiratory infection. Differential includes bacterial pneumonia, sinusitis, allergic rhinitis, COVID-19, flu and RSV.  Patient is nontoxic appearing and not in need of emergent medical intervention.  COVID test is pending.  Albuterol nebulizer treatment administered in urgent care with improvement in lung sounds and patient stated that she felt better.  Oxygen also improved to 95%.  Do not think chest imaging is necessary given patient is not tachypneic and not in respiratory distress.  Suspect asthma exacerbation due to acute viral illness.  Recommended symptom control with over the counter medications.  IM steroid administered in urgent care.  Will prescribe prednisone for patient to start taking tomorrow as well.  Patient has taken prednisone before and tolerated well.  No obvious contraindication to prednisone noted in patient's history.  Patient also has an albuterol inhaler and other supportive medications.  Patient is safe for discharge as oxygen is stable and patient is not in any respiratory distress.  Advised to monitor oxygen at home with her home pulse oximeter.  Return if symptoms fail to improve.  Patient given strict return and ER precautions. Patient states  understanding and is agreeable.   Final Clinical Impressions(s) / UC Diagnoses   Final diagnoses:  Viral upper respiratory tract infection with cough  Wheezing     Discharge Instructions      It appears that you have a viral upper respiratory infection that is causing inflammation in chest and wheezing.  You were given a nebulizer treatment in urgent care today.  I have also prescribed you an albuterol inhaler to take at home.  Please keep in mind that nebulizer medication and albuterol inhaler are the same medication so please use at least 4 to 6 hours apart.  You are given a steroid injection today in urgent care as well.  I have prescribed you prednisone that you will start taking tomorrow.  Please monitor oxygen at home with your pulse oximeter as well.  Cough medication has been prescribed for you.  COVID test is pending.  We will call if it is positive.  Please go to the emergency department if symptoms persist or worsen.    ED Prescriptions     Medication Sig Dispense Auth. Provider   albuterol (VENTOLIN HFA) 108 (90 Base) MCG/ACT inhaler Inhale 1-2 puffs into the lungs every 6 (six) hours as needed for wheezing or shortness of breath. 1 each [place], [place]orene E, [place]   benzonatate (TESSALON) 100 MG capsule Take 1 capsule (100 mg total) by mouth every 8 (eight) hours as needed for cough. 21 capsule [place], [place] E, [place]   predniSONE (DELTASONE) 20 MG tablet Take 2 tablets (40 mg  total) by mouth daily for 5 days. 10 tablet Teodora Medici, [place]      PDMP not reviewed this encounter.   Teodora Medici,  11/16/21 1133

## 2021-11-16 NOTE — Discharge Instructions (Signed)
It appears that you have a viral upper respiratory infection that is causing inflammation in chest and wheezing.  You were given a nebulizer treatment in urgent care today.  I have also prescribed you an albuterol inhaler to take at home.  Please keep in mind that nebulizer medication and albuterol inhaler are the same medication so please use at least 4 to 6 hours apart.  You are given a steroid injection today in urgent care as well.  I have prescribed you prednisone that you will start taking tomorrow.  Please monitor oxygen at home with your pulse oximeter as well.  Cough medication has been prescribed for you.  COVID test is pending.  We will call if it is positive.  Please go to the emergency department if symptoms persist or worsen.

## 2021-11-16 NOTE — ED Triage Notes (Signed)
Pt is present today with c/o cough, chest congestion, and sinus pressure. Pt sx started Friday

## 2022-07-07 ENCOUNTER — Ambulatory Visit
Admission: RE | Admit: 2022-07-07 | Discharge: 2022-07-07 | Disposition: A | Discharge status: Home / Self Care | Payer: Medicaid Other | Source: Ambulatory Visit | Attending: Emergency Medicine | Admitting: Emergency Medicine

## 2022-07-07 VITALS — BP 134/75 | HR 90 | Temp 98.3°F | Resp 18

## 2022-07-07 DIAGNOSIS — J45901 Unspecified asthma with (acute) exacerbation: Secondary | ICD-10-CM | POA: Diagnosis not present

## 2022-07-07 DIAGNOSIS — F17210 Nicotine dependence, cigarettes, uncomplicated: Secondary | ICD-10-CM | POA: Diagnosis not present

## 2022-07-07 DIAGNOSIS — J069 Acute upper respiratory infection, unspecified: Secondary | ICD-10-CM | POA: Diagnosis not present

## 2022-07-07 DIAGNOSIS — R519 Headache, unspecified: Secondary | ICD-10-CM | POA: Diagnosis present

## 2022-07-07 DIAGNOSIS — Z1152 Encounter for screening for COVID-19: Secondary | ICD-10-CM | POA: Insufficient documentation

## 2022-07-07 MED ORDER — ALBUTEROL SULFATE (2.5 MG/3ML) 0.083% IN NEBU
2.5000 mg | INHALATION_SOLUTION | Freq: Once | RESPIRATORY_TRACT | Status: AC
  Administered 2022-07-07: 2.5 mg via RESPIRATORY_TRACT

## 2022-07-07 MED ORDER — AZITHROMYCIN 250 MG PO TABS: ORAL_TABLET | ORAL | 0 refills | Status: DC

## 2022-07-07 MED ORDER — PREDNISONE 10 MG PO TABS: 20.0000 mg | ORAL_TABLET | Freq: Every day | ORAL | 0 refills | Status: AC

## 2022-07-07 MED ORDER — ALBUTEROL SULFATE HFA 108 (90 BASE) MCG/ACT IN AERS: 1.0000 | INHALATION_SPRAY | Freq: Four times a day (QID) | RESPIRATORY_TRACT | 0 refills | Status: DC | PRN

## 2022-07-07 NOTE — ED Provider Notes (Signed)
[facility]    CSN: [id number] Arrival date & time: 07/07/22  1333      History   Chief Complaint Chief Complaint  Patient presents with   Headache    Entered by patient   Cough   Fatigue    HPI Catherine Cannon is a 42 y.o. female. She reports 2 weeks of chest congestion and cough. SOB and wheezing. Occasionally coughs up white sputum. Also developed headache and nasal congestion a few days ago. Used albuterol nebulizer at home earlier in illness and it helped SOB and wheeze but hasn't used it lately - hasn't felt like she had time. Also taking mucinex and benadryl- she thinks the mucinex helps a lot.    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 Authorizing Provider  azithromycin (ZITHROMAX Z-PAK) 250 MG tablet Take 2 tabs the first day, then 1 tab per day on days 2-5 07/07/22  Yes Lisanne Ponce, Marzella Schlein, NP  predniSONE (DELTASONE) 10 MG tablet Take 2 tablets (20 mg total) by mouth daily for 5 days. 07/07/22 07/12/22 Yes Cathlyn Parsons, NP  acetaminophen (TYLENOL) 500 MG  tablet Take 500 mg by mouth every 4 (four) hours as needed for mild pain.    [provider]  albuterol (PROVENTIL) (2.5 MG/3ML) 0.083% nebulizer solution Take 3 mLs (2.5 mg total) by nebulization every 6 (six) hours as needed for wheezing or shortness of breath. 12/08/20   Caccavale, Sophia, PA-C  albuterol (VENTOLIN HFA) 108 (90 Base) MCG/ACT inhaler Inhale 1-2 puffs into the lungs every 6 (six) hours as needed for wheezing or shortness of breath. 07/07/22   Cathlyn Parsons, NP  benzonatate (TESSALON) 100 MG capsule Take 1 capsule (100 mg total) by mouth every 8 (eight) hours as needed for cough. 11/16/21   Gustavus Bryant, FNP  ipratropium-albuterol (DUONEB) 0.5-2.5 (3) MG/3ML SOLN Take 3 mLs by nebulization every 6 (six) hours as needed (shortness of breath and wheezing). 04/04/20   Bing Neighbors, NP  Pseudoeph-Doxylamine-DM-APAP (NYQUIL D COLD/FLU PO) Take 2 capsules by mouth every 6 (six) hours as needed (cold symptoms).    [provider]  Pseudoephedrine-APAP-DM (DAYQUIL MULTI-SYMPTOM COLD/FLU PO) Take 2 capsules by mouth daily as needed (cough/cold).    [provider]    [facility]  History Family History  Problem Relation Age of Onset   Asthma Mother     Social History Social History   Tobacco Use   Smoking status: Every Day    Packs/day: .5    Types: Cigarettes   Smokeless tobacco: Never  Vaping Use   Vaping Use: Never used  Substance Use Topics   Alcohol use: Yes    Comment: occ    Drug use: Not Currently    Types: Cocaine    Comment: years ago     Allergies   Patient has no known allergies.   Review of Systems Review of Systems  Respiratory:  Positive for cough.   Neurological:  Positive for headaches.     Physical Exam Triage Vital Signs ED Triage Vitals [07/07/22 1431]  Enc Vitals Group     BP 134/75     Pulse Rate 90     Resp 18     Temp 98.3 F (36.8 C)     Temp src      SpO2 98 %     Weight      Height      Head  Circumference      Peak Flow      Pain Score      Pain Loc      Pain Edu?      Excl. in GC?    No data found.  Updated Vital Signs BP 134/75 (BP Location: Left Arm)   Pulse 90   Temp 98.3 F (36.8 C)   Resp 18   LMP 06/24/2022 (Approximate)   SpO2 98%   Visual Acuity Right Eye Distance:   Left Eye Distance:   Bilateral Distance:    Right Eye Near:   Left Eye Near:    Bilateral Near:     Physical Exam Constitutional:      Appearance: She is well-developed.  HENT:     Right Ear: Tympanic membrane, ear canal and external ear normal.     Left Ear: Tympanic membrane, ear canal and external ear normal.     Nose: Congestion present.     Mouth/Throat:     Mouth: Mucous membranes are moist.     Pharynx: Oropharynx is clear. No oropharyngeal exudate or posterior oropharyngeal erythema.  Cardiovascular:     Rate and Rhythm: Normal rate and regular rhythm.  Pulmonary:     Effort: Pulmonary effort is normal.     Breath sounds: Wheezing and rhonchi present.  Neurological:     Mental Status: She is alert.      UC Treatments / Results  Labs (all labs ordered are listed, but only abnormal results are displayed) Labs Reviewed  SARS CORONAVIRUS 2 (TAT 6-24 HRS)    EKG   Radiology No results found.  Procedures Procedures (including critical care time)  Medications Ordered in UC Medications  albuterol (PROVENTIL) (2.5 MG/3ML) 0.083% nebulizer solution 2.5 mg (2.5 mg Nebulization Given 07/07/22 1505)    Initial Impression / Assessment and Plan / UC Course  I have reviewed the triage vital signs and the nursing notes.  Pertinent labs & imaging results that were available during my care of the patient were reviewed by me and considered in my medical decision making (see chart for details).    Lungs CTA B after albuterol nebulizer treatment. Covid test results pending. As pt has been sick for 2 weeks, will treat for possibly CAP. Pt has a spacer at home she can use;  rx albuterol inhaler.  Final Clinical Impressions(s) / UC Diagnoses   Final diagnoses:  Upper respiratory tract infection, unspecified type  Asthma with acute exacerbation, unspecified asthma severity, unspecified whether persistent     Discharge Instructions      Use the albuterol inhaler with the spacer every 4-6 hours if needed for shortness of breath or wheezing.   You will get a call if test is positive, you will not get a call if test is negative but you can check results in MyChart if you have a MyChart account.     ED Prescriptions     Medication Sig Dispense Auth. Provider   albuterol (VENTOLIN HFA) 108 (90 Base) MCG/ACT inhaler Inhale 1-2 puffs into the lungs every 6 (six) hours as needed for wheezing or shortness of breath. 1 each Cathlyn Parsons, NP   azithromycin (ZITHROMAX Z-PAK) 250 MG tablet Take 2 tabs the first day, then 1 tab per day on days 2-5 6 tablet Izen Petz, Marzella Schlein, NP   predniSONE (DELTASONE) 10 MG tablet Take 2 tablets (20 mg total) by mouth daily for 5 days. 10 tablet Cathlyn Parsons, NP      PDMP not reviewed this encounter.   Cathlyn Parsons, NP 07/07/22 1526

## 2022-07-07 NOTE — ED Triage Notes (Signed)
Here for cough,sore throat and fatigue for several days.

## 2022-07-07 NOTE — Discharge Instructions (Signed)
Use the albuterol inhaler with the spacer every 4-6 hours if needed for shortness of breath or wheezing.   You will get a call if test is positive, you will not get a call if test is negative but you can check results in MyChart if you have a MyChart account.

## 2022-07-08 LAB — SARS CORONAVIRUS 2 (TAT 6-24 HRS): SARS Coronavirus 2: NEGATIVE

## 2022-08-06 ENCOUNTER — Encounter (HOSPITAL_COMMUNITY): Payer: Self-pay

## 2022-08-06 ENCOUNTER — Emergency Department (HOSPITAL_COMMUNITY)
Admission: EM | Admit: 2022-08-06 | Discharge: 2022-08-06 | Disposition: A | Discharge status: Home / Self Care | Payer: Medicaid Other | Attending: Emergency Medicine | Admitting: Emergency Medicine

## 2022-08-06 ENCOUNTER — Emergency Department (HOSPITAL_COMMUNITY): Payer: Medicaid Other

## 2022-08-06 DIAGNOSIS — R600 Localized edema: Secondary | ICD-10-CM | POA: Diagnosis not present

## 2022-08-06 DIAGNOSIS — R0789 Other chest pain: Secondary | ICD-10-CM | POA: Diagnosis not present

## 2022-08-06 DIAGNOSIS — R051 Acute cough: Secondary | ICD-10-CM | POA: Insufficient documentation

## 2022-08-06 DIAGNOSIS — R059 Cough, unspecified: Secondary | ICD-10-CM | POA: Diagnosis present

## 2022-08-06 DIAGNOSIS — R0602 Shortness of breath: Secondary | ICD-10-CM | POA: Insufficient documentation

## 2022-08-06 DIAGNOSIS — F17200 Nicotine dependence, unspecified, uncomplicated: Secondary | ICD-10-CM | POA: Diagnosis not present

## 2022-08-06 LAB — BASIC METABOLIC PANEL
Anion gap: 12 (ref 5–15)
BUN: 7 mg/dL (ref 6–20)
CO2: 22 mmol/L (ref 22–32)
Calcium: 9.1 mg/dL (ref 8.9–10.3)
Chloride: 101 mmol/L (ref 98–111)
Creatinine, Ser: 0.58 mg/dL (ref 0.44–1.00)
GFR, Estimated: >60 mL/min (ref >60)
Glucose, Bld: 174 mg/dL — ABNORMAL HIGH (ref 70–99)
Potassium: 3.4 mmol/L — ABNORMAL LOW (ref 3.5–5.1)
Sodium: 135 mmol/L (ref 135–145)

## 2022-08-06 LAB — CBC WITH DIFFERENTIAL/PLATELET
Abs Immature Granulocytes: 0.05 10*3/uL (ref 0.00–0.07)
Basophils Absolute: 0.1 10*3/uL (ref 0.0–0.1)
Basophils Relative: 1 %
Eosinophils Absolute: 0.4 10*3/uL (ref 0.0–0.5)
Eosinophils Relative: 4 %
HCT: 49.3 % — ABNORMAL HIGH (ref 36.0–46.0)
Hemoglobin: 16.2 g/dL — ABNORMAL HIGH (ref 12.0–15.0)
Immature Granulocytes: 1 %
Lymphocytes Relative: 30 %
Lymphs Abs: 3.3 10*3/uL (ref 0.7–4.0)
MCH: 32.8 pg (ref 26.0–34.0)
MCHC: 32.9 g/dL (ref 30.0–36.0)
MCV: 99.8 fL (ref 80.0–100.0)
Monocytes Absolute: 0.6 10*3/uL (ref 0.1–1.0)
Monocytes Relative: 5 %
Neutro Abs: 6.5 10*3/uL (ref 1.7–7.7)
Neutrophils Relative %: 59 %
Platelets: 191 10*3/uL (ref 150–400)
RBC: 4.94 MIL/uL (ref 3.87–5.11)
RDW: 14.4 % (ref 11.5–15.5)
WBC: 11 10*3/uL — ABNORMAL HIGH (ref 4.0–10.5)
nRBC: 0 % (ref 0.0–0.2)

## 2022-08-06 LAB — BRAIN NATRIURETIC PEPTIDE: B Natriuretic Peptide: 210.8 pg/mL — ABNORMAL HIGH (ref 0.0–100.0)

## 2022-08-06 LAB — POC URINE PREG, ED: Preg Test, Ur: NEGATIVE

## 2022-08-06 LAB — D-DIMER, QUANTITATIVE: D-Dimer, Quant: 0.52 ug/mL-FEU — ABNORMAL HIGH (ref 0.00–0.50)

## 2022-08-06 MED ORDER — SODIUM CHLORIDE (PF) 0.9 % IJ SOLN
INTRAMUSCULAR | Status: AC
  Filled 2022-08-06: qty 50

## 2022-08-06 MED ORDER — FUROSEMIDE 20 MG PO TABS: 20.0000 mg | ORAL_TABLET | Freq: Every day | ORAL | 0 refills | Status: DC

## 2022-08-06 MED ORDER — IOHEXOL 350 MG/ML SOLN
80.0000 mL | Freq: Once | INTRAVENOUS | Status: AC | PRN
  Administered 2022-08-06: 80 mL via INTRAVENOUS

## 2022-08-06 MED ORDER — FUROSEMIDE 10 MG/ML IJ SOLN
40.0000 mg | Freq: Once | INTRAMUSCULAR | Status: AC
  Administered 2022-08-06: 40 mg via INTRAVENOUS
  Filled 2022-08-06: qty 4

## 2022-08-06 MED ORDER — BENZONATATE 100 MG PO CAPS: 100.0000 mg | ORAL_CAPSULE | Freq: Three times a day (TID) | ORAL | 0 refills | Status: DC

## 2022-08-06 NOTE — Discharge Instructions (Signed)
  You have been evaluated for your symptoms.  Fortunately CT scan today did not show any evidence of pneumonia or blood clot in your lungs.  You do have evidence of fluid retention that would be best evaluated further outpatient.  Cardiology office will give you a call as you may benefit from an echocardiogram which is an ultrasound of your heart.  Please take the fluid pill for the next 3 days and follow-up with your doctor for further care.  Take Tessalon as needed for cough.

## 2022-08-06 NOTE — ED Triage Notes (Signed)
Pt presents with c/o cough. Pt reports her oxygen has been dropping at night as she has been checking it with a pulse ox at home. Pt reports it is usally 91-94% if she is sitting up and under 90% when she is laying down.

## 2022-08-06 NOTE — ED Provider Notes (Signed)
[place] EMERGENCY DEPARTMENT AT [facility] Provider Note   CSN: [id number] Arrival date & time: 08/06/22  8295     History  Chief Complaint  Patient presents with   Cough    Catherine Cannon is a 42 y.o. female.  The history is provided by the patient and medical records. No language interpreter was used.  Cough    42 year old for female with history of tobacco use, alcohol use, obesity, prior history of multifocal pneumonia presenting with complaints of cough and shortness of breath.  Patient report for the past week she has had a nonproductive cough but for the past 2 days she now noticing increased shortness of breath.  Shortness of breath is more noticeable when she lays flat and now she has to sleep in a recliner.  She also noticed that her legs are more swollen than usual.  She does not endorse any fever or chills.  She does endorse some chest discomfort when she coughs but not coughing up any blood.  No nausea vomiting or diarrhea.  No history of PE or DVT no history of CHF.  Home Medications Prior to Admission medications   Medication Sig Start Date End Date Taking? Authorizing Provider  acetaminophen (TYLENOL) 500 MG tablet Take 500 mg by mouth every 4 (four) hours as needed for mild pain.    [provider]  albuterol (PROVENTIL) (2.5 MG/3ML) 0.083% nebulizer solution Take 3 mLs (2.5 mg total) by nebulization every 6 (six) hours as needed for wheezing or shortness of breath. 12/08/20   Caccavale, Sophia, PA-C  albuterol (VENTOLIN HFA) 108 (90 Base) MCG/ACT inhaler Inhale 1-2 puffs into the lungs every 6 (six) hours as needed for wheezing or shortness of breath. 07/07/22   Cathlyn Parsons, NP  azithromycin (ZITHROMAX Z-PAK) 250 MG tablet Take 2 tabs the first day, then 1 tab per day on days 2-5 07/07/22   Cathlyn Parsons, NP  benzonatate (TESSALON) 100 MG capsule Take 1 capsule (100 mg total) by mouth every 8 (eight) hours as needed for cough. 11/16/21   Gustavus Bryant, FNP  ipratropium-albuterol (DUONEB) 0.5-2.5 (3) MG/3ML SOLN Take 3 mLs by nebulization every 6 (six) hours as needed (shortness of breath and wheezing). 04/04/20   Bing Neighbors, NP  Pseudoeph-Doxylamine-DM-APAP (NYQUIL D COLD/FLU PO) Take 2 capsules by mouth every 6 (six) hours as needed (cold symptoms).    [provider]  Pseudoephedrine-APAP-DM (DAYQUIL MULTI-SYMPTOM COLD/FLU PO) Take 2 capsules by mouth daily as needed (cough/cold).    [provider]      Allergies    Patient has no known allergies.    Review of Systems   Review of Systems  Respiratory:  Positive for cough.   All other systems reviewed and are negative.   Physical Exam Updated Vital Signs BP (!) 145/90 (BP Location: Left Arm)   Pulse 91   Temp 98.4 F (36.9 C) (Oral)   Resp 18   LMP 06/24/2022 (Approximate)   SpO2 95%  Physical Exam Vitals and nursing note reviewed.  Constitutional:      General: She is not in acute distress.    Appearance: She is well-developed. She is obese.  HENT:     Head: Atraumatic.  Eyes:     Conjunctiva/sclera: Conjunctivae normal.  Cardiovascular:     Rate and Rhythm: Normal rate and regular rhythm.  Pulmonary:     Effort: Pulmonary effort is normal.     Breath sounds: Rales present.  Abdominal:     Palpations: Abdomen is soft.     Tenderness: There is no abdominal tenderness.  Musculoskeletal:     Cervical back: Neck supple.     Right lower leg: Edema present.     Left lower leg: Edema present.  Skin:    Findings: No rash.  Neurological:     Mental Status: She is alert and oriented to person, place, and time.  Psychiatric:        Mood and Affect: Mood normal.     ED Results / Procedures / Treatments   Labs (all labs ordered are listed, but only abnormal results are displayed) Labs Reviewed  BASIC METABOLIC PANEL - Abnormal; Notable for the following components:      Result Value   Potassium 3.4 (*)    Glucose, Bld 174 (*)     All other components within normal limits  D-DIMER, QUANTITATIVE - Abnormal; Notable for the following components:   D-Dimer, Quant 0.52 (*)    All other components within normal limits  CBC WITH DIFFERENTIAL/PLATELET - Abnormal; Notable for the following components:   WBC 11.0 (*)    Hemoglobin 16.2 (*)    HCT 49.3 (*)    All other components within normal limits  BRAIN NATRIURETIC PEPTIDE - Abnormal; Notable for the following components:   B Natriuretic Peptide 210.8 (*)    All other components within normal limits  POC URINE PREG, ED    EKG EKG Interpretation  Date/Time:  Thursday August 06 2022 12:01:59 EDT Ventricular Rate:  85 PR Interval:  102 QRS Duration: 92 QT Interval:  362 QTC Calculation: 431 R Axis:   42 Text Interpretation: Ectopic atrial rhythm Short PR interval Confirmed by Linwood Dibbles [phone]) on 08/06/2022 12:11:15 PM  Radiology CT Angio Chest PE W and/or Wo Contrast  Result Date: 08/06/2022 CLINICAL DATA:  Pulmonary embolism (PE) suspected, low to intermediate prob, positive D-dimer. EXAM: CT ANGIOGRAPHY CHEST WITH CONTRAST TECHNIQUE: Multidetector CT imaging of the chest was performed using the standard protocol during bolus administration of intravenous contrast. Multiplanar CT image reconstructions and MIPs were obtained to evaluate the vascular anatomy. RADIATION DOSE REDUCTION: This exam was performed according to the departmental dose-optimization program which includes automated exposure control, adjustment of the mA and/or kV according to patient size and/or use of iterative reconstruction technique. CONTRAST:  80mL OMNIPAQUE IOHEXOL 350 MG/ML SOLN COMPARISON:  CTA chest 12/10/2020. FINDINGS: Cardiovascular: Satisfactory opacification of the pulmonary arteries to the segmental level. No evidence of pulmonary embolism. Normal heart size. No pericardial effusion. Mediastinum/Nodes: No enlarged mediastinal, hilar, or axillary lymph nodes. Thyroid gland, trachea, and  esophagus demonstrate no significant findings. Lungs/Pleura: Scattered areas of linear atelectasis. No consolidation or pulmonary edema. No pleural effusion or pneumothorax. Upper Abdomen: No acute abnormality. Musculoskeletal: No chest wall abnormality. No acute or significant osseous findings. Review of the MIP images confirms the above findings. IMPRESSION: 1. No evidence of pulmonary embolism or other acute intrathoracic process. 2. Scattered areas of linear atelectasis. Electronically Signed   By: Orvan Falconer M.D.   On: 08/06/2022 13:19   DG Chest 2 View  Result Date: 08/06/2022 CLINICAL DATA:  Cough and shortness of breath EXAM: CHEST - 2 VIEW COMPARISON:  X-ray 12/09/2020 and older FINDINGS: Hyperinflation. Subtle opacity left lung base. Atelectasis versus subtle infiltrate. Recommend follow-up. No pneumothorax, effusion or edema. Normal cardiopericardial silhouette. Overlapping cardiac leads. IMPRESSION: Subtle left lung base opacity. Atelectasis favored over infiltrate recommend follow-up Electronically Signed   By: Scarlette Shorts  Chales Abrahams M.D.   On: 08/06/2022 10:54    Procedures Procedures    Medications Ordered in ED Medications  iohexol (OMNIPAQUE) 350 MG/ML injection 80 mL (80 mLs Intravenous Contrast Given 08/06/22 1214)  furosemide (LASIX) injection 40 mg (40 mg Intravenous Given 08/06/22 1245)    ED Course/ Medical Decision Making/ A&P                             Medical Decision Making Amount and/or Complexity of Data Reviewed Labs: ordered. Radiology: ordered.   BP (!) 145/90 (BP Location: Left Arm)   Pulse 91   Temp 98.4 F (36.9 C) (Oral)   Resp 18   LMP 06/24/2022 (Approximate)   SpO2 95%   23:38 AM 42 year old for female with history of tobacco use, alcohol use, obesity, prior history of multifocal pneumonia presenting with complaints of cough and shortness of breath.  Patient report for the past week she has had a nonproductive cough but for the past 2 days she now  noticing increased shortness of breath.  Shortness of breath is more noticeable when she lays flat and now she has to sleep in a recliner.  She also noticed that her legs are more swollen than usual.  She does not endorse any fever or chills.  She does endorse some chest discomfort when she coughs but not coughing up any blood.  No nausea vomiting or diarrhea.  No history of PE or DVT no history of CHF.  On exam, patient is laying in bed coughing on occasion but in no acute respiratory discomfort.  Heart with normal rate and rhythm, she does have some faint crackles heard at the lower lung bases.  She does have 2+ pitting edema to bilateral lower extremities.  No JVD.  Vital signs overall reassuring.  -Labs ordered, independently viewed and interpreted by me.  Labs remarkable for BNP 210.8.  D-dimer 0.52 -The patient was maintained on a cardiac monitor.  I personally viewed and interpreted the cardiac monitored which showed an underlying rhythm of: NSR -Imaging independently viewed and interpreted by me and I agree with radiologist's interpretation.  Result remarkable for Chest CTA showing no PE or acute changes -This patient presents to the ED for concern of cough, this involves an extensive number of treatment options, and is a complaint that carries with it a high risk of complications and morbidity.  The differential diagnosis includes PE, pna, covid, flu, rsv, pleural effusion, PTX -Co morbidities that complicate the patient evaluation includes ARDS -Treatment includes lasix -Reevaluation of the patient after these medicines showed that the patient improved -PCP office notes or outside notes reviewed -Escalation to admission/observation considered: patients feels much better, is comfortable with discharge, and will follow up with PCP -Prescription medication considered, patient comfortable with Lasix, tessalon -Social Determinant of Health considered which includes tobacco use.   1:28  PM Workup overall reassuring.  No evidence of PE.  No pneumonia.  Patient does have elevated BNP.  Patient will benefit from outpatient evaluation including echocardiogram to assess for potential heart failure.  Will discharge home with a short course of Lasix as well as cough medication.  Return precaution given.         Final Clinical Impression(s) / ED Diagnoses Final diagnoses:  Peripheral edema  Acute cough    Rx / DC Orders ED Discharge Orders          Ordered    furosemide (LASIX) 20 MG  tablet  Daily        08/06/22 1342    benzonatate (TESSALON) 100 MG capsule  Every 8 hours        08/06/22 1342    Ambulatory referral to Cardiology       Comments: If you have not heard from the Cardiology office within the next 72 hours please call [phone].   08/06/22 1342              Fayrene Helper, PA-C 08/06/22 1344    Linwood Dibbles, MD 08/07/22 0700

## 2022-08-09 ENCOUNTER — Telehealth: Payer: Medicaid Other | Admitting: Nurse Practitioner

## 2022-08-09 DIAGNOSIS — J984 Other disorders of lung: Secondary | ICD-10-CM

## 2022-08-09 MED ORDER — ALBUTEROL SULFATE HFA 108 (90 BASE) MCG/ACT IN AERS
1.0000 | INHALATION_SPRAY | Freq: Four times a day (QID) | RESPIRATORY_TRACT | 0 refills | Status: DC | PRN
Start: 2022-08-09 — End: 2022-12-07

## 2022-08-09 MED ORDER — ALBUTEROL SULFATE (2.5 MG/3ML) 0.083% IN NEBU: 2.5000 mg | INHALATION_SOLUTION | Freq: Four times a day (QID) | RESPIRATORY_TRACT | 12 refills | Status: AC | PRN

## 2022-08-09 NOTE — Progress Notes (Signed)
Because we do not have a way to check your electrolytes after taking a strong diuretic such as furosemide we would not be able to prescribe any additional refills of this medication. For now I would recommend compression stockings and keeping your legs elevated as much as possible. No salt diet. Avoid eating out and only cook meals at home until you can be seen in person by a PCP or cardiology.  I have sent your nebulizers for the cough and wheezing however.     USE OF BRONCHODILATOR ("RESCUE") INHALERS: There is a risk from using your bronchodilator too frequently.  The risk is that over-reliance on a medication which only relaxes the muscles surrounding the breathing tubes can reduce the effectiveness of medications prescribed to reduce swelling and congestion of the tubes themselves.  Although you feel brief relief from the bronchodilator inhaler, your asthma may actually be worsening with the tubes becoming more swollen and filled with mucus.  This can delay other crucial treatments, such as oral steroid medications. If you need to use a bronchodilator inhaler daily, several times per day, you should discuss this with your provider.  There are probably better treatments that could be used to keep your asthma under control.     HOME CARE Only take medications as instructed by your medical team. Cover your mouth if you cough or cough into your sleeve. Always remember to wash your hands A steam or ultrasonic humidifier can help congestion.   GET HELP RIGHT AWAY IF: You develop worsening fever. You become short of breath You cough up blood. Your symptoms persist after you have completed your treatment plan MAKE SURE YOU  Understand these instructions. Will watch your condition. Will get help right away if you are not doing well or get worse.    Thank you for choosing an e-visit.  Your e-visit answers were reviewed by a board certified advanced clinical practitioner to complete your  personal care plan. Depending upon the condition, your plan could have included both over the counter or prescription medications.  Please review your pharmacy choice. Make sure the pharmacy is open so you can pick up prescription now. If there is a problem, you may contact your provider through [employer] and have the prescription routed to another pharmacy.  Your safety is important to [place]. If you have drug allergies check your prescription carefully.   For the next 24 hours you can use MyChart to ask questions about today's visit, request a non-urgent call back, or ask for a work or school excuse. You will get an email in the next two days asking about your experience. I hope that your e-visit has been valuable and will speed your recovery.

## 2022-08-09 NOTE — Progress Notes (Signed)
I have spent 5 minutes in review of e-visit questionnaire, review and updating patient chart, medical decision making and response to patient.  ° °Catherine Harries W Karim Aiello, NP ° °  °

## 2022-08-12 ENCOUNTER — Other Ambulatory Visit: Payer: Self-pay

## 2022-08-12 ENCOUNTER — Emergency Department (HOSPITAL_COMMUNITY): Payer: Medicaid Other

## 2022-08-12 ENCOUNTER — Emergency Department (HOSPITAL_COMMUNITY)
Admission: EM | Admit: 2022-08-12 | Discharge: 2022-08-12 | Disposition: A | Discharge status: Home / Self Care | Payer: Medicaid Other | Attending: Emergency Medicine | Admitting: Emergency Medicine

## 2022-08-12 ENCOUNTER — Encounter (HOSPITAL_COMMUNITY): Payer: Self-pay

## 2022-08-12 DIAGNOSIS — F17200 Nicotine dependence, unspecified, uncomplicated: Secondary | ICD-10-CM | POA: Diagnosis not present

## 2022-08-12 DIAGNOSIS — J40 Bronchitis, not specified as acute or chronic: Secondary | ICD-10-CM | POA: Insufficient documentation

## 2022-08-12 DIAGNOSIS — R0609 Other forms of dyspnea: Secondary | ICD-10-CM

## 2022-08-12 DIAGNOSIS — R059 Cough, unspecified: Secondary | ICD-10-CM | POA: Diagnosis present

## 2022-08-12 DIAGNOSIS — Z1152 Encounter for screening for COVID-19: Secondary | ICD-10-CM | POA: Insufficient documentation

## 2022-08-12 LAB — COMPREHENSIVE METABOLIC PANEL
ALT: 83 U/L — ABNORMAL HIGH (ref 0–44)
AST: 100 U/L — ABNORMAL HIGH (ref 15–41)
Albumin: 3.8 g/dL (ref 3.5–5.0)
Alkaline Phosphatase: 86 U/L (ref 38–126)
Anion gap: 11 (ref 5–15)
BUN: 7 mg/dL (ref 6–20)
CO2: 23 mmol/L (ref 22–32)
Calcium: 9.3 mg/dL (ref 8.9–10.3)
Chloride: 102 mmol/L (ref 98–111)
Creatinine, Ser: 0.64 mg/dL (ref 0.44–1.00)
GFR, Estimated: >60 mL/min (ref >60)
Glucose, Bld: 96 mg/dL (ref 70–99)
Potassium: 3.7 mmol/L (ref 3.5–5.1)
Sodium: 136 mmol/L (ref 135–145)
Total Bilirubin: 0.7 mg/dL (ref 0.3–1.2)
Total Protein: 7.1 g/dL (ref 6.5–8.1)

## 2022-08-12 LAB — CBC WITH DIFFERENTIAL/PLATELET
Abs Immature Granulocytes: 0.05 10*3/uL (ref 0.00–0.07)
Basophils Absolute: 0.1 10*3/uL (ref 0.0–0.1)
Basophils Relative: 1 %
Eosinophils Absolute: 0.6 10*3/uL — ABNORMAL HIGH (ref 0.0–0.5)
Eosinophils Relative: 5 %
HCT: 51.7 % — ABNORMAL HIGH (ref 36.0–46.0)
Hemoglobin: 17.2 g/dL — ABNORMAL HIGH (ref 12.0–15.0)
Immature Granulocytes: 0 %
Lymphocytes Relative: 24 %
Lymphs Abs: 3.2 10*3/uL (ref 0.7–4.0)
MCH: 33.2 pg (ref 26.0–34.0)
MCHC: 33.3 g/dL (ref 30.0–36.0)
MCV: 99.8 fL (ref 80.0–100.0)
Monocytes Absolute: 0.6 10*3/uL (ref 0.1–1.0)
Monocytes Relative: 5 %
Neutro Abs: 8.6 10*3/uL — ABNORMAL HIGH (ref 1.7–7.7)
Neutrophils Relative %: 65 %
Platelets: 246 10*3/uL (ref 150–400)
RBC: 5.18 MIL/uL — ABNORMAL HIGH (ref 3.87–5.11)
RDW: 13.9 % (ref 11.5–15.5)
WBC: 13.2 10*3/uL — ABNORMAL HIGH (ref 4.0–10.5)
nRBC: 0 % (ref 0.0–0.2)

## 2022-08-12 LAB — RESP PANEL BY RT-PCR (RSV, FLU A&B, COVID)  RVPGX2
Influenza A by PCR: NEGATIVE
Influenza B by PCR: NEGATIVE
Resp Syncytial Virus by PCR: NEGATIVE
SARS Coronavirus 2 by RT PCR: NEGATIVE

## 2022-08-12 LAB — TROPONIN I (HIGH SENSITIVITY)
Troponin I (High Sensitivity): 7 ng/L (ref <18)
Troponin I (High Sensitivity): 8 ng/L (ref <18)

## 2022-08-12 LAB — BRAIN NATRIURETIC PEPTIDE: B Natriuretic Peptide: 88.5 pg/mL (ref 0.0–100.0)

## 2022-08-12 LAB — MAGNESIUM: Magnesium: 1.8 mg/dL (ref 1.7–2.4)

## 2022-08-12 MED ORDER — AZITHROMYCIN 250 MG PO TABS: 250.0000 mg | ORAL_TABLET | Freq: Every day | ORAL | 0 refills | Status: DC

## 2022-08-12 MED ORDER — AEROCHAMBER PLUS FLO-VU MISC
1.0000 | Freq: Once | Status: DC
  Filled 2022-08-12: qty 1

## 2022-08-12 MED ORDER — DEXAMETHASONE 4 MG PO TABS
10.0000 mg | ORAL_TABLET | Freq: Once | ORAL | Status: AC
  Administered 2022-08-12: 10 mg via ORAL
  Filled 2022-08-12: qty 3

## 2022-08-12 MED ORDER — ALBUTEROL SULFATE HFA 108 (90 BASE) MCG/ACT IN AERS
8.0000 | INHALATION_SPRAY | Freq: Once | RESPIRATORY_TRACT | Status: AC
  Administered 2022-08-12: 8 via RESPIRATORY_TRACT
  Filled 2022-08-12: qty 6.7

## 2022-08-12 NOTE — ED Provider Triage Note (Signed)
Emergency Medicine Provider Triage Evaluation Note  Catherine Cannon , a 42 y.o. female  was evaluated in triage.  Pt complains of concern for CHF.  Recently evaluated and was referred to cardiology.  States she has not been able to follow-up with cardiology but has an appointment set up for mid July.  Reports significant orthopnea and that she has to sleep on her couch sitting upright.  Was given a 3-day course of Lasix which she states did help initially.  Review of Systems  Positive: As above Negative: As above  Physical Exam  There were no vitals taken for this visit. Gen:   Awake, no distress   Resp:  Normal effort  MSK:   Moves extremities without difficulty  Other:    Medical Decision Making  Medically screening exam initiated at 1:02 PM.  Appropriate orders placed.  Catherine Cannon was informed that the remainder of the evaluation will be completed by another provider, this initial triage assessment does not replace that evaluation, and the importance of remaining in the ED until their evaluation is complete.     Catherine [place], PA-C 08/12/22 1303

## 2022-08-12 NOTE — Discharge Instructions (Addendum)
Take tylenol 2 pills 4 times a day and motrin 4 pills 3 times a day.  Drink plenty of fluids.  Return for worsening shortness of breath, headache, confusion. Follow up with your family doctor.   Below is max dosing for your inhaler for home.  This will make your jittery and make it feel like your heart is racing if you use this much.  Use your inhaler every 4 hours(6 puffs) while awake, return for sudden worsening shortness of breath, or if you need to use your inhaler more often.

## 2022-08-12 NOTE — ED Triage Notes (Signed)
Pt c/o nonproductive cough and SOB x2 weeks.  Pt reports being seen last week at [facility] for same and referred to Cardiology d/t CHF.  Pt reports she can't get an appointment until July.  Pt reports she ran out of Lasix.

## 2022-08-12 NOTE — ED Provider Notes (Signed)
[place] EMERGENCY DEPARTMENT AT [facility] Provider Note   CSN: [id number] Arrival date & time: 08/12/22  1247     History  Chief Complaint  Patient presents with   Cough   Shortness of Breath    Catherine Cannon is a 42 y.o. female.  42 yo F with a chief complaints of difficulty breathing.  She has had a cough for about 2 weeks now.  She feels like it is going into her lungs.  She has been seen in the ER setting about a week ago and was told that she might have new heart failure and was encouraged to follow-up with cardiologist in the office.  She has not yet been able to set an appointment.  She does feel like it is hard to breathe when she lays back flat.  She is then been sleeping on a couch and is noticed that her legs are a bit swollen.  She denies history of heart failure.  She does smoke every day.   Cough Associated symptoms: shortness of breath   Shortness of Breath Associated symptoms: cough        Home Medications Prior to Admission medications   Medication Sig Start Date End Date Taking? Authorizing Provider  azithromycin (ZITHROMAX) 250 MG tablet Take 1 tablet (250 mg total) by mouth daily. Take first 2 tablets together, then 1 every day until finished. 08/12/22  Yes Melene Plan, DO  acetaminophen (TYLENOL) 500 MG tablet Take 500 mg by mouth every 4 (four) hours as needed for mild pain.    [provider]  albuterol (PROVENTIL) (2.5 MG/3ML) 0.083% nebulizer solution Take 3 mLs (2.5 mg total) by nebulization every 6 (six) hours as needed for wheezing or shortness of breath. 08/09/22   Claiborne Rigg, NP  albuterol (VENTOLIN HFA) 108 (90 Base) MCG/ACT inhaler Inhale 1-2 puffs into the lungs every 6 (six) hours as needed for wheezing or shortness of breath. 08/09/22   Claiborne Rigg, NP  benzonatate (TESSALON) 100 MG capsule Take 1 capsule (100 mg total) by mouth every 8 (eight) hours. 08/06/22   Fayrene Helper, PA-C  furosemide (LASIX) 20 MG tablet  Take 1 tablet (20 mg total) by mouth daily. 08/06/22   Fayrene Helper, PA-C  ipratropium-albuterol (DUONEB) 0.5-2.5 (3) MG/3ML SOLN Take 3 mLs by nebulization every 6 (six) hours as needed (shortness of breath and wheezing). 04/04/20   Bing Neighbors, NP  Pseudoeph-Doxylamine-DM-APAP (NYQUIL D COLD/FLU PO) Take 2 capsules by mouth every 6 (six) hours as needed (cold symptoms).    [provider]  Pseudoephedrine-APAP-DM (DAYQUIL MULTI-SYMPTOM COLD/FLU PO) Take 2 capsules by mouth daily as needed (cough/cold).    [provider]      Allergies    Patient has no known allergies.    Review of Systems   Review of Systems  Respiratory:  Positive for cough and shortness of breath.     Physical Exam Updated Vital Signs BP (!) 137/91 (BP Location: Right Arm)   Pulse 83   Temp 97.9 F (36.6 C) (Oral)   Resp 19   Ht 5\' 8"  (1.727 m)   Wt 114.3 kg   SpO2 95%   BMI 38.32 kg/m  Physical Exam Vitals and nursing note reviewed.  Constitutional:      General: She is not in acute distress.    Appearance: She is well-developed. She is not diaphoretic.  HENT:     Head: Normocephalic and atraumatic.  Eyes:  Pupils: Pupils are equal, round, and reactive to light.  Neck:     Vascular: No JVD.  Cardiovascular:     Rate and Rhythm: Normal rate and regular rhythm.     Heart sounds: No murmur heard.    No friction rub. No gallop.  Pulmonary:     Effort: Pulmonary effort is normal.     Breath sounds: Wheezing present. No rales.     Comments: Diffuse wheezes in all lung fields.  No rales no JVD Chest:     Chest wall: No edema.  Abdominal:     General: There is no distension.     Palpations: Abdomen is soft.     Tenderness: There is no abdominal tenderness.  Musculoskeletal:        General: No tenderness.     Cervical back: Normal range of motion and neck supple.     Right lower leg: No edema.     Left lower leg: No edema.  Skin:    General: Skin is warm and dry.   Neurological:     Mental Status: She is alert and oriented to person, place, and time.  Psychiatric:        Behavior: Behavior normal.     ED Results / Procedures / Treatments   Labs (all labs ordered are listed, but only abnormal results are displayed) Labs Reviewed  CBC WITH DIFFERENTIAL/PLATELET - Abnormal; Notable for the following components:      Result Value   WBC 13.2 (*)    RBC 5.18 (*)    Hemoglobin 17.2 (*)    HCT 51.7 (*)    Neutro Abs 8.6 (*)    Eosinophils Absolute 0.6 (*)    All other components within normal limits  COMPREHENSIVE METABOLIC PANEL - Abnormal; Notable for the following components:   AST 100 (*)    ALT 83 (*)    All other components within normal limits  RESP PANEL BY RT-PCR (RSV, FLU A&B, COVID)  RVPGX2  BRAIN NATRIURETIC PEPTIDE  MAGNESIUM  TROPONIN I (HIGH SENSITIVITY)  TROPONIN I (HIGH SENSITIVITY)    EKG EKG Interpretation  Date/Time:  Wednesday August 12 2022 12:51:16 EDT Ventricular Rate:  75 PR Interval:  134 QRS Duration: 86 QT Interval:  378 QTC Calculation: 422 R Axis:   44 Text Interpretation: Normal sinus rhythm Normal ECG No significant change since last tracing Confirmed by Melene Plan [phone]) on 08/12/2022 3:33:42 PM  Radiology DG Chest 2 View  Result Date: 08/12/2022 CLINICAL DATA:  chest pain EXAM: CHEST - 2 VIEW COMPARISON:  Chest x-ray August 06, 2022. FINDINGS: Mild streaky bibasilar opacities. No confluent consolidation. No visible pleural effusions or pneumothorax. Cardiomediastinal silhouette is normal. IMPRESSION: Mild streaky bibasilar opacities, favor atelectasis. No confluent consolidation. Electronically Signed   By: Feliberto Harts M.D.   On: 08/12/2022 14:32    Procedures Procedures   Discussed smoking cessation with patient and was they were offerred resources to help stop.  Total time was 5 min CPT code 91478.     Medications Ordered in ED Medications  aerochamber plus with mask device 1 each (1 each  Other Not Given 08/12/22 1600)  dexamethasone (DECADRON) tablet 10 mg (10 mg Oral Given 08/12/22 1558)  albuterol (VENTOLIN HFA) 108 (90 Base) MCG/ACT inhaler 8 puff (8 puffs Inhalation Given 08/12/22 1559)    ED Course/ Medical Decision Making/ A&P  Medical Decision Making Risk Prescription drug management.   42 yo F with a chief complaints of cough.  This been going on for couple weeks.  She is well-appearing nontoxic.  She does have wheezes diffusely.  I suspect she has bronchitis.  Will give a dose of Decadron here 8 puffs albuterol or take an inhaler at home.  She was seen in the ED previously and there was some concern that maybe she had new onset heart failure or at least that was what was described to her.  I think it is unlikely that she has new heart failure.  Based on her history I think she has dependent edema because she has been sleeping on a couch because she felt like it was more comfortable that way.  She has no JVD no rales no concerning finding on x-ray consistent with heart failure and her BNP is negative.  She is somewhat apprehensive about this diagnosis and would like to see a cardiologist and so I will replace the referral for them.  She continues to smoke and I think that is part of her issue with her difficulty breathing.  Encouraged her to quit.  Given resources.  4:13 PM:  I have discussed the diagnosis/risks/treatment options with the patient.  Evaluation and diagnostic testing in the emergency department does not suggest an emergent condition requiring admission or immediate intervention beyond what has been performed at this time.  They will follow up with PCP, Cards. We also discussed returning to the ED immediately if new or worsening sx occur. We discussed the sx which are most concerning (e.g., sudden worsening pain, fever, inability to tolerate by mouth) that necessitate immediate return. Medications administered to the patient during  their visit and any new prescriptions provided to the patient are listed below.  Medications given during this visit Medications  aerochamber plus with mask device 1 each (1 each Other Not Given 08/12/22 1600)  dexamethasone (DECADRON) tablet 10 mg (10 mg Oral Given 08/12/22 1558)  albuterol (VENTOLIN HFA) 108 (90 Base) MCG/ACT inhaler 8 puff (8 puffs Inhalation Given 08/12/22 1559)     The patient appears reasonably screen and/or stabilized for discharge and I doubt any other medical condition or other [facility] requiring further screening, evaluation, or treatment in the ED at this time prior to discharge.          Final Clinical Impression(s) / ED Diagnoses Final diagnoses:  Bronchitis  Dyspnea on exertion    Rx / DC Orders ED Discharge Orders          Ordered    Ambulatory referral to Cardiology       Comments: If you have not heard from the Cardiology office within the next 72 hours please call [phone].   08/12/22 1544    azithromycin (ZITHROMAX) 250 MG tablet  Daily        08/12/22 1545              Melene Plan, DO 08/12/22 1613

## 2022-09-07 ENCOUNTER — Other Ambulatory Visit: Payer: Self-pay | Admitting: Nurse Practitioner

## 2022-09-07 DIAGNOSIS — J984 Other disorders of lung: Secondary | ICD-10-CM

## 2022-09-07 NOTE — Telephone Encounter (Signed)
Requested Prescriptions  Refused Prescriptions Disp Refills   VENTOLIN HFA 108 (90 Base) MCG/ACT inhaler [Pharmacy Med Name: VENTOLIN HFA 90 MCG INHALER] 18 each     Sig: INHALE 1-2 PUFFS BY MOUTH EVERY 6 HOURS AS NEEDED FOR WHEEZE OR SHORTNESS OF BREATH     Pulmonology:  Beta Agonists 2 Failed - 09/07/2022  2:37 AM      Failed - Last BP in normal range    BP Readings from Last 1 Encounters:  08/12/22 (!) 137/91         Failed - Valid encounter within last 12 months    Recent Outpatient Visits   None     Future Appointments             In 1 week Karie Georges, MD [facility] [place], [place]   In 2 weeks Maisie Fus, MD [facility] - Last Heart Rate in normal range    Pulse Readings from Last 1 Encounters:  08/12/22 83

## 2022-09-14 ENCOUNTER — Ambulatory Visit: Payer: Medicaid Other | Admitting: Family Medicine

## 2022-09-21 ENCOUNTER — Encounter: Payer: Self-pay | Admitting: Internal Medicine

## 2022-09-21 ENCOUNTER — Ambulatory Visit: Payer: Medicaid Other | Attending: Internal Medicine | Admitting: Internal Medicine

## 2022-09-21 VITALS — BP 132/96 | HR 77 | Ht 69.0 in | Wt 263.4 lb

## 2022-09-21 DIAGNOSIS — M7989 Other specified soft tissue disorders: Secondary | ICD-10-CM

## 2022-09-21 DIAGNOSIS — R0602 Shortness of breath: Secondary | ICD-10-CM | POA: Diagnosis not present

## 2022-09-21 MED ORDER — FUROSEMIDE 20 MG PO TABS: 20.0000 mg | ORAL_TABLET | Freq: Every day | ORAL | 3 refills | Status: AC

## 2022-09-21 MED ORDER — FUROSEMIDE 20 MG PO TABS: 20.0000 mg | ORAL_TABLET | Freq: Every day | ORAL | 0 refills | Status: DC

## 2022-09-21 NOTE — Patient Instructions (Addendum)
Medication Instructions:  Refilled Lasix 20 mg *If you need a refill on your cardiac medications before your next appointment, please call your pharmacy*   Lab Work: None needed  If you have labs (blood work) drawn today and your tests are completely normal, you will receive your results only by: MyChart Message (if you have MyChart) OR A paper copy in the mail If you have any lab test that is abnormal or we need to change your treatment, we will call you to review the results.     Follow-Up: At [facility], you and your health needs are our priority.  As part of our continuing mission to provide you with exceptional heart care, we have created designated Provider Care Teams.  These Care Teams include your primary Cardiologist (physician) and Advanced Practice Providers (APPs -  Physician Assistants and Nurse Practitioners) who all work together to provide you with the care you need, when you need it.      Your next appointment:   6 month(s)  Provider:   Maisie Fus, MD    Other Instructions Your physician has requested that you have an echocardiogram. Echocardiography is a painless test that uses sound waves to create images of your heart. It provides your doctor with information about the size and shape of your heart and how well your heart's chambers and valves are working. This procedure takes approximately one hour. There are no restrictions for this procedure. Please do NOT wear cologne, perfume, aftershave, or lotions (deodorant is allowed). Please arrive 15 minutes prior to your appointment time.    Heart Failure Action Plan A heart failure action plan helps you understand what to do when you have symptoms of heart failure. Your action plan is a color-coded plan that lists the symptoms to watch for and indicates what actions to take. If you have symptoms in the red zone, you need medical care right away. If you have symptoms in the yellow zone, you are having  problems. If you have symptoms in the green zone, you are doing well. Follow the plan that was created by you and your health care provider. Review your plan each time you visit your health care provider. Red zone These signs and symptoms mean you should get medical help right away: You have trouble breathing when resting. You have a dry cough that is getting worse. You have swelling or pain in your legs or abdomen that is getting worse. You suddenly gain more than 2-3 lb (0.9-1.4 kg) in 24 hours, or more than 5 lb (2.3 kg) in a week. This amount may be more or less depending on your condition. You have trouble staying awake or you feel confused. You have chest pain. You do not have an appetite. You pass out. You have worsening sadness or depression. If you have any of these symptoms, call your local emergency services (911 in the [place] right away. Do not drive yourself to the hospital. Yellow zone These signs and symptoms mean your condition may be getting worse and you should make some changes: You have trouble breathing when you are active, or you need to sleep with your head raised on extra pillows to help you breathe. You have swelling in your legs or abdomen. You gain 2-3 lb (0.9-1.4 kg) in 24 hours, or 5 lb (2.3 kg) in a week. This amount may be more or less depending on your condition. You get tired easily. You have trouble sleeping. You have a dry cough.  If you have any of these symptoms: Contact your health care provider within the next day. Your health care provider may adjust your medicines. Green zone These signs mean you are doing well and can continue what you are doing: You do not have shortness of breath. You have very little swelling or no new swelling. Your weight is stable (no gain or loss). You have a normal activity level. You do not have chest pain or any other new symptoms. Follow these instructions at home: Take over-the-counter and prescription medicines  only as told by your health care provider. Weigh yourself daily. Your target weight is __________ lb (__________ kg). Call your health care provider if you gain more than __________ lb (__________ kg) in 24 hours, or more than __________ lb (__________ kg) in a week. Health care provider name: _____________________________________________________ Health care provider phone number: _____________________________________________________ Eat a heart-healthy diet. Work with a diet and nutrition specialist (dietitian) to create an eating plan that is best for you. Keep all follow-up visits. This is important. Where to find more information American [employer] Association: Summary A heart failure action plan helps you understand what to do when you have symptoms of heart failure. Follow the action plan that was created by you and your health care provider. Get help right away if you have any symptoms in the red zone. This information is not intended to replace advice given to you by your health care provider. Make sure you discuss any questions you have with your health care provider. Document Revised: 05/20/2021 Document Reviewed: 09/25/2019 Elsevier Patient Education  2024 [employer].

## 2022-09-21 NOTE — Progress Notes (Addendum)
Cardiology Office Note:    Date:  09/21/2022   ID:  Catherine Cannon, DOB [birth year], MRN [id number]  PCP:  Aviva Kluver   [facility] Providers Cardiologist:  Maisie Fus, MD     Referring MD: No ref. provider found   No chief complaint on file. Peripheral Edema  History of Present Illness:    Catherine Cannon is a 42 y.o. female with no cardiac hx, ARDS PNA 2019,went to the ED 08/12/2022 with SOB and cough. Had possible orthopnea. Her legs had mild swelling. BNP was 210 6/13. 6/19 it was normal. She has bronchitis and hx of PNA.   - SOB for 6 months - Leg swelling for 1 year (initially intermittent) - Cough for 4 months - Recent bronchitis - Worsening symptoms: some difficulty lying flat, sleeps with 2 pillows - On bad days, sleeps sitting up on couch She reports legs swelling that has improved since she started lasix, she ran out. Smoker, etoh abuse EKG showed ectopic rhythm. 08/06/2022 CTA  08/06/2022. No CAC on CT. Negative for pE Crt 0.64, GFR >60 Echo in 2019, showed normal EF. She has no hx of heart failure.  Past Medical History:  Diagnosis Date   Acute respiratory failure with hypoxia (HCC) 10/16/2017   ARDS (adult respiratory distress syndrome) (HCC) 10/19/2017   Multifocal pneumonia 10/16/2017   Sepsis (HCC) 10/16/2017    Past Surgical History:  Procedure Laterality Date   NO PAST SURGERIES      Current Medications: Current Meds  Medication Sig   acetaminophen (TYLENOL) 500 MG tablet Take 500 mg by mouth every 4 (four) hours as needed for mild pain.   albuterol (PROVENTIL) (2.5 MG/3ML) 0.083% nebulizer solution Take 3 mLs (2.5 mg total) by nebulization every 6 (six) hours as needed for wheezing or shortness of breath.   albuterol (VENTOLIN HFA) 108 (90 Base) MCG/ACT inhaler Inhale 1-2 puffs into the lungs every 6 (six) hours as needed for wheezing or shortness of breath.   ipratropium-albuterol (DUONEB) 0.5-2.5 (3) MG/3ML SOLN Take 3 mLs by nebulization every  6 (six) hours as needed (shortness of breath and wheezing).     Allergies:   Patient has no known allergies.   Social History   Socioeconomic History   Marital status: Married    Spouse name: Not on file   Number of children: Not on file   Years of education: Not on file   Highest education level: Not on file  Occupational History   Not on file  Tobacco Use   Smoking status: Every Day    Current packs/day: 0.50    Types: Cigarettes   Smokeless tobacco: Never  Vaping Use   Vaping status: Never Used  Substance and Sexual Activity   Alcohol use: Yes    Comment: occ    Drug use: Not Currently    Types: Cocaine    Comment: years ago   Sexual activity: Not Currently    Birth control/protection: None  Other Topics Concern   Not on file  Social History Narrative   Not on file   Social Determinants of Health   Financial Resource Strain: Low Risk  (04/14/2018)   Overall Financial Resource Strain (CARDIA)    Difficulty of Paying Living Expenses: Not hard at all  Food Insecurity: No Food Insecurity (04/14/2018)   Hunger Vital Sign    Worried About Running Out of Food in the Last Year: Never true    Ran Out of Food in the Last Year: Never  true  Transportation Needs: Unknown (04/14/2018)   PRAPARE - Administrator, Civil Service (Medical): No    Lack of Transportation (Non-Medical): Not on file  Physical Activity: Not on file  Stress: No Stress Concern Present (04/14/2018)   [employer] of Occupational Health - Occupational Stress Questionnaire    Feeling of Stress : Only a little  Social Connections: Not on file     Family History: The patient's family history includes Asthma in her mother.  ROS:   Please see the history of present illness.     All other systems reviewed and are negative.  EKGs/Labs/Other Studies Reviewed:    The following studies were reviewed today:  Recent Labs: 08/12/2022: ALT 83; B Natriuretic Peptide 88.5; BUN 7; Creatinine, Ser  0.64; Hemoglobin 17.2; Magnesium 1.8; Platelets 246; Potassium 3.7; Sodium 136  Recent Lipid Panel    Component Value Date/Time   TRIG 199 (H) 10/19/2017 0434     Risk Assessment/Calculations:      Physical Exam:    VS:  BP (!) 132/96 (BP Location: Left Arm, Patient Position: Sitting, Cuff Size: Large)   Pulse 77   Ht 5\' 9"  (1.753 m)   Wt 263 lb 6.4 oz (119.5 kg)   SpO2 96%   BMI 38.90 kg/m     Wt Readings from Last 3 Encounters:  09/21/22 263 lb 6.4 oz (119.5 kg)  08/12/22 252 lb (114.3 kg)  10/21/21 252 lb (114.3 kg)     GEN:  Well nourished, well developed in no acute distress HEENT: Normal NECK: No JVD LYMPHATICS: No lymphadenopathy CARDIAC: RRR, no murmurs, rubs, gallops RESPIRATORY:  +Cough. Clear to auscultation without rales, wheezing or rhonchi  ABDOMEN: Soft, non-tender, non-distended MUSCULOSKELETAL:  No LE edema; No deformity  SKIN: Warm and dry NEUROLOGIC:  Alert and oriented x 3 PSYCHIATRIC:  Normal affect   ASSESSMENT:    Obesity Doe/LE edema Possible HFpEF - clinically no signs of decompensated CHF today. In June, BNP mildly elevated which can be underestimated with obesity. Prior echo 2019 showed E/e' 13. This is c/f possible HFpEF. She has also has hx of bronchitis which can explain her symptoms as well. She is on lasix, she can continue this.  Will repeat echo Recommend smoking cessation  Ectopic Rhythm -benign PLAN:    In order of problems listed above:  Continue lasix 20 mg daily TTE Provide education for HF FU 6 months     Medication Adjustments/Labs and Tests Ordered: Current medicines are reviewed at length with the patient today.  Concerns regarding medicines are outlined above.  Orders Placed This Encounter  Procedures   ECHOCARDIOGRAM COMPLETE   Meds ordered this encounter  Medications   DISCONTD: furosemide (LASIX) 20 MG tablet    Sig: Take 1 tablet (20 mg total) by mouth daily.    Dispense:  3 tablet    Refill:  0    furosemide (LASIX) 20 MG tablet    Sig: Take 1 tablet (20 mg total) by mouth daily.    Dispense:  90 tablet    Refill:  3    Patient Instructions  Medication Instructions:  Refilled Lasix 20 mg *If you need a refill on your cardiac medications before your next appointment, please call your pharmacy*   Lab Work: None needed  If you have labs (blood work) drawn today and your tests are completely normal, you will receive your results only by: MyChart Message (if you have MyChart) OR A paper copy in the  mail If you have any lab test that is abnormal or we need to change your treatment, we will call you to review the results.     Follow-Up: At [facility], you and your health needs are our priority.  As part of our continuing mission to provide you with exceptional heart care, we have created designated Provider Care Teams.  These Care Teams include your primary Cardiologist (physician) and Advanced Practice Providers (APPs -  Physician Assistants and Nurse Practitioners) who all work together to provide you with the care you need, when you need it.      Your next appointment:   6 month(s)  Provider:   Maisie Fus, MD    Other Instructions Your physician has requested that you have an echocardiogram. Echocardiography is a painless test that uses sound waves to create images of your heart. It provides your doctor with information about the size and shape of your heart and how well your heart's chambers and valves are working. This procedure takes approximately one hour. There are no restrictions for this procedure. Please do NOT wear cologne, perfume, aftershave, or lotions (deodorant is allowed). Please arrive 15 minutes prior to your appointment time.    Heart Failure Action Plan A heart failure action plan helps you understand what to do when you have symptoms of heart failure. Your action plan is a color-coded plan that lists the symptoms to watch for and  indicates what actions to take. If you have symptoms in the red zone, you need medical care right away. If you have symptoms in the yellow zone, you are having problems. If you have symptoms in the green zone, you are doing well. Follow the plan that was created by you and your health care provider. Review your plan each time you visit your health care provider. Red zone These signs and symptoms mean you should get medical help right away: You have trouble breathing when resting. You have a dry cough that is getting worse. You have swelling or pain in your legs or abdomen that is getting worse. You suddenly gain more than 2-3 lb (0.9-1.4 kg) in 24 hours, or more than 5 lb (2.3 kg) in a week. This amount may be more or less depending on your condition. You have trouble staying awake or you feel confused. You have chest pain. You do not have an appetite. You pass out. You have worsening sadness or depression. If you have any of these symptoms, call your local emergency services (911 in the [place] right away. Do not drive yourself to the hospital. Yellow zone These signs and symptoms mean your condition may be getting worse and you should make some changes: You have trouble breathing when you are active, or you need to sleep with your head raised on extra pillows to help you breathe. You have swelling in your legs or abdomen. You gain 2-3 lb (0.9-1.4 kg) in 24 hours, or 5 lb (2.3 kg) in a week. This amount may be more or less depending on your condition. You get tired easily. You have trouble sleeping. You have a dry cough. If you have any of these symptoms: Contact your health care provider within the next day. Your health care provider may adjust your medicines. Green zone These signs mean you are doing well and can continue what you are doing: You do not have shortness of breath. You have very little swelling or no new swelling. Your weight is stable (no gain or loss).  You have a  normal activity level. You do not have chest pain or any other new symptoms. Follow these instructions at home: Take over-the-counter and prescription medicines only as told by your health care provider. Weigh yourself daily. Your target weight is __________ lb (__________ kg). Call your health care provider if you gain more than __________ lb (__________ kg) in 24 hours, or more than __________ lb (__________ kg) in a week. Health care provider name: _____________________________________________________ Health care provider phone number: _____________________________________________________ Eat a heart-healthy diet. Work with a diet and nutrition specialist (dietitian) to create an eating plan that is best for you. Keep all follow-up visits. This is important. Where to find more information American Heart Association: Summary A heart failure action plan helps you understand what to do when you have symptoms of heart failure. Follow the action plan that was created by you and your health care provider. Get help right away if you have any symptoms in the red zone. This information is not intended to replace advice given to you by your health care provider. Make sure you discuss any questions you have with your health care provider. Document Revised: 05/20/2021 Document Reviewed: 09/25/2019 Elsevier Patient Education  [address]     Signed, Maisie Fus, MD  09/21/2022 2:33 PM    [facility]

## 2022-10-13 ENCOUNTER — Ambulatory Visit (HOSPITAL_COMMUNITY): Payer: Medicaid Other

## 2022-10-30 ENCOUNTER — Ambulatory Visit: Payer: Medicaid Other | Admitting: Family Medicine

## 2022-11-03 ENCOUNTER — Ambulatory Visit (HOSPITAL_COMMUNITY): Payer: Medicaid Other | Attending: Cardiology

## 2022-11-03 DIAGNOSIS — R0602 Shortness of breath: Secondary | ICD-10-CM | POA: Insufficient documentation

## 2022-11-03 DIAGNOSIS — M7989 Other specified soft tissue disorders: Secondary | ICD-10-CM | POA: Diagnosis present

## 2022-11-03 LAB — ECHOCARDIOGRAM COMPLETE
Area-P 1/2: 3.68 cm2
S' Lateral: 3.6 cm

## 2022-11-03 MED ORDER — PERFLUTREN LIPID MICROSPHERE
3.0000 mL | INTRAVENOUS | Status: AC | PRN
Start: 2022-11-03 — End: 2022-11-03
  Administered 2022-11-03: 3 mL via INTRAVENOUS

## 2022-12-07 ENCOUNTER — Ambulatory Visit: Payer: Medicaid Other | Admitting: Family Medicine

## 2022-12-07 ENCOUNTER — Encounter: Payer: Self-pay | Admitting: Family Medicine

## 2022-12-07 VITALS — BP 120/88 | HR 75 | Temp 98.6°F | Ht 68.75 in | Wt 272.9 lb

## 2022-12-07 DIAGNOSIS — J209 Acute bronchitis, unspecified: Secondary | ICD-10-CM | POA: Diagnosis not present

## 2022-12-07 DIAGNOSIS — J4540 Moderate persistent asthma, uncomplicated: Secondary | ICD-10-CM

## 2022-12-07 DIAGNOSIS — Z716 Tobacco abuse counseling: Secondary | ICD-10-CM

## 2022-12-07 DIAGNOSIS — J984 Other disorders of lung: Secondary | ICD-10-CM

## 2022-12-07 DIAGNOSIS — R7989 Other specified abnormal findings of blood chemistry: Secondary | ICD-10-CM | POA: Diagnosis not present

## 2022-12-07 DIAGNOSIS — F17210 Nicotine dependence, cigarettes, uncomplicated: Secondary | ICD-10-CM | POA: Diagnosis not present

## 2022-12-07 DIAGNOSIS — Z720 Tobacco use: Secondary | ICD-10-CM

## 2022-12-07 MED ORDER — VENTOLIN HFA 108 (90 BASE) MCG/ACT IN AERS
2.0000 | INHALATION_SPRAY | RESPIRATORY_TRACT | 2 refills | Status: DC | PRN
Start: 2022-12-07 — End: 2023-03-01

## 2022-12-07 MED ORDER — BUDESONIDE-FORMOTEROL FUMARATE 160-4.5 MCG/ACT IN AERO
2.0000 | INHALATION_SPRAY | Freq: Two times a day (BID) | RESPIRATORY_TRACT | 3 refills | Status: AC
Start: 2022-12-07 — End: ?

## 2022-12-07 MED ORDER — VARENICLINE TARTRATE (STARTER) 0.5 MG X 11 & 1 MG X 42 PO TBPK
ORAL_TABLET | ORAL | 0 refills | Status: AC
Start: 2022-12-07 — End: ?

## 2022-12-07 MED ORDER — DOXYCYCLINE HYCLATE 100 MG PO TABS
100.0000 mg | ORAL_TABLET | Freq: Two times a day (BID) | ORAL | 0 refills | Status: DC
Start: 2022-12-07 — End: 2023-02-10

## 2022-12-07 MED ORDER — VARENICLINE TARTRATE 1 MG PO TABS
1.0000 mg | ORAL_TABLET | Freq: Two times a day (BID) | ORAL | 3 refills | Status: AC
Start: 2022-12-07 — End: ?

## 2022-12-07 MED ORDER — PREDNISONE 20 MG PO TABS
ORAL_TABLET | ORAL | 0 refills | Status: AC
Start: 2022-12-07 — End: 2022-12-19

## 2022-12-07 NOTE — Progress Notes (Addendum)
New Patient Office Visit  Subjective    Patient ID: Catherine Cannon, female    DOB: 12-26-1980  Age: 42 y.o. MRN: [id number]  CC:  Chief Complaint  Patient presents with   Establish Care   Cough    Productive with yellow sputum x10 days, tried Dayquil with some relief    HPI Catherine Cannon presents to establish care Pt has been having a chronic cough that has been going on for a long time. States that she smokes about 1/2 - 1 ppd for about 20 years now, has been seen multiple times in the urgent care of URI's, bronchitis. Has a history of ARDS in 2019, states she was intubated for 12 days at [facility]-- states that they couldn't find out why it happened to her. Was seen in UC in June and was told she might have CHF, was sent to the cardiologist, had ECHO which was normal. She was given furosemide 20 mg daily for the peripheral edema she is experiencing-- states this has been going on for about 1 year now.   Pt today is reporting 10 days of coughing this episode, she reports that the cough is productive but she feels there are some things stuck in her chest that she cannot cough up.   I have reviewed all aspects of the patient's medical history including social, family, and surgical history.   Current Outpatient Medications  Medication Instructions   acetaminophen (TYLENOL) 500 mg, Oral, Every 4 hours PRN   albuterol (PROVENTIL) 2.5 mg, Nebulization, Every 6 hours PRN   albuterol (VENTOLIN HFA) 108 (90 Base) MCG/ACT inhaler 2 puffs, Inhalation, Every 4 hours PRN   budesonide-formoterol (SYMBICORT) 160-4.5 MCG/ACT inhaler 2 puffs, Inhalation, 2 times daily   doxycycline (VIBRA-TABS) 100 mg, Oral, 2 times daily   furosemide (LASIX) 20 mg, Oral, Daily   Loratadine (CLARITIN PO) Oral, Daily   varenicline (CHANTIX) 1 mg, Oral, 2 times daily   Varenicline Tartrate, Starter, (CHANTIX STARTING MONTH PAK) 0.5 MG X 11 & 1 MG X 42 TBPK Take one 0.5 mg tablet by mouth once daily for 3  days, then increase to one 0.5 mg tablet twice daily for 4 days, then increase to one 1 mg tablet twice daily.    Past Medical History:  Diagnosis Date   Acute respiratory failure with hypoxia (HCC) 10/16/2017   ARDS (adult respiratory distress syndrome) (HCC) 10/19/2017   Heart failure (HCC)    Multifocal pneumonia 10/16/2017   Sepsis (HCC) 10/16/2017    Past Surgical History:  Procedure Laterality Date   NO PAST SURGERIES      Family History  Problem Relation Age of Onset   Asthma Mother    Asthma Brother     Social History   Socioeconomic History   Marital status: Married    Spouse name: Not on file   Number of children: Not on file   Years of education: Not on file   Highest education level: Not on file  Occupational History   Not on file  Tobacco Use   Smoking status: Every Day    Current packs/day: 0.50    Types: Cigarettes   Smokeless tobacco: Never  Vaping Use   Vaping status: Never Used  Substance and Sexual Activity   Alcohol use: Yes    Comment: occ    Drug use: Not Currently    Types: Cocaine    Comment: years ago   Sexual activity: Yes    Birth control/protection: None  Other Topics Concern   Not on file  Social History Narrative   Not on file   Social Determinants of Health   Financial Resource Strain: Low Risk  (04/14/2018)   Overall Financial Resource Strain (CARDIA)    Difficulty of Paying Living Expenses: Not hard at all  Food Insecurity: No Food Insecurity (04/14/2018)   Hunger Vital Sign    Worried About Running Out of Food in the Last Year: Never true    Ran Out of Food in the Last Year: Never true  Transportation Needs: Unknown (04/14/2018)   PRAPARE - Administrator, Civil Service (Medical): No    Lack of Transportation (Non-Medical): Not on file  Physical Activity: Not on file  Stress: No Stress Concern Present (04/14/2018)   [employer] of Occupational Health - Occupational Stress Questionnaire    Feeling of  Stress : Only a little  Social Connections: Not on file  Intimate Partner Violence: Not At Risk (04/14/2018)   Humiliation, Afraid, Rape, and Kick questionnaire    Fear of Current or Ex-Partner: No    Emotionally Abused: No    Physically Abused: No    Sexually Abused: No    Review of Systems  Constitutional:  Negative for chills, fever and malaise/fatigue.  HENT:  Positive for congestion and sinus pain.   Respiratory:  Positive for cough, sputum production and shortness of breath.   Cardiovascular:  Positive for leg swelling. Negative for chest pain.  Musculoskeletal:  Negative for myalgias.  All other systems reviewed and are negative.       Objective    BP 120/88 (BP Location: Left Arm, Patient Position: Sitting, Cuff Size: Large)   Pulse 75   Temp 98.6 F (37 C) (Oral)   Ht 5' 8.75" (1.746 m)   Wt 272 lb 14.4 oz (123.8 kg)   LMP 11/13/2022 (Exact Date)   SpO2 95%   BMI 40.59 kg/m   Physical Exam Vitals reviewed.  Constitutional:      Appearance: Normal appearance. She is obese.  Cardiovascular:     Rate and Rhythm: Normal rate and regular rhythm.     Pulses: Normal pulses.     Heart sounds: Normal heart sounds. No murmur heard. Pulmonary:     Effort: Pulmonary effort is normal.     Breath sounds: Wheezing present. No rhonchi or rales.  Abdominal:     General: Bowel sounds are normal.  Neurological:     Mental Status: She is alert and oriented to person, place, and time. Mental status is at baseline.  Psychiatric:        Mood and Affect: Mood normal.        Behavior: Behavior normal.         Assessment & Plan:  Elevated liver function tests Assessment & Plan: Pt had these in June with previous labs, differential is broad and can including fatty liver disease,hepatotoxic substances, etc. I will check another set to confirm, she may need liver US depending on the results.   Orders: -     Hepatic function panel; Future  Tobacco use Assessment &  Plan: Five A's are as follows: Ask-- smoking history updated in computer Advise -- I urged patient to quit in a clear manner Assess-- pt  understands that her future lung health is dependent on quitting and she is willing to try. Assist-- I have counseled patient on cessation aids and will send in rx for varenicline. Arrange-- I will follow up with the patient  in 2 months to reassess her progress.   I spent 7 minutes on smoking cessation, we discussed options for treamtent and she is agreeable to try the chantix medication, rx sent to pharmacy.   Orders: -     Varenicline Tartrate (Starter); Take one 0.5 mg tablet by mouth once daily for 3 days, then increase to one 0.5 mg tablet twice daily for 4 days, then increase to one 1 mg tablet twice daily.  Dispense: 53 each; Refill: 0 -     Varenicline Tartrate; Take 1 tablet (1 mg total) by mouth 2 (two) times daily.  Dispense: 60 tablet; Refill: 3  Acute bronchitis, unspecified organism Active wheezing heard on exam today, will treat acute bronchitis with steroids, abs and ventolin inhaler. I will see her back in 2 months for a weight loss visit and to re-evaluate her lungs.   -     predniSONE; Take 3 tablets (60 mg total) by mouth daily with breakfast for 3 days, THEN 2 tablets (40 mg total) daily with breakfast for 3 days, THEN 1 tablet (20 mg total) daily with breakfast for 3 days, THEN 0.5 tablets (10 mg total) daily with breakfast for 3 days.  Dispense: 19.5 tablet; Refill: 0 -     Doxycycline Hyclate; Take 1 tablet (100 mg total) by mouth 2 (two) times daily.  Dispense: 14 tablet; Refill: 0 -     Ventolin HFA; Inhale 2 puffs into the lungs every 4 (four) hours as needed for wheezing or shortness of breath.  Dispense: 18 g; Refill: 2  Moderate persistent asthma, unspecified whether complicated Assessment & Plan: Chronic with wheezing and chronic cough, will rx steroid inhaler for maintenance therapy.   Orders: -     Budesonide-Formoterol  Fumarate; Inhale 2 puffs into the lungs 2 (two) times daily.  Dispense: 10.2 g; Refill: 3      Return in about 2 months (around 02/06/2023) for weight loss.   Karie Georges, MD

## 2022-12-15 ENCOUNTER — Other Ambulatory Visit: Payer: Medicaid Other

## 2022-12-21 DIAGNOSIS — K760 Fatty (change of) liver, not elsewhere classified: Secondary | ICD-10-CM | POA: Insufficient documentation

## 2022-12-21 DIAGNOSIS — J4540 Moderate persistent asthma, uncomplicated: Secondary | ICD-10-CM | POA: Insufficient documentation

## 2022-12-21 DIAGNOSIS — R7989 Other specified abnormal findings of blood chemistry: Secondary | ICD-10-CM | POA: Insufficient documentation

## 2022-12-21 NOTE — Assessment & Plan Note (Signed)
Chronic with wheezing and chronic cough, will rx steroid inhaler for maintenance therapy.

## 2022-12-21 NOTE — Assessment & Plan Note (Signed)
Pt had these in June with previous labs, differential is broad and can including fatty liver disease,hepatotoxic substances, etc. I will check another set to confirm, she may need liver US depending on the results.

## 2022-12-21 NOTE — Assessment & Plan Note (Signed)
I spent 7 minutes on smoking cessation, we discussed options for treamtent and she is agreeable to try the chantix medication, rx sent to pharmacy.

## 2022-12-22 ENCOUNTER — Other Ambulatory Visit: Payer: Medicaid Other

## 2022-12-24 ENCOUNTER — Other Ambulatory Visit (INDEPENDENT_AMBULATORY_CARE_PROVIDER_SITE_OTHER): Payer: Medicaid Other

## 2022-12-24 DIAGNOSIS — R7989 Other specified abnormal findings of blood chemistry: Secondary | ICD-10-CM | POA: Diagnosis not present

## 2022-12-24 LAB — HEPATIC FUNCTION PANEL
ALT: 69 U/L — ABNORMAL HIGH (ref 0–35)
AST: 67 U/L — ABNORMAL HIGH (ref 0–37)
Albumin: 4 g/dL (ref 3.5–5.2)
Alkaline Phosphatase: 88 U/L (ref 39–117)
Bilirubin, Direct: 0.1 mg/dL (ref 0.0–0.3)
Total Bilirubin: 0.5 mg/dL (ref 0.2–1.2)
Total Protein: 6.9 g/dL (ref 6.0–8.3)

## 2022-12-29 ENCOUNTER — Encounter: Payer: Self-pay | Admitting: Family Medicine

## 2022-12-29 DIAGNOSIS — R7989 Other specified abnormal findings of blood chemistry: Secondary | ICD-10-CM

## 2023-01-04 ENCOUNTER — Ambulatory Visit
Admission: RE | Admit: 2023-01-04 | Discharge: 2023-01-04 | Disposition: A | Discharge status: Home / Self Care | Payer: Medicaid Other | Source: Ambulatory Visit | Attending: Family Medicine

## 2023-01-04 DIAGNOSIS — R7989 Other specified abnormal findings of blood chemistry: Secondary | ICD-10-CM

## 2023-02-05 ENCOUNTER — Encounter: Payer: Self-pay | Admitting: Family Medicine

## 2023-02-05 ENCOUNTER — Ambulatory Visit (INDEPENDENT_AMBULATORY_CARE_PROVIDER_SITE_OTHER): Payer: Medicaid Other | Admitting: Family Medicine

## 2023-02-05 ENCOUNTER — Other Ambulatory Visit (HOSPITAL_BASED_OUTPATIENT_CLINIC_OR_DEPARTMENT_OTHER): Payer: Self-pay

## 2023-02-05 VITALS — BP 110/70 | HR 75 | Temp 98.1°F | Ht 68.75 in | Wt 274.0 lb

## 2023-02-05 DIAGNOSIS — F320 Major depressive disorder, single episode, mild: Secondary | ICD-10-CM

## 2023-02-05 DIAGNOSIS — J4540 Moderate persistent asthma, uncomplicated: Secondary | ICD-10-CM | POA: Diagnosis not present

## 2023-02-05 DIAGNOSIS — Z6841 Body Mass Index (BMI) 40.0 and over, adult: Secondary | ICD-10-CM | POA: Diagnosis not present

## 2023-02-05 DIAGNOSIS — R7303 Prediabetes: Secondary | ICD-10-CM

## 2023-02-05 LAB — POCT GLYCOSYLATED HEMOGLOBIN (HGB A1C): Hemoglobin A1C: 6.3 % — AB (ref 4.0–5.6)

## 2023-02-05 MED ORDER — WEGOVY 1 MG/0.5ML ~~LOC~~ SOAJ
1.0000 mg | SUBCUTANEOUS | 0 refills | Status: DC
Start: 2023-04-01 — End: 2023-09-14
  Filled 2023-02-05: qty 2, fill #0
  Filled 2023-02-19 – 2023-09-02 (×2): qty 2, 28d supply, fill #0

## 2023-02-05 MED ORDER — MONTELUKAST SODIUM 10 MG PO TABS
10.0000 mg | ORAL_TABLET | Freq: Every day | ORAL | 3 refills | Status: DC
Start: 2023-02-05 — End: 2023-05-04

## 2023-02-05 MED ORDER — BUPROPION HCL ER (XL) 150 MG PO TB24
150.0000 mg | ORAL_TABLET | Freq: Every day | ORAL | 3 refills | Status: DC
Start: 2023-02-05 — End: 2023-03-01

## 2023-02-05 MED ORDER — WEGOVY 0.25 MG/0.5ML ~~LOC~~ SOAJ
0.2500 mg | SUBCUTANEOUS | 0 refills | Status: DC
Start: 2023-02-05 — End: 2023-05-25
  Filled 2023-02-05 – 2023-02-19 (×2): qty 2, 28d supply, fill #0

## 2023-02-05 MED ORDER — WEGOVY 0.5 MG/0.5ML ~~LOC~~ SOAJ
0.5000 mg | SUBCUTANEOUS | 0 refills | Status: DC
Start: 2023-03-01 — End: 2023-05-25
  Filled 2023-02-05: qty 2, fill #0

## 2023-02-05 NOTE — Patient Instructions (Signed)
Total daily calories: 1969  Total protein:  above 100 grams per day  Total carbs per day: under 100 grams per day  Get an app on your phone to track these things  Start a multivitamin-- 100 % of vitamin D  and B vitamins, Nature made women's multi or little critters brand

## 2023-02-05 NOTE — Assessment & Plan Note (Signed)
Reviewed risks/benefits of wellbutrin, will start 150 mg daily. RTC in 3 months.

## 2023-02-05 NOTE — Assessment & Plan Note (Signed)
Counseled patient on low carb diet, see above

## 2023-02-05 NOTE — Assessment & Plan Note (Signed)
BMI is 40  Co morbid conditions include fatty liver disease and prediabetes.  I have had an extensive 30 minute conversation today with the patient about healthy eating habits, exercise, calorie and carb goals for sustainable and successful weight loss. I gave the patient caloric and protein daily intake values as well as described the importance of increasing fiber and water intake. I discussed weight loss medications that could be used in the treatment of this patient. Handouts on low carb eating were given to the patient.

## 2023-02-05 NOTE — Progress Notes (Signed)
Established Patient Office Visit  Subjective   Patient ID: Catherine Cannon, female    DOB: 1981/01/06  Age: 42 y.o. MRN: [id number]  Chief Complaint  Patient presents with   Medical Management of Chronic Issues    Pt is here for follow up today  Moderate persistent asthma-- pt reports that her coughing is better today, states that she is using the symbicort daily and it seems to be helping, states that she is still having some coughing but thinks it might be from her sinuses. We discussed using singulair once daily and she is agreeable. States she has not started the chantix yet, is waiting for the new year to start this medication.  Pt is reporting worsening depression symptoms today, states that this time of year is very difficult for her, is asking about starting wellbutrin to help with her depression symptoms and to help with smoking cessation.   Morbid obesity-- pt reports she has tried multiple diets in the past, states that due to her weight it is very difficult to exercise due to knee pain. We reviewed her abd [place] which did confirm fatty liver disease which is a co morbid condition. A1C performed in office today and she is positive for prediabetes.       Current Outpatient Medications  Medication Instructions   acetaminophen (TYLENOL) 500 mg, Every 4 hours PRN   albuterol (PROVENTIL) 2.5 mg, Nebulization, Every 6 hours PRN   albuterol (VENTOLIN HFA) 108 (90 Base) MCG/ACT inhaler 2 puffs, Inhalation, Every 4 hours PRN   budesonide-formoterol (SYMBICORT) 160-4.5 MCG/ACT inhaler 2 puffs, Inhalation, 2 times daily   buPROPion (WELLBUTRIN XL) 150 mg, Oral, Daily   doxycycline (VIBRA-TABS) 100 mg, Oral, 2 times daily   furosemide (LASIX) 20 mg, Oral, Daily   Loratadine (CLARITIN PO) Daily   montelukast (SINGULAIR) 10 mg, Oral, Daily at bedtime   varenicline (CHANTIX) 1 mg, Oral, 2 times daily   Varenicline Tartrate, Starter, (CHANTIX STARTING MONTH PAK) 0.5 MG X 11 & 1 MG X  42 TBPK Take one 0.5 mg tablet by mouth once daily for 3 days, then increase to one 0.5 mg tablet twice daily for 4 days, then increase to one 1 mg tablet twice daily.   Wegovy 0.25 mg, Subcutaneous, Weekly   [START ON 03/01/2023] Wegovy 0.5 mg, Subcutaneous, Weekly   [START ON 04/01/2023] Wegovy 1 mg, Subcutaneous, Weekly    Patient Active Problem List   Diagnosis Date Noted   Prediabetes 02/05/2023   Depression, major, single episode, mild (HCC) 02/05/2023   Moderate persistent asthma 12/21/2022   Fatty liver disease, nonalcoholic 12/21/2022   Dyspnea 84/13/2440   History of MRSA infection 03/14/2018   Morbid obesity (HCC) 12/22/2017   Tobacco use 12/22/2017   Intramural uterine fibroid - Retroplacental  12/22/2017   Trichomonal vaginitis during pregnancy in second trimester 11/15/2017     Flowsheet Row Office Visit from 12/07/2022 in [facility] [place]  PHQ-9 Total Score 2        Review of Systems  All other systems reviewed and are negative.     Objective:     BP 110/70   Pulse 75   Temp 98.1 F (36.7 C) (Oral)   Ht 5' 8.75" (1.746 m)   Wt 274 lb (124.3 kg)   LMP 01/08/2023 (Approximate)   SpO2 95%   BMI 40.76 kg/m    Physical Exam Vitals reviewed.  Constitutional:      Appearance: Normal appearance. She is well-groomed.  She is morbidly obese.  Cardiovascular:     Rate and Rhythm: Normal rate and regular rhythm.     Heart sounds: S1 normal and S2 normal.  Pulmonary:     Effort: Pulmonary effort is normal.     Breath sounds: Normal breath sounds and air entry. No wheezing.  Musculoskeletal:     Right lower leg: No edema.     Left lower leg: No edema.  Neurological:     Mental Status: She is alert and oriented to person, place, and time. Mental status is at baseline.     Gait: Gait is intact.  Psychiatric:        Mood and Affect: Mood normal. Affect is tearful.        Behavior: Behavior normal.      Results for orders placed  or performed in visit on 02/05/23  POC HgB A1c  Result Value Ref Range   Hemoglobin A1C 6.3 (A) 4.0 - 5.6 %   HbA1c POC (<> result, manual entry)     HbA1c, POC (prediabetic range)     HbA1c, POC (controlled diabetic range)        The ASCVD Risk score (Arnett DK, et al., 2019) failed to calculate for the following reasons:   Cannot find a previous HDL lab   Cannot find a previous total cholesterol lab    Assessment & Plan:  Moderate persistent asthma without complication Assessment & Plan: Lungs sound much better on exam today, will continue symbicort 2 puffs BID and albuterol inhalers 2 puffs every 4 hours PRN. Will also add singulair 10 mg once daily.   Orders: -     Montelukast Sodium; Take 1 tablet (10 mg total) by mouth at bedtime.  Dispense: 30 tablet; Refill: 3  Morbid obesity (HCC) Assessment & Plan: BMI is 40  Co morbid conditions include fatty liver disease and prediabetes.  I have had an extensive 30 minute conversation today with the patient about healthy eating habits, exercise, calorie and carb goals for sustainable and successful weight loss. I gave the patient caloric and protein daily intake values as well as described the importance of increasing fiber and water intake. I discussed weight loss medications that could be used in the treatment of this patient. Handouts on low carb eating were given to the patient.    Orders: -     POCT glycosylated hemoglobin (Hb A1C) -     Lipid panel; Future -     Wegovy; Inject 0.25 mg into the skin once a week.  Dispense: 2 mL; Refill: 0 -     Wegovy; Inject 0.5 mg into the skin once a week.  Dispense: 2 mL; Refill: 0 -     Wegovy; Inject 1 mg into the skin once a week.  Dispense: 2 mL; Refill: 0  Prediabetes Assessment & Plan: Counseled patient on low carb diet, see above   Depression, major, single episode, mild (HCC) Assessment & Plan: Reviewed risks/benefits of wellbutrin, will start 150 mg daily. RTC in 3 months.    Orders: -     buPROPion HCl ER (XL); Take 1 tablet (150 mg total) by mouth daily.  Dispense: 30 tablet; Refill: 3     Return in about 3 months (around 05/06/2023) for weight loss.    Karie Georges, MD

## 2023-02-05 NOTE — Assessment & Plan Note (Signed)
Lungs sound much better on exam today, will continue symbicort 2 puffs BID and albuterol inhalers 2 puffs every 4 hours PRN. Will also add singulair 10 mg once daily.

## 2023-02-08 ENCOUNTER — Telehealth: Payer: Self-pay | Admitting: Pharmacist

## 2023-02-08 ENCOUNTER — Encounter: Payer: Self-pay | Admitting: Family Medicine

## 2023-02-08 NOTE — Telephone Encounter (Signed)
Pharmacy Patient Advocate Encounter   Received notification from Patient Pharmacy that prior authorization for Wegovy 0.25MG /0.5ML auto-injectors is required/requested.   Insurance verification completed.   The patient is insured through [facility] .   Per test claim: PA required; PA submitted to above mentioned insurance via CoverMyMeds Key/confirmation #/EOC  [id number] Status is pending

## 2023-02-09 NOTE — Telephone Encounter (Signed)
Pharmacy Patient Advocate Encounter  Received notification from [facility] that Prior Authorization for [facility] 0.25MG /0.5ML auto-injectors has been DENIED.  See denial reason below. No denial letter attached in CMM. Will attach denial letter to Media tab once received.  Per your health plan's criteria, this drug is covered if you meet the following: Your doctor submits medical records (for example, chart notes, assessments, treatment plans, monitoring plans) confirming you are currently on and will continue lifestyle changes (including structured nutrition and physical activity, unless physical activity is not clinically appropriate at the time the requested therapy commences). The information provided does not show that you meet the criteria listed above. Please speak with your doctor about your choices.   PA #/Case ID/Reference #: ?[id number]    An expedited appeal has already been started.  The appeal letter and pertinent documentation has been faxed to [phone].  Dellie Burns, PharmD Clinical Pharmacist  [place]  Direct Dial: [phone]

## 2023-02-09 NOTE — Telephone Encounter (Signed)
Thank you, I did document that I gave her dietary goals/ restrictions, and her co morbid condition and that she failed diet and exercise in the past- she really should have qualified for the medication.

## 2023-02-10 ENCOUNTER — Ambulatory Visit
Admission: EM | Admit: 2023-02-10 | Discharge: 2023-02-10 | Disposition: A | Discharge status: Home / Self Care | Payer: Medicaid Other | Attending: Internal Medicine | Admitting: Internal Medicine

## 2023-02-10 ENCOUNTER — Encounter: Payer: Self-pay | Admitting: *Deleted

## 2023-02-10 ENCOUNTER — Other Ambulatory Visit: Payer: Self-pay

## 2023-02-10 DIAGNOSIS — R220 Localized swelling, mass and lump, head: Secondary | ICD-10-CM | POA: Diagnosis not present

## 2023-02-10 DIAGNOSIS — K047 Periapical abscess without sinus: Secondary | ICD-10-CM | POA: Diagnosis not present

## 2023-02-10 MED ORDER — AMOXICILLIN-POT CLAVULANATE 875-125 MG PO TABS: 1.0000 | ORAL_TABLET | Freq: Two times a day (BID) | ORAL | 0 refills | Status: AC

## 2023-02-10 NOTE — ED Provider Notes (Signed)
[facility]    CSN: [id number] Arrival date & time: 02/10/23  1119      History   Chief Complaint Chief Complaint  Patient presents with   Dental Pain    HPI Catherine Cannon is a 42 y.o. female.    Dental Pain Associated symptoms: facial swelling   Associated symptoms: no fever and no headaches   Right upper quadrant dental pain ongoing woke today with right-sided facial swelling up to her lower eyelid Felt hot last p.m. but no documented fever Denies ear pain, headache, sore throat, difficulty swallowing, difficulty breathing, swollen glands  Past Medical History:  Diagnosis Date   Acute respiratory failure with hypoxia (HCC) 10/16/2017   ARDS (adult respiratory distress syndrome) (HCC) 10/19/2017   Heart failure (HCC)    Multifocal pneumonia 10/16/2017   Sepsis (HCC) 10/16/2017    Patient Active Problem List   Diagnosis Date Noted   Prediabetes 02/05/2023   Depression, major, single episode, mild (HCC) 02/05/2023   Moderate persistent asthma 12/21/2022   Fatty liver disease, nonalcoholic 12/21/2022   Dyspnea 40/98/1191   History of MRSA infection 03/14/2018   Morbid obesity (HCC) 12/22/2017   Tobacco use 12/22/2017   Intramural uterine fibroid - Retroplacental  12/22/2017   Trichomonal vaginitis during pregnancy in second trimester 11/15/2017    Past Surgical History:  Procedure Laterality Date   NO PAST SURGERIES      OB History     Gravida  1   Para  1   Term  1   Preterm      AB      Living  1      SAB      IAB      Ectopic      Multiple  0   Live Births  1            Home Medications    Prior to Admission medications   Medication Sig Start Date End Date Taking? Authorizing Provider  acetaminophen (TYLENOL) 500 MG tablet Take 500 mg by mouth every 4 (four) hours as needed for mild pain.   Yes [provider]  albuterol (PROVENTIL) (2.5 MG/3ML) 0.083% nebulizer solution Take 3 mLs (2.5 mg  total) by nebulization every 6 (six) hours as needed for wheezing or shortness of breath. 08/09/22  Yes Claiborne Rigg, NP  albuterol (VENTOLIN HFA) 108 (90 Base) MCG/ACT inhaler Inhale 2 puffs into the lungs every 4 (four) hours as needed for wheezing or shortness of breath. 12/07/22  Yes Karie Georges, MD  amoxicillin-clavulanate (AUGMENTIN) 875-125 MG tablet Take 1 tablet by mouth every 12 (twelve) hours for 10 days. 02/10/23 02/20/23 Yes Glessie Eustice, Marylene Land, PA  budesonide-formoterol (SYMBICORT) 160-4.5 MCG/ACT inhaler Inhale 2 puffs into the lungs 2 (two) times daily. 12/07/22  Yes Karie Georges, MD  buPROPion (WELLBUTRIN XL) 150 MG 24 hr tablet Take 1 tablet (150 mg total) by mouth daily. 02/05/23  Yes Karie Georges, MD  furosemide (LASIX) 20 MG tablet Take 1 tablet (20 mg total) by mouth daily. 09/21/22  Yes Maisie Fus, MD  Loratadine (CLARITIN PO) Take by mouth daily.   Yes [provider]  montelukast (SINGULAIR) 10 MG tablet Take 1 tablet (10 mg total) by mouth at bedtime. 02/05/23  Yes Karie Georges, MD  Semaglutide-Weight Management [facility]) 0.25 MG/0.5ML SOAJ Inject 0.25 mg into the skin once a week. 02/05/23  Yes Karie Georges, MD  Semaglutide-Weight Management [facility]) 0.5 MG/0.5ML [place]  Inject 0.5 mg into the skin once a week. 03/01/23  Yes Karie Georges, MD  Semaglutide-Weight Management [facility]) 1 MG/0.5ML SOAJ Inject 1 mg into the skin once a week. 04/01/23  Yes Karie Georges, MD  varenicline (CHANTIX) 1 MG tablet Take 1 tablet (1 mg total) by mouth 2 (two) times daily. 12/07/22  Yes Karie Georges, MD  Varenicline Tartrate, Starter, (CHANTIX STARTING MONTH PAK) 0.5 MG X 11 & 1 MG X 42 TBPK Take one 0.5 mg tablet by mouth once daily for 3 days, then increase to one 0.5 mg tablet twice daily for 4 days, then increase to one 1 mg tablet twice daily. 12/07/22  Yes Karie Georges, MD    Family History Family History  Problem Relation Age  of Onset   Asthma Mother    Asthma Brother     Social History Social History   Tobacco Use   Smoking status: Every Day    Current packs/day: 0.50    Types: Cigarettes   Smokeless tobacco: Never  Vaping Use   Vaping status: Never Used  Substance Use Topics   Alcohol use: Yes    Comment: occ    Drug use: Not Currently    Types: Cocaine    Comment: years ago     Allergies   Patient has no known allergies.   Review of Systems Review of Systems  Constitutional:  Negative for appetite change and fever.  HENT:  Positive for dental problem and facial swelling. Negative for ear pain, trouble swallowing and voice change.   Gastrointestinal:  Negative for nausea and vomiting.  Neurological:  Negative for headaches.     Physical Exam Triage Vital Signs ED Triage Vitals  Encounter Vitals Group     BP 02/10/23 1213 136/80     Systolic BP Percentile --      Diastolic BP Percentile --      Pulse Rate 02/10/23 1213 84     Resp 02/10/23 1213 20     Temp 02/10/23 1213 98.3 F (36.8 C)     Temp Source 02/10/23 1213 Oral     SpO2 02/10/23 1213 97 %     Weight --      Height --      Head Circumference --      Peak Flow --      Pain Score 02/10/23 1211 6     Pain Loc --      Pain Education --      Exclude from Growth Chart --    No data found.  Updated Vital Signs BP 136/80 (BP Location: Left Arm)   Pulse 84   Temp 98.3 F (36.8 C) (Oral)   Resp 20   LMP 01/08/2023 (Approximate)   SpO2 97%   Visual Acuity Right Eye Distance:   Left Eye Distance:   Bilateral Distance:    Right Eye Near:   Left Eye Near:    Bilateral Near:     Physical Exam Vitals and nursing note reviewed.  Constitutional:      Appearance: She is not ill-appearing.  HENT:     Head: Normocephalic.     Comments: Swelling right cheek mild swelling right lower eyelid    Right Ear: Tympanic membrane and ear canal normal.     Left Ear: Tympanic membrane normal.     Nose: No rhinorrhea.      Mouth/Throat:     Mouth: Mucous membranes are moist.     Comments: Multiple  deep caries, tender right upper quadrant gingival swelling no active drainage Swallowing, clear voice, no trismus Eyes:     Conjunctiva/sclera: Conjunctivae normal.  Cardiovascular:     Rate and Rhythm: Normal rate.     Heart sounds: Normal heart sounds.  Pulmonary:     Effort: Pulmonary effort is normal.     Breath sounds: Normal breath sounds.  Musculoskeletal:     Cervical back: Neck supple.  Lymphadenopathy:     Cervical: No cervical adenopathy.  Skin:    General: Skin is warm.  Neurological:     Mental Status: She is alert.      UC Treatments / Results  Labs (all labs ordered are listed, but only abnormal results are displayed) Labs Reviewed - No data to display  EKG   Radiology No results found.  Procedures Procedures (including critical care time)  Medications Ordered in UC Medications - No data to display  Initial Impression / Assessment and Plan / UC Course  I have reviewed the triage vital signs and the nursing notes.  Pertinent labs & imaging results that were available during my care of the patient were reviewed by me and considered in my medical decision making (see chart for details).     42 year old female with facial swelling and dental abscess will start on antibiotics, strict ED precautions given the patient go to ED for worsening symptoms development of fever, inability to open mouth, difficulty swallowing or difficulty breathing.  Encouraged to follow-up with her dentist to soon as possible, AC ibuprofen for pain and swelling Final Clinical Impressions(s) / UC Diagnoses   Final diagnoses:  Dental abscess  Facial swelling     Discharge Instructions      Up with your dentist to soon as possible Go to the emergency department for worsening symptoms or development of new symptoms such as fever, inability to open your mouth, swelling of your neck, difficulty  breathing or difficulty swallowing   ED Prescriptions     Medication Sig Dispense Auth. Provider   amoxicillin-clavulanate (AUGMENTIN) 875-125 MG tablet Take 1 tablet by mouth every 12 (twelve) hours for 10 days. 20 tablet Meliton Rattan, [place]      PDMP not reviewed this encounter.   Meliton Rattan, [place] 02/10/23 1231

## 2023-02-10 NOTE — ED Triage Notes (Signed)
Right upper tooth pain x 3 days. Facial swelling today

## 2023-02-10 NOTE — Discharge Instructions (Addendum)
Up with your dentist to soon as possible Go to the emergency department for worsening symptoms or development of new symptoms such as fever, inability to open your mouth, swelling of your neck, difficulty breathing or difficulty swallowing

## 2023-02-11 ENCOUNTER — Other Ambulatory Visit (HOSPITAL_COMMUNITY): Payer: Self-pay

## 2023-02-12 ENCOUNTER — Other Ambulatory Visit (HOSPITAL_BASED_OUTPATIENT_CLINIC_OR_DEPARTMENT_OTHER): Payer: Self-pay

## 2023-02-12 ENCOUNTER — Other Ambulatory Visit (HOSPITAL_COMMUNITY): Payer: Self-pay

## 2023-02-18 ENCOUNTER — Other Ambulatory Visit (HOSPITAL_BASED_OUTPATIENT_CLINIC_OR_DEPARTMENT_OTHER): Payer: Self-pay

## 2023-02-19 ENCOUNTER — Other Ambulatory Visit (HOSPITAL_BASED_OUTPATIENT_CLINIC_OR_DEPARTMENT_OTHER): Payer: Self-pay

## 2023-02-19 ENCOUNTER — Encounter: Payer: Self-pay | Admitting: Family Medicine

## 2023-02-20 ENCOUNTER — Other Ambulatory Visit (HOSPITAL_BASED_OUTPATIENT_CLINIC_OR_DEPARTMENT_OTHER): Payer: Self-pay

## 2023-02-22 ENCOUNTER — Other Ambulatory Visit (HOSPITAL_COMMUNITY): Payer: Self-pay

## 2023-02-22 NOTE — Telephone Encounter (Signed)
Can you send this to the prior auth people? I'm not sure why these folks are getting denied when it is clear that they meet the criteria-=- please ask them to send in the prior auth on paper rather than using the computer system

## 2023-02-23 ENCOUNTER — Other Ambulatory Visit (HOSPITAL_COMMUNITY): Payer: Self-pay

## 2023-02-25 NOTE — Telephone Encounter (Signed)
 Ok, I was told by the [facility] rep that the online tools are incorrect and it was denying people that should have gotten approved-- that if you send It in on paper via fax it is more likely to be approved by medicaid.

## 2023-02-28 ENCOUNTER — Other Ambulatory Visit: Payer: Self-pay | Admitting: Family Medicine

## 2023-02-28 DIAGNOSIS — J209 Acute bronchitis, unspecified: Secondary | ICD-10-CM

## 2023-02-28 DIAGNOSIS — F320 Major depressive disorder, single episode, mild: Secondary | ICD-10-CM

## 2023-03-02 ENCOUNTER — Other Ambulatory Visit (HOSPITAL_COMMUNITY): Payer: Self-pay

## 2023-03-03 NOTE — Telephone Encounter (Signed)
 Can we get an update to see if the wegovy was approved?

## 2023-03-04 ENCOUNTER — Other Ambulatory Visit (HOSPITAL_COMMUNITY): Payer: Self-pay

## 2023-03-04 NOTE — Telephone Encounter (Signed)
 Called ins to follow up on PA request. PA is still pending review. They have noted that a determination will be made by January 15th. Pt did not qualify for expedited appeal.   Appeal # [id number], call ref # [id number]

## 2023-03-09 ENCOUNTER — Other Ambulatory Visit (HOSPITAL_BASED_OUTPATIENT_CLINIC_OR_DEPARTMENT_OTHER): Payer: Self-pay

## 2023-03-09 ENCOUNTER — Other Ambulatory Visit (HOSPITAL_COMMUNITY): Payer: Self-pay

## 2023-03-09 NOTE — Telephone Encounter (Signed)
 Pharmacy Patient Advocate Encounter  Received notification from [employer] that the appeal for Wegovy  0.25MG /0.5ML  has been APPROVED from 03/09/2023 to 09/05/2023   Thank you, Devere Pandy, PharmD Clinical Pharmacist  [place]  Direct Dial: [phone]

## 2023-03-09 NOTE — Telephone Encounter (Signed)
 Spoke with Colin Mulders at the [facility] and informed her of the approval as below.

## 2023-04-13 ENCOUNTER — Other Ambulatory Visit (HOSPITAL_BASED_OUTPATIENT_CLINIC_OR_DEPARTMENT_OTHER): Payer: Self-pay

## 2023-05-04 ENCOUNTER — Other Ambulatory Visit: Payer: Self-pay | Admitting: Family Medicine

## 2023-05-04 DIAGNOSIS — J4540 Moderate persistent asthma, uncomplicated: Secondary | ICD-10-CM

## 2023-05-06 ENCOUNTER — Ambulatory Visit: Payer: Medicaid Other | Admitting: Family Medicine

## 2023-05-25 ENCOUNTER — Encounter: Payer: Self-pay | Admitting: Family Medicine

## 2023-05-25 MED ORDER — WEGOVY 0.5 MG/0.5ML ~~LOC~~ SOAJ
0.5000 mg | SUBCUTANEOUS | 0 refills | Status: DC
Start: 2023-05-25 — End: 2023-09-14

## 2023-06-09 ENCOUNTER — Encounter: Payer: Self-pay | Admitting: Family Medicine

## 2023-06-09 NOTE — Telephone Encounter (Signed)
**Note De-identified  [place] Obfuscation** Please advise 

## 2023-06-18 ENCOUNTER — Encounter: Payer: Self-pay | Admitting: Family Medicine

## 2023-07-23 ENCOUNTER — Ambulatory Visit: Payer: Self-pay

## 2023-07-24 ENCOUNTER — Ambulatory Visit: Payer: Self-pay

## 2023-07-28 ENCOUNTER — Other Ambulatory Visit: Payer: Self-pay

## 2023-07-28 ENCOUNTER — Ambulatory Visit
Admission: RE | Admit: 2023-07-28 | Discharge: 2023-07-28 | Disposition: A | Discharge status: Home / Self Care | Source: Ambulatory Visit | Attending: Physician Assistant | Admitting: Physician Assistant

## 2023-07-28 VITALS — BP 117/77 | HR 86 | Temp 97.7°F | Resp 20 | Ht 68.5 in | Wt 275.0 lb

## 2023-07-28 DIAGNOSIS — K640 First degree hemorrhoids: Secondary | ICD-10-CM

## 2023-07-28 MED ORDER — LIDOCAINE-HYDROCORTISONE ACE 2-2 % RE KIT: 1.0000 | PACK | Freq: Two times a day (BID) | RECTAL | 0 refills | Status: AC | PRN

## 2023-07-28 NOTE — ED Triage Notes (Signed)
 Pt presents with complaints of hemorrhoids x 3 weeks. Pt states she was constipated however this has now resolved. Suppositories, ointment, and Ibuprofen  at home with little to no relief. Pt currently rates her overall pain an 8/10. Unable to sleep at night due to the pain.

## 2023-07-28 NOTE — Discharge Instructions (Addendum)
 You were seen today for concerns of hemorrhoids.  At this time it appears that you have healing hemorrhoids along the external rectal space. To help manage this I recommend some home measures such as warm sitz bath's, increasing your fiber, exercise and staying well-hydrated. As needed you can use Preparation H ointment, Tylenol  and ibuprofen  for pain management. I have sent in a lidocaine -hydrocortisone rectal cream for you to use as needed to assist with pain.  You can use this twice per day for up to 7 days.  Please do not use any longer than a week as this can cause side effects and skin changes. If your symptoms are not improving over the next 1 to 2 weeks or seem to be getting worse I recommend following up with your primary care provider as you may need a referral to general surgery for further evaluation and management.

## 2023-07-28 NOTE — ED Provider Notes (Signed)
 [facility]    CSN: [id number] Arrival date & time: 07/28/23  1008      History   Chief Complaint Chief Complaint  Patient presents with   Hemorrhoids    Entered by patient    HPI Catherine Cannon is a 43 y.o. female.   HPI Pt reports concerns for hemorrhoids  She states about 3 weeks ago she became constipated and developed painful hemorrhoids  She reports constipation has resolved She states she did initially have some bright red rectal bleeding but this has not been present for the past few days She reports pain with bowel movements and states she has constant rectal pain that is keeping her awake at night Interventions: Ibuprofen , hemorrhoid suppositories, A&D ointment topically    Past Medical History:  Diagnosis Date   Acute respiratory failure with hypoxia (HCC) 10/16/2017   ARDS (adult respiratory distress syndrome) (HCC) 10/19/2017   Heart failure (HCC)    Multifocal pneumonia 10/16/2017   Sepsis (HCC) 10/16/2017    Patient Active Problem List   Diagnosis Date Noted   Prediabetes 02/05/2023   Depression, major, single episode, mild (HCC) 02/05/2023   Moderate persistent asthma 12/21/2022   Fatty liver disease, nonalcoholic 12/21/2022   Dyspnea 40/98/1191   History of MRSA infection 03/14/2018   Morbid obesity (HCC) 12/22/2017   Tobacco use 12/22/2017   Intramural uterine fibroid - Retroplacental  12/22/2017   Trichomonal vaginitis during pregnancy in second trimester 11/15/2017    Past Surgical History:  Procedure Laterality Date   NO PAST SURGERIES      OB History     Gravida  1   Para  1   Term  1   Preterm      AB      Living  1      SAB      IAB      Ectopic      Multiple  0   Live Births  1            Home Medications    Prior to Admission medications   Medication Sig Start Date End Date Taking? Authorizing Provider  Lidocaine -Hydrocortisone Ace 2-2 % KIT Place 1 Application rectally 2 (two)  times daily as needed for up to 7 days. 07/28/23 08/04/23 Yes Andranik Jeune E, PA-C  acetaminophen  (TYLENOL ) 500 MG tablet Take 500 mg by mouth every 4 (four) hours as needed for mild pain.    [provider]  albuterol  (PROVENTIL ) (2.5 MG/3ML) 0.083% nebulizer solution Take 3 mLs (2.5 mg total) by nebulization every 6 (six) hours as needed for wheezing or shortness of breath. 08/09/22   Fleming, Zelda W, NP  budesonide -formoterol  (SYMBICORT ) 160-4.5 MCG/ACT inhaler Inhale 2 puffs into the lungs 2 (two) times daily. 12/07/22   Aida House, MD  buPROPion  (WELLBUTRIN  XL) 150 MG 24 hr tablet TAKE 1 TABLET BY MOUTH EVERY DAY 03/01/23   Aida House, MD  furosemide  (LASIX ) 20 MG tablet Take 1 tablet (20 mg total) by mouth daily. 09/21/22   Bridgette Campus, MD  Loratadine (CLARITIN PO) Take by mouth daily.    [provider]  montelukast  (SINGULAIR ) 10 MG tablet TAKE 1 TABLET BY MOUTH EVERYDAY AT BEDTIME 05/04/23   Aida House, MD  Semaglutide -Weight Management (WEGOVY ) 0.5 MG/0.5ML SOAJ Inject 0.5 mg into the skin once a week. 05/25/23   Aida House, MD  Semaglutide -Weight Management (WEGOVY ) 1 MG/0.5ML SOAJ Inject 1 mg into the skin once  a week. 04/01/23   Aida House, MD  varenicline  (CHANTIX ) 1 MG tablet Take 1 tablet (1 mg total) by mouth 2 (two) times daily. 12/07/22   Aida House, MD  Varenicline  Tartrate, Starter, (CHANTIX  STARTING MONTH PAK) 0.5 MG X 11 & 1 MG X 42 TBPK Take one 0.5 mg tablet by mouth once daily for 3 days, then increase to one 0.5 mg tablet twice daily for 4 days, then increase to one 1 mg tablet twice daily. 12/07/22   Aida House, MD  VENTOLIN  HFA 108 [phone] Base) MCG/ACT inhaler INHALE 2 PUFFS INTO THE LUNGS EVERY 4 HOURS AS NEEDED FOR WHEEZING OR SHORTNESS OF BREATH. 03/01/23   Aida House, MD    Family History Family History  Problem Relation Age of Onset   Asthma Mother    Asthma Brother     Social History Social  History   Tobacco Use   Smoking status: Every Day    Current packs/day: 0.50    Types: Cigarettes   Smokeless tobacco: Never  Vaping Use   Vaping status: Never Used  Substance Use Topics   Alcohol use: Yes    Comment: occ    Drug use: Not Currently    Types: Cocaine    Comment: years ago     Allergies   Patient has no known allergies.   Review of Systems Review of Systems  Gastrointestinal:  Positive for anal bleeding, blood in stool and rectal pain.     Physical Exam Triage Vital Signs ED Triage Vitals  Encounter Vitals Group     BP 07/28/23 1024 117/77     Systolic BP Percentile --      Diastolic BP Percentile --      Pulse Rate 07/28/23 1024 86     Resp 07/28/23 1024 20     Temp 07/28/23 1024 97.7 F (36.5 C)     Temp Source 07/28/23 1024 Oral     SpO2 07/28/23 1024 94 %     Weight 07/28/23 1027 275 lb (124.7 kg)     Height 07/28/23 1027 5' 8.5" (1.74 m)     Head Circumference --      Peak Flow --      Pain Score 07/28/23 1027 8     Pain Loc --      Pain Education --      Exclude from Growth Chart --    No data found.  Updated Vital Signs BP 117/77 (BP Location: Right Arm)   Pulse 86   Temp 97.7 F (36.5 C) (Oral)   Resp 20   Ht 5' 8.5" (1.74 m)   Wt 275 lb (124.7 kg)   LMP 07/14/2023 (Exact Date)   SpO2 94%   BMI 41.21 kg/m   Visual Acuity Right Eye Distance:   Left Eye Distance:   Bilateral Distance:    Right Eye Near:   Left Eye Near:    Bilateral Near:     Physical Exam Vitals reviewed.  Constitutional:      General: She is awake. She is not in acute distress.    Appearance: Normal appearance. She is well-developed and well-groomed. She is not ill-appearing or toxic-appearing.  HENT:     Head: Normocephalic and atraumatic.  Pulmonary:     Effort: Pulmonary effort is normal.  Genitourinary:    Rectum: Tenderness and external hemorrhoid present. No mass or anal fissure.     Comments: Pt has evidence of external hemorrhoids that  are not currently thrombosed or extensively inflamed. No obvious signs of perianal abscess today  She declines chaperone for rectal exam Neurological:     General: No focal deficit present.     Mental Status: She is alert and oriented to person, place, and time.  Psychiatric:        Mood and Affect: Mood normal.        Behavior: Behavior normal. Behavior is cooperative.      UC Treatments / Results  Labs (all labs ordered are listed, but only abnormal results are displayed) Labs Reviewed - No data to display  EKG   Radiology No results found.  Procedures Procedures (including critical care time)  Medications Ordered in UC Medications - No data to display  Initial Impression / Assessment and Plan / UC Course  I have reviewed the triage vital signs and the nursing notes.  Pertinent labs & imaging results that were available during my care of the patient were reviewed by me and considered in my medical decision making (see chart for details).      Final Clinical Impressions(s) / UC Diagnoses   Final diagnoses:  Grade I hemorrhoids   Patient presents today with concerns for hemorrhoids.  She reports that several weeks ago she was having consistent constipation and thinks this caused current hemorrhoids.  She reports that she did initially have some bright red blood from the rectum but this has not been present for several days.  She does report significant pain and discomfort which is causing loss of sleep.  Physical exam is notable for mild grade 1 external hemorrhoids without signs of thrombosis or significant inflammation.  No signs of obvious perianal abscess.  Given patient's symptoms we will try topical lidocaine -hydrocortisone cream to assist with symptoms.  Recommend continued oral pain medication such as Tylenol  and ibuprofen .  Recommended sitz bath as well as dietary changes to further assist with resolution and prevention.  Reviewed with patient that if her symptoms  are not improving or seem to be getting worse over the next 1 to 2 weeks she should follow-up with PCP for potential referral to general surgery.  Patient voiced agreement and understanding with recommendations.    Discharge Instructions      You were seen today for concerns of hemorrhoids.  At this time it appears that you have healing hemorrhoids along the external rectal space. To help manage this I recommend some home measures such as warm sitz bath's, increasing your fiber, exercise and staying well-hydrated. As needed you can use Preparation H ointment, Tylenol  and ibuprofen  for pain management. I have sent in a lidocaine -hydrocortisone rectal cream for you to use as needed to assist with pain.  You can use this twice per day for up to 7 days.  Please do not use any longer than a week as this can cause side effects and skin changes. If your symptoms are not improving over the next 1 to 2 weeks or seem to be getting worse I recommend following up with your primary care provider as you may need a referral to general surgery for further evaluation and management.   ED Prescriptions     Medication Sig Dispense Auth. Provider   Lidocaine -Hydrocortisone Ace 2-2 % KIT Place 1 Application rectally 2 (two) times daily as needed for up to 7 days. 1 kit Faduma Cho E, PA-C      PDMP not reviewed this encounter.   Jerona Mooring, PA-C 07/28/23 1110

## 2023-08-04 ENCOUNTER — Encounter: Payer: Self-pay | Admitting: Family Medicine

## 2023-08-05 MED ORDER — WEGOVY 0.5 MG/0.5ML ~~LOC~~ SOAJ: 0.5000 mg | SUBCUTANEOUS | 0 refills | Status: DC

## 2023-08-08 ENCOUNTER — Ambulatory Visit
Admission: EM | Admit: 2023-08-08 | Discharge: 2023-08-08 | Disposition: A | Discharge status: Home / Self Care | Attending: Physician Assistant | Admitting: Physician Assistant

## 2023-08-08 ENCOUNTER — Encounter: Payer: Self-pay | Admitting: Emergency Medicine

## 2023-08-08 DIAGNOSIS — R21 Rash and other nonspecific skin eruption: Secondary | ICD-10-CM

## 2023-08-08 MED ORDER — DOXYCYCLINE HYCLATE 100 MG PO CAPS: 100.0000 mg | ORAL_CAPSULE | Freq: Two times a day (BID) | ORAL | 0 refills | Status: AC

## 2023-08-08 MED ORDER — TRIAMCINOLONE ACETONIDE 0.1 % EX CREA: 1.0000 | TOPICAL_CREAM | Freq: Two times a day (BID) | CUTANEOUS | 0 refills | Status: AC

## 2023-08-08 NOTE — ED Triage Notes (Signed)
 Pt presents c/o rash on left lower leg since Thursday. Pt says the rash started smaller on just the left leg then got bigger, more redness, and spread to the right leg as well. Pt says the are was just itchy at first but now has a little pain with it.

## 2023-08-08 NOTE — ED Provider Notes (Signed)
 [facility]    CSN: [id number] Arrival date & time: 08/08/23  1104      History   Chief Complaint Chief Complaint  Patient presents with   Rash    HPI Catherine Cannon is a 43 y.o. female.   Patient presents today for evaluation of rash to her bilateral lower legs.  She reports that initially she had a mild rash to her right medial ankle area that is just progressively worse and is now spread to her left lower leg.  She does spend a lot of time outside.  She states that rash was itchy but is now somewhat painful as well.  She does not report any fever.  She tried using topical Benadryl  without improvement.  The history is provided by the patient.    Past Medical History:  Diagnosis Date   Acute respiratory failure with hypoxia (HCC) 10/16/2017   ARDS (adult respiratory distress syndrome) (HCC) 10/19/2017   Heart failure (HCC)    Multifocal pneumonia 10/16/2017   Sepsis (HCC) 10/16/2017    Patient Active Problem List   Diagnosis Date Noted   Prediabetes 02/05/2023   Depression, major, single episode, mild (HCC) 02/05/2023   Moderate persistent asthma 12/21/2022   Fatty liver disease, nonalcoholic 12/21/2022   Dyspnea 30/86/5784   History of MRSA infection 03/14/2018   Morbid obesity (HCC) 12/22/2017   Tobacco use 12/22/2017   Intramural uterine fibroid - Retroplacental  12/22/2017   Trichomonal vaginitis during pregnancy in second trimester 11/15/2017    Past Surgical History:  Procedure Laterality Date   NO PAST SURGERIES      OB History     Gravida  1   Para  1   Term  1   Preterm      AB      Living  1      SAB      IAB      Ectopic      Multiple  0   Live Births  1            Home Medications    Prior to Admission medications   Medication Sig Start Date End Date Taking? Authorizing Provider  doxycycline  (VIBRAMYCIN ) 100 MG capsule Take 1 capsule (100 mg total) by mouth 2 (two) times daily for 7 days. 08/08/23  08/15/23 Yes Vernestine Gondola, PA-C  triamcinolone cream (KENALOG) 0.1 % Apply 1 Application topically 2 (two) times daily. 08/08/23  Yes Vernestine Gondola, PA-C  acetaminophen  (TYLENOL ) 500 MG tablet Take 500 mg by mouth every 4 (four) hours as needed for mild pain.    [provider]  albuterol  (PROVENTIL ) (2.5 MG/3ML) 0.083% nebulizer solution Take 3 mLs (2.5 mg total) by nebulization every 6 (six) hours as needed for wheezing or shortness of breath. 08/09/22   Fleming, Zelda W, NP  budesonide -formoterol  (SYMBICORT ) 160-4.5 MCG/ACT inhaler Inhale 2 puffs into the lungs 2 (two) times daily. 12/07/22   Aida House, MD  buPROPion  (WELLBUTRIN  XL) 150 MG 24 hr tablet TAKE 1 TABLET BY MOUTH EVERY DAY 03/01/23   Aida House, MD  furosemide  (LASIX ) 20 MG tablet Take 1 tablet (20 mg total) by mouth daily. 09/21/22   Bridgette Campus, MD  Loratadine (CLARITIN PO) Take by mouth daily.    [provider]  montelukast  (SINGULAIR ) 10 MG tablet TAKE 1 TABLET BY MOUTH EVERYDAY AT BEDTIME 05/04/23   Aida House, MD  Semaglutide -Weight Management (WEGOVY ) 0.5 MG/0.5ML SOAJ Inject 0.5  mg into the skin once a week. 05/25/23   Aida House, MD  Semaglutide -Weight Management (WEGOVY ) 0.5 MG/0.5ML SOAJ Inject 0.5 mg into the skin once a week. 08/05/23   Nafziger, Randel Buss, NP  Semaglutide -Weight Management (WEGOVY ) 1 MG/0.5ML SOAJ Inject 1 mg into the skin once a week. 04/01/23   Aida House, MD  varenicline  (CHANTIX ) 1 MG tablet Take 1 tablet (1 mg total) by mouth 2 (two) times daily. 12/07/22   Aida House, MD  Varenicline  Tartrate, Starter, (CHANTIX  STARTING MONTH PAK) 0.5 MG X 11 & 1 MG X 42 TBPK Take one 0.5 mg tablet by mouth once daily for 3 days, then increase to one 0.5 mg tablet twice daily for 4 days, then increase to one 1 mg tablet twice daily. 12/07/22   Aida House, MD  VENTOLIN  HFA 108 [phone] Base) MCG/ACT inhaler INHALE 2 PUFFS INTO THE LUNGS EVERY 4 HOURS AS  NEEDED FOR WHEEZING OR SHORTNESS OF BREATH. 03/01/23   Aida House, MD    Family History Family History  Problem Relation Age of Onset   Asthma Mother    Asthma Brother     Social History Social History   Tobacco Use   Smoking status: Every Day    Current packs/day: 0.50    Types: Cigarettes    Passive exposure: Current   Smokeless tobacco: Never  Vaping Use   Vaping status: Never Used  Substance Use Topics   Alcohol use: Yes    Comment: occ    Drug use: Not Currently    Types: Cocaine    Comment: years ago     Allergies   Patient has no known allergies.   Review of Systems Review of Systems  Constitutional:  Negative for chills and fever.  Eyes:  Negative for discharge and redness.  Respiratory:  Negative for shortness of breath.   Gastrointestinal:  Negative for abdominal pain, nausea and vomiting.  Skin:  Positive for rash.     Physical Exam Triage Vital Signs ED Triage Vitals  Encounter Vitals Group     BP      Girls Systolic BP Percentile      Girls Diastolic BP Percentile      Boys Systolic BP Percentile      Boys Diastolic BP Percentile      Pulse      Resp      Temp      Temp src      SpO2      Weight      Height      Head Circumference      Peak Flow      Pain Score      Pain Loc      Pain Education      Exclude from Growth Chart    No data found.  Updated Vital Signs BP 138/86 (BP Location: Left Arm)   Pulse 81   Temp 98 F (36.7 C) (Oral)   Resp 18   Wt 274 lb 14.6 oz (124.7 kg)   LMP 07/14/2023 (Exact Date)   SpO2 98%   BMI 41.19 kg/m   Visual Acuity Right Eye Distance:   Left Eye Distance:   Bilateral Distance:    Right Eye Near:   Left Eye Near:    Bilateral Near:     Physical Exam Vitals and nursing note reviewed.  Constitutional:      General: She is not in acute distress.    Appearance:  Normal appearance. She is not ill-appearing.  HENT:     Head: Normocephalic and atraumatic.   Eyes:      Conjunctiva/sclera: Conjunctivae normal.    Cardiovascular:     Rate and Rhythm: Normal rate.  Pulmonary:     Effort: Pulmonary effort is normal. No respiratory distress.   Skin:    Comments: Rash noted to lower legs- see photos   Neurological:     Mental Status: She is alert.   Psychiatric:        Mood and Affect: Mood normal.        Behavior: Behavior normal.        Thought Content: Thought content normal.           UC Treatments / Results  Labs (all labs ordered are listed, but only abnormal results are displayed) Labs Reviewed - No data to display  EKG   Radiology No results found.  Procedures Procedures (including critical care time)  Medications Ordered in UC Medications - No data to display  Initial Impression / Assessment and Plan / UC Course  I have reviewed the triage vital signs and the nursing notes.  Pertinent labs & imaging results that were available during my care of the patient were reviewed by me and considered in my medical decision making (see chart for details).    Suspect likely contact dermatitis and will treat with topical steroid.  Given significant erythema that has developed to right lower leg as well as continued spread will also treat with doxycycline  to cover possible developing cellulitis.  Encouraged follow-up if no gradual improvement or with any worsening symptoms.  Final Clinical Impressions(s) / UC Diagnoses   Final diagnoses:  Rash and nonspecific skin eruption   Discharge Instructions   None    ED Prescriptions     Medication Sig Dispense Auth. Provider   doxycycline  (VIBRAMYCIN ) 100 MG capsule Take 1 capsule (100 mg total) by mouth 2 (two) times daily for 7 days. 14 capsule Sondra Blixt F, PA-C   triamcinolone cream (KENALOG) 0.1 % Apply 1 Application topically 2 (two) times daily. 30 g Vernestine Gondola, PA-C      PDMP not reviewed this encounter.   Vernestine Gondola, PA-C 08/08/23 1131

## 2023-08-09 ENCOUNTER — Ambulatory Visit

## 2023-08-09 ENCOUNTER — Encounter: Payer: Self-pay | Admitting: Family Medicine

## 2023-08-10 ENCOUNTER — Ambulatory Visit: Payer: Self-pay

## 2023-08-10 ENCOUNTER — Telehealth: Admitting: Physician Assistant

## 2023-08-10 DIAGNOSIS — R21 Rash and other nonspecific skin eruption: Secondary | ICD-10-CM | POA: Diagnosis not present

## 2023-08-10 MED ORDER — PREDNISONE 20 MG PO TABS: 40.0000 mg | ORAL_TABLET | Freq: Every day | ORAL | 0 refills | Status: AC

## 2023-08-10 NOTE — Telephone Encounter (Signed)
  FYI Only or Action Required?: FYI only for provider  Patient was last seen in primary care on 02/05/2023 by Aida House, MD. Called Nurse Triage reporting Rash. Symptoms began several days ago. Interventions attempted: Prescription medications: Doxycyline, steroid cream. Symptoms are: gradually worsening.  Triage Disposition: See PCP When Office is Open (Within 3 Days)  Patient/caregiver understands and will follow disposition?: Yes  **Virtual UC visit scheduled today per patient request.             Copied from CRM [id number]. Topic: Clinical - Red Word Triage >> Aug 10, 2023  4:18 PM Dewanda Foots wrote: Red Word that prompted transfer to Nurse Triage: Pt states she has a rash on both legs began on right leg and now on both, and getting worse.  Redness and swelling  Wanting virtual appt but cannot schedule because of symptoms.  Reason for Disposition  Mild widespread rash  (Exception: Heat rash lasting 3 days or less.)  Answer Assessment - Initial Assessment Questions 1. APPEARANCE of RASH: Describe the rash. (e.g., spots, blisters, raised areas, skin peeling, scaly)     Dark red areas, slightly raised.  2. SIZE: How big are the spots? (e.g., tip of pen, eraser, coin; inches, centimeters)     Sizes vary, size of a hand, golf ball sizes 3. LOCATION: Where is the rash located?     Right leg, left leg, around the shins  4. COLOR: What color is the rash? (Note: It is difficult to assess rash color in people with darker-colored skin. When this situation occurs, simply ask the caller to describe what they see.)     Dark red/ purple   5. ONSET: When did the rash begin?     Last Friday.   6. FEVER: Do you have a fever? If Yes, ask: What is your temperature, how was it measured, and when did it start?     No  7. ITCHING: Does the rash itch? If Yes, ask: How bad is the itch? (Scale 1-10; or mild, moderate, severe)     Painful, mild itching on edge.   8.  CAUSE: What do you think is causing the rash?     Unknown  9. MEDICINE FACTORS: Have you started any new medicines within the last 2 weeks? (e.g., antibiotics)      Doxycyline  10. OTHER SYMPTOMS: Do you have any other symptoms? (e.g., dizziness, headache, sore throat, joint pain)       No    She was prescribed doxycyline and a steroid cream in the UC. Symptoms persist/ rash is spreading.  Protocols used: Rash or Redness - [facility]

## 2023-08-10 NOTE — Patient Instructions (Signed)
 Catherine Cannon, thank you for joining Char Common Ward, PA-C for today's virtual visit.  While this provider is not your primary care provider (PCP), if your PCP is located in our provider database this encounter information will be shared with them immediately following your visit.   A [facility] MyChart account gives you access to today's visit and all your visits, tests, and labs performed at [facility]  click here if you don't have a [facility] MyChart account or go to [email or web address]  Consent: (Patient) Catherine Cannon provided verbal consent for this virtual visit at the beginning of the encounter.  Current Medications:  Current Outpatient Medications:    predniSONE  (DELTASONE ) 20 MG tablet, Take 2 tablets (40 mg total) by mouth daily with breakfast for 5 days., Disp: 10 tablet, Rfl: 0   acetaminophen  (TYLENOL ) 500 MG tablet, Take 500 mg by mouth every 4 (four) hours as needed for mild pain., Disp: , Rfl:    albuterol  (PROVENTIL ) (2.5 MG/3ML) 0.083% nebulizer solution, Take 3 mLs (2.5 mg total) by nebulization every 6 (six) hours as needed for wheezing or shortness of breath., Disp: 75 mL, Rfl: 12   budesonide -formoterol  (SYMBICORT ) 160-4.5 MCG/ACT inhaler, Inhale 2 puffs into the lungs 2 (two) times daily., Disp: 10.2 g, Rfl: 3   buPROPion  (WELLBUTRIN  XL) 150 MG 24 hr tablet, TAKE 1 TABLET BY MOUTH EVERY DAY, Disp: 90 tablet, Rfl: 2   doxycycline  (VIBRAMYCIN ) 100 MG capsule, Take 1 capsule (100 mg total) by mouth 2 (two) times daily for 7 days., Disp: 14 capsule, Rfl: 0   furosemide  (LASIX ) 20 MG tablet, Take 1 tablet (20 mg total) by mouth daily., Disp: 90 tablet, Rfl: 3   Loratadine (CLARITIN PO), Take by mouth daily., Disp: , Rfl:    montelukast  (SINGULAIR ) 10 MG tablet, TAKE 1 TABLET BY MOUTH EVERYDAY AT BEDTIME, Disp: 90 tablet, Rfl: 1   Semaglutide -Weight Management (WEGOVY ) 0.5 MG/0.5ML SOAJ, Inject 0.5 mg into the skin once a week., Disp: 2 mL,  Rfl: 0   Semaglutide -Weight Management (WEGOVY ) 0.5 MG/0.5ML SOAJ, Inject 0.5 mg into the skin once a week., Disp: 2 mL, Rfl: 0   Semaglutide -Weight Management (WEGOVY ) 1 MG/0.5ML SOAJ, Inject 1 mg into the skin once a week., Disp: 2 mL, Rfl: 0   triamcinolone cream (KENALOG) 0.1 %, Apply 1 Application topically 2 (two) times daily., Disp: 30 g, Rfl: 0   varenicline  (CHANTIX ) 1 MG tablet, Take 1 tablet (1 mg total) by mouth 2 (two) times daily., Disp: 60 tablet, Rfl: 3   Varenicline  Tartrate, Starter, (CHANTIX  STARTING MONTH PAK) 0.5 MG X 11 & 1 MG X 42 TBPK, Take one 0.5 mg tablet by mouth once daily for 3 days, then increase to one 0.5 mg tablet twice daily for 4 days, then increase to one 1 mg tablet twice daily., Disp: 53 each, Rfl: 0   VENTOLIN  HFA 108 (90 Base) MCG/ACT inhaler, INHALE 2 PUFFS INTO THE LUNGS EVERY 4 HOURS AS NEEDED FOR WHEEZING OR SHORTNESS OF BREATH., Disp: 18 each, Rfl: 2   Medications ordered in this encounter:  Meds ordered this encounter  Medications   predniSONE  (DELTASONE ) 20 MG tablet    Sig: Take 2 tablets (40 mg total) by mouth daily with breakfast for 5 days.    Dispense:  10 tablet    Refill:  0    Supervising Provider:   Corine Dice [phone]     *If you need refills on other medications prior to your  next appointment, please contact your pharmacy*  Follow-Up: Call back or seek an in-person evaluation if the symptoms worsen or if the condition fails to improve as anticipated.  [facility] [phone]  Other Instructions Take prednisone  as prescribed.  Follow up with primary care physician if no improvement.    If you have been instructed to have an in-person evaluation today at a local Urgent Care facility, please use the link below. It will take you to a list of all of our available [facility], including address, phone number and hours of operation. Please do not delay care.  [facility]  If you or a  family member do not have a primary care provider, use the link below to schedule a visit and establish care. When you choose a [facility] primary care physician or advanced practice provider, you gain a long-term partner in health. Find a Primary Care Provider  Learn more about [facility]'s in-office and virtual care options:  - Get Care Now

## 2023-08-10 NOTE — Telephone Encounter (Signed)
 Telehealth visit scheduled for today at 7:15pm.

## 2023-08-10 NOTE — Progress Notes (Signed)
 Virtual Visit Consent   Catherine Cannon, you are scheduled for a virtual visit with a [facility] provider today. Just as with appointments in the office, your consent must be obtained to participate. Your consent will be active for this visit and any virtual visit you may have with one of our providers in the next 365 days. If you have a MyChart account, a copy of this consent can be sent to you electronically.  As this is a virtual visit, video technology does not allow for your provider to perform a traditional examination. This may limit your provider's ability to fully assess your condition. If your provider identifies any concerns that need to be evaluated in person or the need to arrange testing (such as labs, EKG, etc.), we will make arrangements to do so. Although advances in technology are sophisticated, we cannot ensure that it will always work on either your end or our end. If the connection with a video visit is poor, the visit may have to be switched to a telephone visit. With either a video or telephone visit, we are not always able to ensure that we have a secure connection.  By engaging in this virtual visit, you consent to the provision of healthcare and authorize for your insurance to be billed (if applicable) for the services provided during this visit. Depending on your insurance coverage, you may receive a charge related to this service.  I need to obtain your verbal consent now. Are you willing to proceed with your visit today? Catherine Cannon has provided verbal consent on 08/10/2023 for a virtual visit (video or telephone). Char Common Ward, PA-C  Date: 08/10/2023 7:22 PM   Virtual Visit via Video Note   I, Char Common Ward, connected with  Catherine Cannon  ([id number], 1980-10-13) on 08/10/23 at  7:15 PM EDT by a video-enabled telemedicine application and verified that I am speaking with the correct person using two identifiers.  Location: Patient: Virtual Visit  Location Patient: Home Provider: Virtual Visit Location Provider: Home Office   I discussed the limitations of evaluation and management by telemedicine and the availability of in person appointments. The patient expressed understanding and agreed to proceed.    History of Present Illness: Catherine Cannon is a 43 y.o. who identifies as a female who was assigned female at birth, and is being seen today for rash to bilateral lower extremities.  She was seen in UC two days ago, started on antibiotic and given steroid cream.  Reports rash has gotten worse since then.  Mild swelling, but she reports often experiences swelling.  She reports a similar rash in the past, last summer, sx improved with prednisone .  HPI: HPI  Problems:  Patient Active Problem List   Diagnosis Date Noted   Prediabetes 02/05/2023   Depression, major, single episode, mild (HCC) 02/05/2023   Moderate persistent asthma 12/21/2022   Fatty liver disease, nonalcoholic 12/21/2022   Dyspnea 95/62/1308   History of MRSA infection 03/14/2018   Morbid obesity (HCC) 12/22/2017   Tobacco use 12/22/2017   Intramural uterine fibroid - Retroplacental  12/22/2017   Trichomonal vaginitis during pregnancy in second trimester 11/15/2017    Allergies: No Known Allergies Medications:  Current Outpatient Medications:    predniSONE  (DELTASONE ) 20 MG tablet, Take 2 tablets (40 mg total) by mouth daily with breakfast for 5 days., Disp: 10 tablet, Rfl: 0   acetaminophen  (TYLENOL ) 500 MG tablet, Take 500 mg by mouth every 4 (four) hours as  needed for mild pain., Disp: , Rfl:    albuterol  (PROVENTIL ) (2.5 MG/3ML) 0.083% nebulizer solution, Take 3 mLs (2.5 mg total) by nebulization every 6 (six) hours as needed for wheezing or shortness of breath., Disp: 75 mL, Rfl: 12   budesonide -formoterol  (SYMBICORT ) 160-4.5 MCG/ACT inhaler, Inhale 2 puffs into the lungs 2 (two) times daily., Disp: 10.2 g, Rfl: 3   buPROPion  (WELLBUTRIN  XL) 150 MG 24 hr  tablet, TAKE 1 TABLET BY MOUTH EVERY DAY, Disp: 90 tablet, Rfl: 2   doxycycline  (VIBRAMYCIN ) 100 MG capsule, Take 1 capsule (100 mg total) by mouth 2 (two) times daily for 7 days., Disp: 14 capsule, Rfl: 0   furosemide  (LASIX ) 20 MG tablet, Take 1 tablet (20 mg total) by mouth daily., Disp: 90 tablet, Rfl: 3   Loratadine (CLARITIN PO), Take by mouth daily., Disp: , Rfl:    montelukast  (SINGULAIR ) 10 MG tablet, TAKE 1 TABLET BY MOUTH EVERYDAY AT BEDTIME, Disp: 90 tablet, Rfl: 1   Semaglutide -Weight Management (WEGOVY ) 0.5 MG/0.5ML SOAJ, Inject 0.5 mg into the skin once a week., Disp: 2 mL, Rfl: 0   Semaglutide -Weight Management (WEGOVY ) 0.5 MG/0.5ML SOAJ, Inject 0.5 mg into the skin once a week., Disp: 2 mL, Rfl: 0   Semaglutide -Weight Management (WEGOVY ) 1 MG/0.5ML SOAJ, Inject 1 mg into the skin once a week., Disp: 2 mL, Rfl: 0   triamcinolone cream (KENALOG) 0.1 %, Apply 1 Application topically 2 (two) times daily., Disp: 30 g, Rfl: 0   varenicline  (CHANTIX ) 1 MG tablet, Take 1 tablet (1 mg total) by mouth 2 (two) times daily., Disp: 60 tablet, Rfl: 3   Varenicline  Tartrate, Starter, (CHANTIX  STARTING MONTH PAK) 0.5 MG X 11 & 1 MG X 42 TBPK, Take one 0.5 mg tablet by mouth once daily for 3 days, then increase to one 0.5 mg tablet twice daily for 4 days, then increase to one 1 mg tablet twice daily., Disp: 53 each, Rfl: 0   VENTOLIN  HFA 108 (90 Base) MCG/ACT inhaler, INHALE 2 PUFFS INTO THE LUNGS EVERY 4 HOURS AS NEEDED FOR WHEEZING OR SHORTNESS OF BREATH., Disp: 18 each, Rfl: 2  Observations/Objective: Patient is well-developed, well-nourished in no acute distress.  Resting comfortably  at home.  Head is normocephalic, atraumatic.  No labored breathing.  Speech is clear and coherent with logical content.  Patient is alert and oriented at baseline.    Assessment and Plan: 1. Rash (Primary)  Prednisone  prescribed.  Advised in person appointment with PCP if no improvement.   Follow Up  Instructions: I discussed the assessment and treatment plan with the patient. The patient was provided an opportunity to ask questions and all were answered. The patient agreed with the plan and demonstrated an understanding of the instructions.  A copy of instructions were sent to the patient via MyChart unless otherwise noted below.     The patient was advised to call back or seek an in-person evaluation if the symptoms worsen or if the condition fails to improve as anticipated.    Char Common Ward, PA-C

## 2023-08-11 NOTE — Telephone Encounter (Signed)
 Noted- ok to close.

## 2023-09-02 ENCOUNTER — Other Ambulatory Visit: Payer: Self-pay

## 2023-09-02 ENCOUNTER — Other Ambulatory Visit (HOSPITAL_BASED_OUTPATIENT_CLINIC_OR_DEPARTMENT_OTHER): Payer: Self-pay

## 2023-09-13 ENCOUNTER — Other Ambulatory Visit (HOSPITAL_BASED_OUTPATIENT_CLINIC_OR_DEPARTMENT_OTHER): Payer: Self-pay

## 2023-09-14 ENCOUNTER — Encounter: Payer: Self-pay | Admitting: Family Medicine

## 2023-09-14 MED ORDER — WEGOVY 0.5 MG/0.5ML ~~LOC~~ SOAJ: 0.5000 mg | SUBCUTANEOUS | 5 refills | Status: DC

## 2023-09-15 ENCOUNTER — Telehealth: Payer: Self-pay

## 2023-09-15 ENCOUNTER — Other Ambulatory Visit (HOSPITAL_COMMUNITY): Payer: Self-pay

## 2023-09-15 MED ORDER — WEGOVY 1 MG/0.5ML ~~LOC~~ SOAJ: 1.0000 mg | SUBCUTANEOUS | 0 refills | Status: AC

## 2023-09-15 NOTE — Telephone Encounter (Signed)
 Pharmacy Patient Advocate Encounter   Received notification from CoverMyMeds that prior authorization for Wegovy  0.5MG /0.5ML auto-injectors  is required/requested.   Insurance verification completed.   The patient is insured through [facility] MEDICAID .   Per test claim: PA required; PA submitted to above mentioned insurance via CoverMyMeds Key/confirmation #/EOC [id number] Status is pending

## 2023-09-15 NOTE — Telephone Encounter (Signed)
 Pharmacy Patient Advocate Encounter  Received notification from [facility] MEDICAID that Prior Authorization for Wegovy  0.5MG /0.5ML auto-injectors  has been DENIED.  See denial reason below. No denial letter attached in CMM. Will attach denial letter to Media tab once received.   PA #/Case ID/Reference #: [id number]

## 2023-09-20 NOTE — Telephone Encounter (Signed)
 They denied the wegovy  because she needs a visit so we can document her current weight. Then we can try to refill the medication for her. We need to show a 5% body weight loss.SABRASABRA

## 2023-09-21 NOTE — Telephone Encounter (Signed)
 Unable to reach patient via cell number due to message stating call cannot be completed as dialed.  Mychart message sent with the information below.

## 2023-11-03 ENCOUNTER — Other Ambulatory Visit: Payer: Self-pay | Admitting: Family Medicine

## 2023-11-03 DIAGNOSIS — J4540 Moderate persistent asthma, uncomplicated: Secondary | ICD-10-CM

## 2023-12-01 ENCOUNTER — Other Ambulatory Visit: Payer: Self-pay | Admitting: Family Medicine

## 2023-12-01 DIAGNOSIS — J4540 Moderate persistent asthma, uncomplicated: Secondary | ICD-10-CM

## 2023-12-25 ENCOUNTER — Other Ambulatory Visit: Payer: Self-pay

## 2023-12-25 ENCOUNTER — Ambulatory Visit: Admission: EM | Admit: 2023-12-25 | Discharge: 2023-12-25 | Disposition: A | Discharge status: Home / Self Care

## 2023-12-25 ENCOUNTER — Encounter: Payer: Self-pay | Admitting: *Deleted

## 2023-12-25 DIAGNOSIS — J45901 Unspecified asthma with (acute) exacerbation: Secondary | ICD-10-CM | POA: Diagnosis not present

## 2023-12-25 MED ORDER — METHYLPREDNISOLONE 4 MG PO TBPK: ORAL_TABLET | ORAL | 0 refills | Status: AC

## 2023-12-25 MED ORDER — GUAIFENESIN ER 600 MG PO TB12: 600.0000 mg | ORAL_TABLET | Freq: Two times a day (BID) | ORAL | 0 refills | Status: AC

## 2023-12-25 MED ORDER — BENZONATATE 100 MG PO CAPS: 100.0000 mg | ORAL_CAPSULE | Freq: Three times a day (TID) | ORAL | 0 refills | Status: AC

## 2023-12-25 NOTE — ED Triage Notes (Signed)
 Pt reports sinus congestion for a week. Cough started on Wednesday. Taking theraflu, dayquil. Nyquil, mucinex , singulair . Denies known fever

## 2023-12-25 NOTE — ED Provider Notes (Signed)
 [facility]    CSN: [id number] Arrival date & time: 12/25/23  1450      History   Chief Complaint Chief Complaint  Patient presents with   Cough    HPI Catherine Cannon is a 43 y.o. female.   Patient with a history of asthma, presents today due to nasal congestion for 1 week that has started to spread into her chest.  Patient states that she is experiencing cough productive of yellow-greenish sputum and mild shortness of breath.  States that she last used her inhaler about 2 hours ago.  Patient states she gets something similar every year and also reports tobacco use.  Patient denies fever or chills but does state that her appetite has decreased some.  States that she has been taking over-the-counter cold medicines with no significant relief.  The history is provided by the patient.  Cough   Past Medical History:  Diagnosis Date   Acute respiratory failure with hypoxia (HCC) 10/16/2017   ARDS (adult respiratory distress syndrome) (HCC) 10/19/2017   Heart failure (HCC)    Multifocal pneumonia 10/16/2017   Sepsis (HCC) 10/16/2017    Patient Active Problem List   Diagnosis Date Noted   Prediabetes 02/05/2023   Depression, major, single episode, mild 02/05/2023   Moderate persistent asthma 12/21/2022   Fatty liver disease, nonalcoholic 12/21/2022   Dyspnea 89/81/7977   History of MRSA infection 03/14/2018   Morbid obesity (HCC) 12/22/2017   Tobacco use 12/22/2017   Intramural uterine fibroid - Retroplacental  12/22/2017   Trichomonal vaginitis during pregnancy in second trimester 11/15/2017    Past Surgical History:  Procedure Laterality Date   NO PAST SURGERIES      OB History     Gravida  1   Para  1   Term  1   Preterm      AB      Living  1      SAB      IAB      Ectopic      Multiple  0   Live Births  1            Home Medications    Prior to Admission medications   Medication Sig Start Date End Date Taking?  Authorizing Provider  acetaminophen  (TYLENOL ) 500 MG tablet Take 500 mg by mouth every 4 (four) hours as needed for mild pain.   Yes [provider]  albuterol  (PROVENTIL ) (2.5 MG/3ML) 0.083% nebulizer solution Take 3 mLs (2.5 mg total) by nebulization every 6 (six) hours as needed for wheezing or shortness of breath. 08/09/22  Yes Fleming, Zelda W, NP  benzonatate  (TESSALON ) 100 MG capsule Take 1 capsule (100 mg total) by mouth every 8 (eight) hours. 12/25/23  Yes Andra Krabbe C, PA-C  budesonide -formoterol  (SYMBICORT ) 160-4.5 MCG/ACT inhaler Inhale 2 puffs into the lungs 2 (two) times daily. 12/07/22  Yes Ozell Heron HERO, MD  buPROPion  (WELLBUTRIN  XL) 150 MG 24 hr tablet TAKE 1 TABLET BY MOUTH EVERY DAY 03/01/23  Yes Ozell Heron HERO, MD  furosemide  (LASIX ) 20 MG tablet Take 1 tablet (20 mg total) by mouth daily. 09/21/22  Yes Alvan Ronal BRAVO, MD  guaiFENesin  (MUCINEX ) 600 MG 12 hr tablet Take 1 tablet (600 mg total) by mouth 2 (two) times daily for 10 doses. 12/25/23 12/30/23 Yes Andra Krabbe C, PA-C  methylPREDNISolone  (MEDROL  DOSEPAK) 4 MG TBPK tablet TAKE AS DIRECTED ON BACK OF PACKAGE 12/25/23  Yes Andra Krabbe BROCKS, PA-C  montelukast  (SINGULAIR ) 10 MG tablet TAKE 1 TABLET BY MOUTH EVERYDAY AT BEDTIME 12/01/23  Yes Ozell Heron HERO, MD  VENTOLIN  HFA 108 (90 Base) MCG/ACT inhaler INHALE 2 PUFFS INTO THE LUNGS EVERY 4 HOURS AS NEEDED FOR WHEEZING OR SHORTNESS OF BREATH. 03/01/23  Yes Ozell Heron HERO, MD  Loratadine (CLARITIN PO) Take by mouth daily. Patient not taking: Reported on 12/25/2023    [provider]  Semaglutide -Weight Management (WEGOVY ) 1 MG/0.5ML SOAJ Inject 1 mg into the skin once a week. Patient not taking: Reported on 12/25/2023 09/15/23   Ozell Heron HERO, MD  triamcinolone  cream (KENALOG ) 0.1 % Apply 1 Application topically 2 (two) times daily. Patient not taking: Reported on 12/25/2023 08/08/23   Billy Asberry FALCON, PA-C  varenicline  (CHANTIX ) 1  MG tablet Take 1 tablet (1 mg total) by mouth 2 (two) times daily. Patient not taking: Reported on 12/25/2023 12/07/22   Ozell Heron HERO, MD  Varenicline  Tartrate, Starter, (CHANTIX  STARTING MONTH PAK) 0.5 MG X 11 & 1 MG X 42 TBPK Take one 0.5 mg tablet by mouth once daily for 3 days, then increase to one 0.5 mg tablet twice daily for 4 days, then increase to one 1 mg tablet twice daily. Patient not taking: Reported on 12/25/2023 12/07/22   Ozell Heron HERO, MD    Family History Family History  Problem Relation Age of Onset   Asthma Mother    Asthma Brother     Social History Social History   Tobacco Use   Smoking status: Every Day    Current packs/day: 0.50    Types: Cigarettes    Passive exposure: Current   Smokeless tobacco: Never  Vaping Use   Vaping status: Never Used  Substance Use Topics   Alcohol use: Yes    Comment: occ    Drug use: Not Currently    Types: Cocaine    Comment: years ago     Allergies   Patient has no known allergies.   Review of Systems Review of Systems  Respiratory:  Positive for cough.      Physical Exam Triage Vital Signs ED Triage Vitals  Encounter Vitals Group     BP 12/25/23 1554 135/81     Girls Systolic BP Percentile --      Girls Diastolic BP Percentile --      Boys Systolic BP Percentile --      Boys Diastolic BP Percentile --      Pulse Rate 12/25/23 1554 84     Resp 12/25/23 1554 18     Temp 12/25/23 1554 97.9 F (36.6 C)     Temp Source 12/25/23 1554 Oral     SpO2 12/25/23 1554 94 %     Weight --      Height --      Head Circumference --      Peak Flow --      Pain Score 12/25/23 1552 5     Pain Loc --      Pain Education --      Exclude from Growth Chart --    No data found.  Updated Vital Signs BP 135/81 (BP Location: Right Arm)   Pulse 84   Temp 97.9 F (36.6 C) (Oral)   Resp 18   LMP 12/04/2023 (Approximate)   SpO2 94%   Visual Acuity Right Eye Distance:   Left Eye Distance:   Bilateral  Distance:    Right Eye Near:   Left Eye Near:  Bilateral Near:     Physical Exam Vitals and nursing note reviewed.  Constitutional:      General: She is not in acute distress.    Appearance: Normal appearance. She is not ill-appearing, toxic-appearing or diaphoretic.  Eyes:     General: No scleral icterus. Cardiovascular:     Rate and Rhythm: Normal rate and regular rhythm.     Heart sounds: Normal heart sounds.  Pulmonary:     Effort: Pulmonary effort is normal. No respiratory distress.     Breath sounds: Wheezing present. No rhonchi.     Comments: Mild wheezes throughout. Skin:    General: Skin is warm.  Neurological:     Mental Status: She is alert and oriented to person, place, and time.  Psychiatric:        Mood and Affect: Mood normal.        Behavior: Behavior normal.      UC Treatments / Results  Labs (all labs ordered are listed, but only abnormal results are displayed) Labs Reviewed - No data to display  EKG   Radiology No results found.  Procedures Procedures (including critical care time)  Medications Ordered in UC Medications - No data to display  Initial Impression / Assessment and Plan / UC Course  I have reviewed the triage vital signs and the nursing notes.  Pertinent labs & imaging results that were available during my care of the patient were reviewed by me and considered in my medical decision making (see chart for details).  Clinical Course as of 12/25/23 1613  Sat Dec 25, 2023  1612 96% pulse ox [SP]    Clinical Course User Index [SP] Andra Corean BROCKS, PA-C    Patient was diagnosed with asthma with acute exacerbation and prescribed a Medrol  Dosepak, Tessalon  100 mg 3 times daily as needed for cough, and Mucinex  600 mg twice daily for 10 days. Final Clinical Impressions(s) / UC Diagnoses   Final diagnoses:  Asthma with acute exacerbation, unspecified asthma severity, unspecified whether persistent   Discharge Instructions    None    ED Prescriptions     Medication Sig Dispense Auth. Provider   methylPREDNISolone  (MEDROL  DOSEPAK) 4 MG TBPK tablet TAKE AS DIRECTED ON BACK OF PACKAGE 21 tablet Fitzgerald Dunne C, PA-C   benzonatate  (TESSALON ) 100 MG capsule Take 1 capsule (100 mg total) by mouth every 8 (eight) hours. 30 capsule Andra Corean C, PA-C   guaiFENesin  (MUCINEX ) 600 MG 12 hr tablet Take 1 tablet (600 mg total) by mouth 2 (two) times daily for 10 doses. 10 tablet Andra Corean BROCKS, PA-C      PDMP not reviewed this encounter.   Andra Corean BROCKS, PA-C 12/25/23 1615

## 2024-01-08 ENCOUNTER — Other Ambulatory Visit: Payer: Self-pay | Admitting: Family Medicine

## 2024-01-08 DIAGNOSIS — J4540 Moderate persistent asthma, uncomplicated: Secondary | ICD-10-CM

## 2024-03-17 ENCOUNTER — Encounter: Payer: Self-pay | Admitting: Family Medicine

## 2024-03-29 ENCOUNTER — Ambulatory Visit: Payer: Self-pay | Admitting: Urgent Care

## 2024-03-29 ENCOUNTER — Ambulatory Visit (INDEPENDENT_AMBULATORY_CARE_PROVIDER_SITE_OTHER)

## 2024-03-29 ENCOUNTER — Ambulatory Visit
Admission: EM | Admit: 2024-03-29 | Discharge: 2024-03-29 | Disposition: A | Discharge status: Home / Self Care | Source: Home / Self Care

## 2024-03-29 DIAGNOSIS — M25561 Pain in right knee: Secondary | ICD-10-CM | POA: Diagnosis not present

## 2024-03-29 DIAGNOSIS — J329 Chronic sinusitis, unspecified: Secondary | ICD-10-CM | POA: Diagnosis not present

## 2024-03-29 DIAGNOSIS — H65191 Other acute nonsuppurative otitis media, right ear: Secondary | ICD-10-CM

## 2024-03-29 DIAGNOSIS — J40 Bronchitis, not specified as acute or chronic: Secondary | ICD-10-CM | POA: Diagnosis not present

## 2024-03-29 DIAGNOSIS — J4540 Moderate persistent asthma, uncomplicated: Secondary | ICD-10-CM | POA: Diagnosis not present

## 2024-03-29 MED ORDER — AMOXICILLIN-POT CLAVULANATE 875-125 MG PO TABS: 1.0000 | ORAL_TABLET | Freq: Two times a day (BID) | ORAL | 0 refills | Status: AC

## 2024-03-29 MED ORDER — PREDNISONE 20 MG PO TABS: ORAL_TABLET | ORAL | 0 refills | Status: AC

## 2024-03-29 MED ORDER — PROMETHAZINE-DM 6.25-15 MG/5ML PO SYRP: 5.0000 mL | ORAL_SOLUTION | Freq: Three times a day (TID) | ORAL | 0 refills | Status: AC | PRN

## 2024-03-29 MED ORDER — ALBUTEROL SULFATE HFA 108 (90 BASE) MCG/ACT IN AERS: 1.0000 | INHALATION_SPRAY | Freq: Four times a day (QID) | RESPIRATORY_TRACT | 0 refills | Status: AC | PRN

## 2024-03-29 NOTE — ED Provider Notes (Signed)
 " [facility]  Note:  This document was prepared using Conservation officer, historic buildings and may include unintentional dictation errors.  MRN: [id number] DOB: 05-31-1980  Subjective:   Catherine Cannon is a 44 y.o. female presenting for 2 chief complaints.  Reports 3-week history of persistent cough, chest congestion, sinus congestion, drainage, scratchy dry throat.  Is not having right ear pain.  Patient is a smoker.  Has a history of moderate persistent asthma.  Needs a refill on her inhaler. Reports 3-week history of intermittent right knee pain.  Symptoms started after she fell accidentally.  Her knee was recovering but in the past couple of days had a much more difficult time bearing weight.  Reports that she randomly started and heard a lot of pops on the medial aspect of her knee.  Has had to ambulate with a crutch.  No new injury.  Current Outpatient Medications  Medication Instructions   acetaminophen  (TYLENOL ) 500 mg, Every 4 hours PRN   albuterol  (PROVENTIL ) 2.5 mg, Nebulization, Every 6 hours PRN   benzonatate  (TESSALON ) 100 mg, Oral, Every 8 hours   budesonide -formoterol  (SYMBICORT ) 160-4.5 MCG/ACT inhaler 2 puffs, Inhalation, 2 times daily   buPROPion  (WELLBUTRIN  XL) 150 mg, Oral, Daily   furosemide  (LASIX ) 20 mg, Oral, Daily   Loratadine (CLARITIN PO) Daily   methylPREDNISolone  (MEDROL  DOSEPAK) 4 MG TBPK tablet TAKE AS DIRECTED ON BACK OF PACKAGE   montelukast  (SINGULAIR ) 10 MG tablet TAKE 1 TABLET BY MOUTH EVERYDAY AT BEDTIME   triamcinolone  cream (KENALOG ) 0.1 % 1 Application, Topical, 2 times daily   varenicline  (CHANTIX ) 1 mg, Oral, 2 times daily   Varenicline  Tartrate, Starter, (CHANTIX  STARTING MONTH PAK) 0.5 MG X 11 & 1 MG X 42 TBPK Take one 0.5 mg tablet by mouth once daily for 3 days, then increase to one 0.5 mg tablet twice daily for 4 days, then increase to one 1 mg tablet twice daily.   VENTOLIN  HFA 108 (90 Base) MCG/ACT inhaler 2  puffs, Inhalation, Every 4 hours PRN   Wegovy  1 mg, Subcutaneous, Weekly    Allergies[1]  Past Medical History:  Diagnosis Date   Acute respiratory failure with hypoxia (HCC) 10/16/2017   ARDS (adult respiratory distress syndrome) (HCC) 10/19/2017   Heart failure (HCC)    Multifocal pneumonia 10/16/2017   Sepsis (HCC) 10/16/2017     Past Surgical History:  Procedure Laterality Date   NO PAST SURGERIES      Family History  Problem Relation Age of Onset   Asthma Mother    Asthma Brother     Social History   Occupational History   Not on file  Tobacco Use   Smoking status: Every Day    Current packs/day: 0.50    Types: Cigarettes    Passive exposure: Current   Smokeless tobacco: Never  Vaping Use   Vaping status: Never Used  Substance and Sexual Activity   Alcohol use: Yes    Comment: occ    Drug use: Not Currently   Sexual activity: Yes    Birth control/protection: None     ROS   Objective:   Vitals: BP (!) 164/97 (BP Location: Left Arm)   Pulse 82   Temp 98.8 F (37.1 C) (Oral)   Resp 19   LMP 03/15/2024   SpO2 92%   Physical Exam Constitutional:      General: She is not in acute distress.    Appearance: Normal appearance. She is well-developed and normal  weight. She is not ill-appearing, toxic-appearing or diaphoretic.  HENT:     Head: Normocephalic and atraumatic.     Right Ear: Ear canal and external ear normal. No drainage or tenderness. No middle ear effusion. There is no impacted cerumen. Tympanic membrane is erythematous. Tympanic membrane is not bulging.     Left Ear: Tympanic membrane, ear canal and external ear normal. No drainage or tenderness.  No middle ear effusion. There is no impacted cerumen. Tympanic membrane is not erythematous or bulging.     Nose: Congestion present. No rhinorrhea.     Mouth/Throat:     Mouth: Mucous membranes are moist. No oral lesions.     Pharynx: Posterior oropharyngeal erythema present. No pharyngeal  swelling, oropharyngeal exudate or uvula swelling.     Tonsils: No tonsillar exudate or tonsillar abscesses.  Eyes:     General: No scleral icterus.       Right eye: No discharge.        Left eye: No discharge.     Extraocular Movements: Extraocular movements intact.     Right eye: Normal extraocular motion.     Left eye: Normal extraocular motion.     Conjunctiva/sclera: Conjunctivae normal.  Cardiovascular:     Rate and Rhythm: Normal rate and regular rhythm.     Heart sounds: Normal heart sounds. No murmur heard.    No friction rub. No gallop.  Pulmonary:     Effort: Pulmonary effort is normal. No respiratory distress.     Breath sounds: No stridor. Rhonchi (with coughing throughout) present. No wheezing or rales.  Chest:     Chest wall: No tenderness.  Musculoskeletal:     Cervical back: Normal range of motion and neck supple.     Right knee: Swelling and bony tenderness present. No deformity, effusion, erythema, ecchymosis, lacerations or crepitus. Normal range of motion. Tenderness present over the medial joint line and patellar tendon. No lateral joint line tenderness. Normal alignment and normal patellar mobility.  Lymphadenopathy:     Cervical: No cervical adenopathy.  Skin:    General: Skin is warm and dry.  Neurological:     General: No focal deficit present.     Mental Status: She is alert and oriented to person, place, and time.  Psychiatric:        Mood and Affect: Mood normal.        Behavior: Behavior normal.     Assessment and Plan :   PDMP not reviewed this encounter.  1. Acute pain of right knee   2. Other non-recurrent acute nonsuppurative otitis media of right ear   3. Sinobronchitis   4. Moderate persistent asthma without complication      Radiology overread pending.  Will update her later today, follow-up with an orthopedist.  For management of her sinobronchitis and otitis media of the right ear recommend Augmentin  and prednisone .  Refilled her  albuterol  inhaler.  Use supportive care otherwise.  Counseled patient on potential for adverse effects with medications prescribed/recommended today, ER and return-to-clinic precautions discussed, patient verbalized understanding.     [1] No Known Allergies    Christopher Savannah, [place] 03/29/24 1543  "

## 2024-03-29 NOTE — Discharge Instructions (Addendum)
 Start Augmentin  and prednisone  for sinobronchitis and your right ear infection. Use cough syrup as needed.   Will update you later today with your x-ray results. Follow up with an orthopedist.

## 2024-03-29 NOTE — ED Triage Notes (Addendum)
 Pt states she fell ~3 weeks ago-had minimal pain to right knee-did not seek medical treatment-pain to right knee when she stood yesterday-last dose ibuprofen  10am-NAD-limping gait with own crutch-pt also c/o cough x 3 weeks
